# Patient Record
Sex: Female | Born: 1965 | Race: Black or African American | Hispanic: No | Marital: Single | Smoking: Never smoker
Health system: Southern US, Community
[De-identification: ages and names within clinical notes are randomized; demographics above are authoritative.]

## PROBLEM LIST (undated history)

## (undated) DIAGNOSIS — E559 Vitamin D deficiency, unspecified: Secondary | ICD-10-CM

## (undated) DIAGNOSIS — I10 Essential (primary) hypertension: Secondary | ICD-10-CM

## (undated) DIAGNOSIS — E7800 Pure hypercholesterolemia, unspecified: Secondary | ICD-10-CM

## (undated) DIAGNOSIS — E119 Type 2 diabetes mellitus without complications: Secondary | ICD-10-CM

## (undated) DIAGNOSIS — D649 Anemia, unspecified: Secondary | ICD-10-CM

## (undated) DIAGNOSIS — F32A Depression, unspecified: Secondary | ICD-10-CM

## (undated) DIAGNOSIS — R011 Cardiac murmur, unspecified: Secondary | ICD-10-CM

## (undated) DIAGNOSIS — F411 Generalized anxiety disorder: Secondary | ICD-10-CM

## (undated) DIAGNOSIS — R21 Rash and other nonspecific skin eruption: Secondary | ICD-10-CM

## (undated) DIAGNOSIS — J309 Allergic rhinitis, unspecified: Secondary | ICD-10-CM

## (undated) DIAGNOSIS — M1990 Unspecified osteoarthritis, unspecified site: Secondary | ICD-10-CM

## (undated) DIAGNOSIS — R51 Headache: Secondary | ICD-10-CM

## (undated) DIAGNOSIS — K219 Gastro-esophageal reflux disease without esophagitis: Secondary | ICD-10-CM

## (undated) HISTORY — DX: Headache: R51

## (undated) HISTORY — DX: Pure hypercholesterolemia, unspecified: E78.00

## (undated) HISTORY — DX: Gastro-esophageal reflux disease without esophagitis: K21.9

## (undated) HISTORY — DX: Anemia, unspecified: D64.9

## (undated) HISTORY — DX: Unspecified osteoarthritis, unspecified site: M19.90

## (undated) HISTORY — DX: Cardiac murmur, unspecified: R01.1

## (undated) HISTORY — DX: Vitamin D deficiency, unspecified: E55.9

## (undated) HISTORY — DX: Depression, unspecified: F32.A

## (undated) HISTORY — DX: Allergic rhinitis, unspecified: J30.9

## (undated) HISTORY — DX: Rash and other nonspecific skin eruption: R21

## (undated) HISTORY — DX: Generalized anxiety disorder: F41.1

---

## 1988-02-12 HISTORY — PX: TENDON REPAIR: SHX5111

## 1997-11-05 ENCOUNTER — Encounter: Payer: Self-pay | Admitting: Emergency Medicine

## 1997-11-05 ENCOUNTER — Emergency Department (HOSPITAL_COMMUNITY): Admission: EM | Admit: 1997-11-05 | Discharge: 1997-11-05 | Payer: Self-pay | Admitting: Emergency Medicine

## 2000-02-22 ENCOUNTER — Encounter: Payer: Self-pay | Admitting: Emergency Medicine

## 2000-02-22 ENCOUNTER — Emergency Department (HOSPITAL_COMMUNITY): Admission: EM | Admit: 2000-02-22 | Discharge: 2000-02-22 | Payer: Self-pay | Admitting: Emergency Medicine

## 2000-04-02 ENCOUNTER — Other Ambulatory Visit: Admission: RE | Admit: 2000-04-02 | Discharge: 2000-04-02 | Payer: Self-pay | Admitting: Gastroenterology

## 2000-04-02 ENCOUNTER — Encounter (INDEPENDENT_AMBULATORY_CARE_PROVIDER_SITE_OTHER): Payer: Self-pay | Admitting: Specialist

## 2000-05-05 ENCOUNTER — Other Ambulatory Visit: Admission: RE | Admit: 2000-05-05 | Discharge: 2000-05-05 | Payer: Self-pay | Admitting: Obstetrics and Gynecology

## 2002-05-26 ENCOUNTER — Emergency Department (HOSPITAL_COMMUNITY): Admission: EM | Admit: 2002-05-26 | Discharge: 2002-05-26 | Payer: Self-pay

## 2003-01-16 ENCOUNTER — Emergency Department (HOSPITAL_COMMUNITY): Admission: EM | Admit: 2003-01-16 | Discharge: 2003-01-16 | Payer: Self-pay | Admitting: Emergency Medicine

## 2003-10-19 ENCOUNTER — Emergency Department (HOSPITAL_COMMUNITY): Admission: EM | Admit: 2003-10-19 | Discharge: 2003-10-19 | Payer: Self-pay | Admitting: Emergency Medicine

## 2004-02-09 ENCOUNTER — Ambulatory Visit: Payer: Self-pay | Admitting: Pulmonary Disease

## 2004-07-23 ENCOUNTER — Ambulatory Visit: Payer: Self-pay | Admitting: Pulmonary Disease

## 2004-07-25 ENCOUNTER — Ambulatory Visit: Payer: Self-pay | Admitting: Pulmonary Disease

## 2004-09-17 ENCOUNTER — Emergency Department (HOSPITAL_COMMUNITY): Admission: EM | Admit: 2004-09-17 | Discharge: 2004-09-17 | Payer: Self-pay | Admitting: Emergency Medicine

## 2004-10-22 ENCOUNTER — Ambulatory Visit: Payer: Self-pay | Admitting: Pulmonary Disease

## 2004-10-29 ENCOUNTER — Ambulatory Visit: Payer: Self-pay | Admitting: Pulmonary Disease

## 2004-10-30 ENCOUNTER — Ambulatory Visit: Payer: Self-pay | Admitting: Internal Medicine

## 2004-11-14 ENCOUNTER — Ambulatory Visit: Payer: Self-pay | Admitting: Pulmonary Disease

## 2004-12-26 ENCOUNTER — Ambulatory Visit: Payer: Self-pay | Admitting: Pulmonary Disease

## 2005-01-15 ENCOUNTER — Emergency Department (HOSPITAL_COMMUNITY): Admission: EM | Admit: 2005-01-15 | Discharge: 2005-01-15 | Payer: Self-pay | Admitting: Emergency Medicine

## 2005-12-23 ENCOUNTER — Ambulatory Visit: Payer: Self-pay

## 2006-01-24 ENCOUNTER — Emergency Department (HOSPITAL_COMMUNITY): Admission: EM | Admit: 2006-01-24 | Discharge: 2006-01-24 | Payer: Self-pay | Admitting: Emergency Medicine

## 2006-11-04 ENCOUNTER — Ambulatory Visit: Payer: Self-pay | Admitting: Pulmonary Disease

## 2006-12-11 DIAGNOSIS — K219 Gastro-esophageal reflux disease without esophagitis: Secondary | ICD-10-CM

## 2006-12-11 DIAGNOSIS — F411 Generalized anxiety disorder: Secondary | ICD-10-CM

## 2006-12-11 DIAGNOSIS — J309 Allergic rhinitis, unspecified: Secondary | ICD-10-CM | POA: Insufficient documentation

## 2007-01-06 ENCOUNTER — Telehealth (INDEPENDENT_AMBULATORY_CARE_PROVIDER_SITE_OTHER): Payer: Self-pay | Admitting: *Deleted

## 2007-02-26 ENCOUNTER — Ambulatory Visit: Payer: Self-pay | Admitting: Pulmonary Disease

## 2007-03-05 ENCOUNTER — Telehealth: Payer: Self-pay | Admitting: Adult Health

## 2007-03-23 ENCOUNTER — Telehealth (INDEPENDENT_AMBULATORY_CARE_PROVIDER_SITE_OTHER): Payer: Self-pay | Admitting: *Deleted

## 2007-03-25 ENCOUNTER — Ambulatory Visit: Payer: Self-pay | Admitting: Pulmonary Disease

## 2007-06-18 ENCOUNTER — Telehealth (INDEPENDENT_AMBULATORY_CARE_PROVIDER_SITE_OTHER): Payer: Self-pay | Admitting: *Deleted

## 2007-06-25 ENCOUNTER — Telehealth: Payer: Self-pay | Admitting: Pulmonary Disease

## 2007-08-03 ENCOUNTER — Ambulatory Visit: Payer: Self-pay | Admitting: Pulmonary Disease

## 2007-11-09 ENCOUNTER — Ambulatory Visit: Payer: Self-pay | Admitting: Internal Medicine

## 2007-12-01 ENCOUNTER — Encounter (INDEPENDENT_AMBULATORY_CARE_PROVIDER_SITE_OTHER): Payer: Self-pay | Admitting: *Deleted

## 2007-12-17 ENCOUNTER — Telehealth (INDEPENDENT_AMBULATORY_CARE_PROVIDER_SITE_OTHER): Payer: Self-pay | Admitting: *Deleted

## 2007-12-22 ENCOUNTER — Telehealth (INDEPENDENT_AMBULATORY_CARE_PROVIDER_SITE_OTHER): Payer: Self-pay | Admitting: *Deleted

## 2007-12-25 ENCOUNTER — Ambulatory Visit: Payer: Self-pay | Admitting: Pulmonary Disease

## 2008-01-27 ENCOUNTER — Telehealth (INDEPENDENT_AMBULATORY_CARE_PROVIDER_SITE_OTHER): Payer: Self-pay | Admitting: *Deleted

## 2008-02-08 ENCOUNTER — Ambulatory Visit: Payer: Self-pay | Admitting: Pulmonary Disease

## 2008-02-08 DIAGNOSIS — R519 Headache, unspecified: Secondary | ICD-10-CM | POA: Insufficient documentation

## 2008-02-08 DIAGNOSIS — R51 Headache: Secondary | ICD-10-CM

## 2008-02-15 ENCOUNTER — Telehealth: Payer: Self-pay | Admitting: Adult Health

## 2008-02-16 ENCOUNTER — Ambulatory Visit: Payer: Self-pay | Admitting: Pulmonary Disease

## 2008-02-16 ENCOUNTER — Encounter: Payer: Self-pay | Admitting: Adult Health

## 2008-02-17 ENCOUNTER — Telehealth (INDEPENDENT_AMBULATORY_CARE_PROVIDER_SITE_OTHER): Payer: Self-pay | Admitting: *Deleted

## 2008-02-22 ENCOUNTER — Encounter: Payer: Self-pay | Admitting: Adult Health

## 2008-02-22 ENCOUNTER — Telehealth (INDEPENDENT_AMBULATORY_CARE_PROVIDER_SITE_OTHER): Payer: Self-pay | Admitting: *Deleted

## 2008-03-01 ENCOUNTER — Encounter: Payer: Self-pay | Admitting: Pulmonary Disease

## 2008-03-17 ENCOUNTER — Telehealth: Payer: Self-pay | Admitting: Pulmonary Disease

## 2008-08-11 ENCOUNTER — Telehealth (INDEPENDENT_AMBULATORY_CARE_PROVIDER_SITE_OTHER): Payer: Self-pay | Admitting: *Deleted

## 2008-08-16 ENCOUNTER — Ambulatory Visit: Payer: Self-pay | Admitting: Pulmonary Disease

## 2008-08-18 ENCOUNTER — Ambulatory Visit: Payer: Self-pay | Admitting: Pulmonary Disease

## 2008-08-18 DIAGNOSIS — E785 Hyperlipidemia, unspecified: Secondary | ICD-10-CM | POA: Insufficient documentation

## 2008-08-18 DIAGNOSIS — E7800 Pure hypercholesterolemia, unspecified: Secondary | ICD-10-CM

## 2008-08-18 DIAGNOSIS — E559 Vitamin D deficiency, unspecified: Secondary | ICD-10-CM

## 2008-08-18 DIAGNOSIS — D649 Anemia, unspecified: Secondary | ICD-10-CM | POA: Insufficient documentation

## 2008-08-18 LAB — CONVERTED CEMR LAB
ALT: 18 units/L (ref 0–35)
AST: 16 units/L (ref 0–37)
Albumin: 3.9 g/dL (ref 3.5–5.2)
Alkaline Phosphatase: 38 units/L — ABNORMAL LOW (ref 39–117)
BUN: 7 mg/dL (ref 6–23)
Basophils Absolute: 0.1 10*3/uL (ref 0.0–0.1)
Basophils Relative: 1.3 % (ref 0.0–3.0)
Bilirubin, Direct: 0.1 mg/dL (ref 0.0–0.3)
CO2: 30 meq/L (ref 19–32)
Calcium: 9.2 mg/dL (ref 8.4–10.5)
Chloride: 104 meq/L (ref 96–112)
Cholesterol: 205 mg/dL — ABNORMAL HIGH (ref 0–200)
Creatinine, Ser: 0.6 mg/dL (ref 0.4–1.2)
Direct LDL: 143.4 mg/dL
Eosinophils Absolute: 0.4 10*3/uL (ref 0.0–0.7)
Eosinophils Relative: 7.4 % — ABNORMAL HIGH (ref 0.0–5.0)
GFR calc non Af Amer: 138.99 mL/min (ref >60)
Glucose, Bld: 98 mg/dL (ref 70–99)
HCT: 35.8 % — ABNORMAL LOW (ref 36.0–46.0)
HDL: 53.2 mg/dL (ref >39.00)
Hemoglobin: 11.9 g/dL — ABNORMAL LOW (ref 12.0–15.0)
Lymphocytes Relative: 40.5 % (ref 12.0–46.0)
Lymphs Abs: 2.3 10*3/uL (ref 0.7–4.0)
MCHC: 33.3 g/dL (ref 30.0–36.0)
MCV: 83.5 fL (ref 78.0–100.0)
Monocytes Absolute: 0.5 10*3/uL (ref 0.1–1.0)
Monocytes Relative: 7.9 % (ref 3.0–12.0)
Neutro Abs: 2.5 10*3/uL (ref 1.4–7.7)
Neutrophils Relative %: 42.9 % — ABNORMAL LOW (ref 43.0–77.0)
Platelets: 369 10*3/uL (ref 150.0–400.0)
Potassium: 4.1 meq/L (ref 3.5–5.1)
RBC: 4.29 M/uL (ref 3.87–5.11)
RDW: 12.5 % (ref 11.5–14.6)
Sodium: 140 meq/L (ref 135–145)
TSH: 1.09 microintl units/mL (ref 0.35–5.50)
Total Bilirubin: 1 mg/dL (ref 0.3–1.2)
Total CHOL/HDL Ratio: 4
Total Protein: 7.2 g/dL (ref 6.0–8.3)
Triglycerides: 55 mg/dL (ref 0.0–149.0)
VLDL: 11 mg/dL (ref 0.0–40.0)
Vit D, 25-Hydroxy: 10 ng/mL — ABNORMAL LOW (ref 30–89)
WBC: 5.8 10*3/uL (ref 4.5–10.5)

## 2008-09-30 ENCOUNTER — Telehealth: Payer: Self-pay | Admitting: Internal Medicine

## 2008-10-03 ENCOUNTER — Telehealth: Payer: Self-pay | Admitting: Adult Health

## 2009-04-06 ENCOUNTER — Telehealth (INDEPENDENT_AMBULATORY_CARE_PROVIDER_SITE_OTHER): Payer: Self-pay | Admitting: *Deleted

## 2009-05-11 ENCOUNTER — Telehealth: Payer: Self-pay | Admitting: Pulmonary Disease

## 2009-05-15 ENCOUNTER — Encounter: Payer: Self-pay | Admitting: Pulmonary Disease

## 2009-05-15 ENCOUNTER — Telehealth: Payer: Self-pay | Admitting: Pulmonary Disease

## 2009-05-17 ENCOUNTER — Encounter: Payer: Self-pay | Admitting: Pulmonary Disease

## 2009-05-17 ENCOUNTER — Telehealth: Payer: Self-pay | Admitting: Pulmonary Disease

## 2009-06-14 ENCOUNTER — Telehealth: Payer: Self-pay | Admitting: Pulmonary Disease

## 2009-07-24 ENCOUNTER — Telehealth: Payer: Self-pay | Admitting: Pulmonary Disease

## 2009-08-18 ENCOUNTER — Ambulatory Visit: Payer: Self-pay | Admitting: Pulmonary Disease

## 2009-08-18 ENCOUNTER — Encounter: Payer: Self-pay | Admitting: Pulmonary Disease

## 2009-08-24 ENCOUNTER — Encounter: Payer: Self-pay | Admitting: Pulmonary Disease

## 2009-08-24 ENCOUNTER — Telehealth: Payer: Self-pay | Admitting: Adult Health

## 2009-08-24 ENCOUNTER — Telehealth (INDEPENDENT_AMBULATORY_CARE_PROVIDER_SITE_OTHER): Payer: Self-pay | Admitting: *Deleted

## 2009-08-28 ENCOUNTER — Encounter: Payer: Self-pay | Admitting: Adult Health

## 2009-08-28 ENCOUNTER — Telehealth (INDEPENDENT_AMBULATORY_CARE_PROVIDER_SITE_OTHER): Payer: Self-pay | Admitting: *Deleted

## 2009-11-06 ENCOUNTER — Telehealth (INDEPENDENT_AMBULATORY_CARE_PROVIDER_SITE_OTHER): Payer: Self-pay | Admitting: *Deleted

## 2009-11-06 ENCOUNTER — Encounter: Payer: Self-pay | Admitting: Pulmonary Disease

## 2009-11-20 ENCOUNTER — Encounter: Payer: Self-pay | Admitting: Pulmonary Disease

## 2009-12-01 ENCOUNTER — Encounter: Payer: Self-pay | Admitting: Pulmonary Disease

## 2009-12-18 ENCOUNTER — Telehealth (INDEPENDENT_AMBULATORY_CARE_PROVIDER_SITE_OTHER): Payer: Self-pay | Admitting: *Deleted

## 2009-12-27 ENCOUNTER — Telehealth: Payer: Self-pay | Admitting: Pulmonary Disease

## 2010-02-14 ENCOUNTER — Ambulatory Visit: Admit: 2010-02-14 | Payer: Self-pay | Admitting: Pulmonary Disease

## 2010-02-15 ENCOUNTER — Telehealth: Payer: Self-pay | Admitting: Pulmonary Disease

## 2010-02-22 ENCOUNTER — Telehealth (INDEPENDENT_AMBULATORY_CARE_PROVIDER_SITE_OTHER): Payer: Self-pay | Admitting: *Deleted

## 2010-03-13 NOTE — Letter (Signed)
Summary: Out of Work  [facility]  [address]   [place], [place] [address]   Phone: [phone]  Fax: [phone]    August 24, 2009   Employee:  Margaretha Glassing A Gertz    To Whom It May Concern:   For Medical reasons, please excuse the above named employee from work for the following dates:  Start:   08-22-2009  End:   08-22-2009  If you need additional information, please feel free to contact our office.         Sincerely,      Rubye Oaks, NP

## 2010-03-13 NOTE — Letter (Signed)
Summary: Out of Work  [facility]  [address]   [place], [place] [address]   Phone: [phone]  Fax: [phone]    November 06, 2009   Employee:  Brenda [name]    To Whom It May Concern:   For Medical reasons, please excuse the above named employee from work for the following date(s):    Monday November 06, 2009    Pt may return to work Tuesday November 07, 2009.    If you need additional information, please feel free to contact our office.         Sincerely,    Alroy Dust, M.D.  Appended Document: Out of Work per SN-----pt never returned for her fasting labs after her 08-21-2009 ov-----no more doctorns letters for out of work---she will require ov for all her complaints per SN

## 2010-03-13 NOTE — Letter (Signed)
Summary: Out of Work  [facility]  [address]   [place], [place] [address]   Phone: [phone]  Fax: [phone]    May 15, 2009   Employee:  Margaretha Glassing A Wiltsey    To Whom It May Concern:   For Medical reasons, please excuse the above named employee from work for the following dates:  Start:   05/15/09  End:   pt may return to work on 05/17/09  If you need additional information, please feel free to contact our office.         Sincerely,   Alroy Dust, MD

## 2010-03-13 NOTE — Progress Notes (Signed)
Summary: pt requesting tussionex refill  Phone Note From Pharmacy   Caller: [facility]  [address] # [id number]* Summary of Call: pharmacy sent a fax and stated that the pt is requesting refill of the tussionex that was sent in on 10-03-08, pharmacy stated that she never picked this up so they need a new rx for this.  is this ok to call in for the $4oz  1 tsp every 12 hours as needed for cough?  please advise.  thanks Randell Loop CMA  August 24, 2009 10:22 AM   Follow-up for Phone Call        yes that is fine.  tussionex #4oz 1 tsp two times a day as needed cough, may make you sleepy, no refills  Follow-up by: Tammy Parrett NP,  August 24, 2009 10:49 AM    New/Updated Medications: Sandria Senter ER 8-10 MG/5ML LQCR (CHLORPHENIRAMINE-HYDROCODONE) 1 tsp two times a day as needed for cough.  May make you sleepy Prescriptions: TUSSIONEX PENNKINETIC ER 8-10 MG/5ML LQCR (CHLORPHENIRAMINE-HYDROCODONE) 1 tsp two times a day as needed for cough.  May make you sleepy  #4 oz x o   Entered by:   Arman Filter LPN   Authorized by:   Rubye Oaks NP   Signed by:   Arman Filter LPN on 16/11/9602   Method used:   Telephoned to ...       [facility]  [address] # [id number]* (retail)       [address]       [place], [place]  [address]       Ph: [phone] or [phone]       Fax: [phone]   RxID:   [id number]

## 2010-03-13 NOTE — Progress Notes (Signed)
Summary: congested  Phone Note Call from Patient Call back at [phone]   Caller: Patient Call For: Tanganika Barradas Summary of Call: pt congested sore throat headache [facility] [address] Initial call taken by: Rickard Patience,  May 11, 2009 9:25 AM  Follow-up for Phone Call        called, pt back at number provided aobve - [phone] - Left message for pt to call office back.  Called pt's home number - no answer and unable to leave message.  Called pt's work number - [employer] Jones RN  May 11, 2009 9:37 AM  pt returned call. call her at [phone] today. Tivis Ringer, CNA  May 12, 2009 9:16 AM   Additional Follow-up for Phone Call Additional follow up Details #1::        pt c/o chest congestion, productive cough with green phlegm, head congestion, headache, and ear ache x 3 days. States she has been taking mucinex with no relief. Please advise. Carron Curie CMA  May 12, 2009 9:20 AM allergies: codeine    Additional Follow-up for Phone Call Additional follow up Details #2::    per SN----ok for pt to have augmentin 875mg   #14   1 by mouth two times a day  and cont the mucinex 2 by mouth two times a day with fluids  and use the nasal saline spray as needed .  thanks Randell Loop CMA  May 12, 2009 12:11 PM   rx sent. pt advised of recs. Carron Curie CMA  May 12, 2009 12:15 PM   New/Updated Medications: AUGMENTIN 875-125 MG TABS (AMOXICILLIN-POT CLAVULANATE) Take 1 tablet by mouth two times a day Prescriptions: AUGMENTIN 875-125 MG TABS (AMOXICILLIN-POT CLAVULANATE) Take 1 tablet by mouth two times a day  #14 x 0   Entered by:   Carron Curie CMA   Authorized by:   Michele Mcalpine MD   Signed by:   Carron Curie CMA on 05/12/2009   Method used:   Electronically to        [facility]  [address] # [id number]* (retail)       [address]       [place], [place]  [address]       Ph: [phone] or [phone]       Fax: [phone]   RxID:    [id number]

## 2010-03-13 NOTE — Progress Notes (Signed)
Summary: OV to fill out FMLA forms  Phone Note Call from Patient Call back at [phone]   Caller: Patient Call For: nadel Summary of Call: Wants to know the status of her FMLA papers. Initial call taken by: [name],  December 18, 2009 9:35 AM  Follow-up for Phone Call        spoke with [employer] and they state they sent FMLA paperwork to SN on 12-06-09. The FMLA is under pt name Brenda [name] for [name] to care for her. Leigh please advise on status of these forms. Thanks. Carron Curie CMA  December 18, 2009 10:37 AM   Additional Follow-up for Phone Call Additional follow up Details #1::        she will need ov for these to be filled out. thanks Randell Loop CMA  December 18, 2009 10:43 AM  Please advise on dates and times pt can be worked in with SN.Michel Bickers [facility]  December 18, 2009 11:04 AM 11-15 at 3:30 thanks Randell Loop [facility]  December 18, 2009 11:18 AM     Additional Follow-up for Phone Call Additional follow up Details #2::    LMOMTCB. Need to sch appt for FMLA papers.Michel Bickers CMA  December 18, 2009 11:30 AM LMTCBx2 to set appt to see SN to have paperwork completd. Carron Curie CMA  December 19, 2009 11:36 AM  LMTCbx3. pt needs appt with SN. Carron Curie CMA  December 20, 2009 10:37 AM   The patient is aware of date and time of appt with SN to have FMLA forms filled out Tues., 12/26/2009 @ 3:30pm w/ SN.Marland KitchenMichel Bickers [facility]  December 20, 2009 10:47 AM

## 2010-03-13 NOTE — Progress Notes (Signed)
Summary: rx's denied -  Phone Note Refill Request Call back at Home Phone [phone] Message from:  Patient on July 24, 2009 3:53 PM  Refills Requested: Medication #1:  PRAVASTATIN SODIUM 40 MG TABS take 1 tab by mouth at bedtime for cholesterol...   Dosage confirmed as above?Dosage Confirmed   Supply Requested: 23 days   Notes: telephoned  Medication #2:  XANAX 0.5 MG  TABS take one tablet two times a day as needed for nerves...   Dosage confirmed as above?Dosage Confirmed   Supply Requested: 23 days   Notes: telephoned pt called asking why her scripts were denied.  pt last seen 08-2008 with multiple noshows since.  informed pt why rx's were denied.  appt made w/ SN 07-27-09 @ 1530 with the understanding that her meds will be denied again if she misses the appt.  pt verbalized her understanding.  Next Appointment Scheduled: 07-27-09 Initial call taken by: Boone Master CNA/MA,  July 24, 2009 3:56 PM  Follow-up for Phone Call        pt called again, states that she cannot make it on thurs b/c her job already has 2 people off on that day.  pt states she can come any day next week, any time if we can resch.  will forward to leigh. Boone Master CNA/MA  July 24, 2009 3:57 PM   per TD, okay for 08-17-09 @ 1530.  LMOM TCB. Boone Master CNA/MA  July 24, 2009 4:04 PM   lmtcb Vernie Murders  July 25, 2009 1:24 PM   Additional Follow-up for Phone Call Additional follow up Details #1::        pt returned  call to Rickard Patience, scheduler, stating that she will NOT be able to make the appt next week.  per TD, no appt no refills.  [phone]. Boone Master CNA/MA  July 25, 2009 2:52 PM   Pt states she was not advised of 7-7 appt and this appt will be fine for her. So pt scheduled for 08-17-09 appt at 3:30. I called pharmacy and advised that we need to change refills for enough to last until 08-17-09 appt and no extra. Pharmacists changed refill amount to last until 08-17-09.  Carron Curie CMA   July 25, 2009 3:07 PM

## 2010-03-13 NOTE — Progress Notes (Signed)
Summary: refill  Phone Note Call from Patient Call back at [phone]   Caller: Patient Call For: nadel Summary of Call: Pt wants a refill on alprazolam 0.5mg , made an appt. for May, said she can't come in before then, because she just started a new job and her probationary period won't be up until 4/6. Please advise.[facility] [address] Initial call taken by: [name],  April 06, 2009 8:15 AM  Follow-up for Phone Call        please advise if ok to send in refill. Carron Curie CMA  April 06, 2009 9:53 AM   ok to fill the rx for alprazolam this time with 2 refills.  she must keep her appt in may or the med will not be able to be refilled.  thanks Randell Loop CMA  April 06, 2009 10:55 AM   rx sent.  LMTCB to advised pt must keep f/u appt. Carron Curie CMA  April 06, 2009 11:05 AM   Additional Follow-up for Phone Call Additional follow up Details #1::        lmam that meds had been called in and that she must keep her appt in may to get more refills and told to call with questions Additional Follow-up by: Lacinda Axon,  April 06, 2009 2:25 PM    Prescriptions: XANAX 0.5 MG  TABS (ALPRAZOLAM) take one tablet two times a day as needed for nerves...  #60 x 2   Entered by:   Carron Curie CMA   Authorized by:   Michele Mcalpine MD   Signed by:   Carron Curie CMA on 04/06/2009   Method used:   Telephoned to ...       [facility]  [address] # [id number]* (retail)       [address]       [place], [place]  [address]       Ph: [phone] or [phone]       Fax: [phone]   RxID:   [id number]

## 2010-03-13 NOTE — Progress Notes (Signed)
Summary: nos appt  Phone Note Call from Patient   Caller: [email or web address]  Call For: Sarely Stracener Summary of Call: In ref to nos from 11/15, pt has appt for 02/14/2010. Initial call taken by: [name],  December 27, 2009 9:05 AM

## 2010-03-13 NOTE — Progress Notes (Signed)
Summary: note/ missed work days  Phone Note Call from Patient   Caller: Patient Call For: tammy parrett Summary of Call: needs an "excuse from work" note faxed. (needs this for last thurs and fri). i asked pt 3 x what # to fax this to and where it was going but she kept telling me that "they already have this information". pt # [phone] Initial call taken by: Tivis Ringer, CNA,  August 28, 2009 12:20 PM  Follow-up for Phone Call        TP---pt called and we did a note for her on 7-12 and she stated that she was going back to work on wed.  now she is calling and needs another note for thursday and friday...is this ok to do??  please advise. thanks Randell Loop CMA  August 28, 2009 1:37 PM   Additional Follow-up for Phone Call Additional follow up Details #1::        yes Additional Follow-up by: Rubye Oaks NP,  August 28, 2009 1:50 PM    Additional Follow-up for Phone Call Additional follow up Details #2::    work note faxed to [phone].  pt aware. Arman Filter LPN  August 28, 2009 2:36 PM

## 2010-03-13 NOTE — Progress Notes (Signed)
Summary: rsc nos appt  Phone Note Call from Patient   Caller: [email or web address]  Call For: Lamel Mccarley Summary of Call: ATC pt home phone is disconnected. Called her daughter (contact number) and LMOMTCB. Initial call taken by: [name],  Jun 14, 2009 2:21 PM

## 2010-03-13 NOTE — Progress Notes (Signed)
Summary: diarrhea/vomiting--rx for lomotil and phenergan  Phone Note Call from Patient Call back at [phone]   Caller: Patient Call For: nadel Reason for Call: Talk to Nurse Summary of Call: Pt c/o of diarrhea, throwing up since Saturday, has taken Imodium AD, isn't working wants something called in, pls advise.[facility] Initial call taken by: [name],  November 06, 2009 8:26 AM  Follow-up for Phone Call        called and spoke with pt.  pt states she has been throwing up and having diarrhea since Saturday 11/04/2009.  Pt has been taking Imodium AD with no relief of symptoms.  Pt states she feels "drained."  Pt has been trying to drink liquids but still unable to keep anything down.  Pt unsure if she has a fever or not.  Pt requests rx to help with diarrhea/nausea/vomiting.  Pt also requests a doctors note as she was unable to go into work today. Please advise  Arman Filter LPN  November 06, 2009 9:17 AM   allergies: codeine  Additional Follow-up for Phone Call Additional follow up Details #1::        per SN---phenergan suppos  25mg   #12   1 inserted every 4 hours as needed for nausea and lomotil  #12  1 by mouth every 4 hours as needed for watery diarrhea.  thanks. also ok for dr note. Randell Loop [facility]  November 06, 2009 11:25 AM     Additional Follow-up for Phone Call Additional follow up Details #2::    called and spoke with pt.  pt aware rx sent to pharmacy.  and dr note faxed to her work. Aundra Millet Reynolds LPN  November 06, 2009 11:27 AM   New/Updated Medications: PROMETHAZINE HCL 25 MG SUPP (PROMETHAZINE HCL) insert 1 every 4 hours as needed for nausea LOMOTIL 2.5-0.025 MG TABS (DIPHENOXYLATE-ATROPINE) 1 every 4 hours as needed for watery diarrhea Prescriptions: LOMOTIL 2.5-0.025 MG TABS (DIPHENOXYLATE-ATROPINE) 1 every 4 hours as needed for watery diarrhea  #12 x 0   Entered by:   Arman Filter LPN   Authorized by:   Michele Mcalpine MD   Signed by:   Arman Filter LPN on 56/21/3086   Method used:   Telephoned to ...       [facility]  [address] # 11350* (retail)       [address]       [place], [place]  [address]       Ph: [phone] or [phone]       Fax: [phone]   RxID:   [id number] PROMETHAZINE HCL 25 MG SUPP (PROMETHAZINE HCL) insert 1 every 4 hours as needed for nausea  #12 x 0   Entered by:   Arman Filter LPN   Authorized by:   Michele Mcalpine MD   Signed by:   Arman Filter LPN on 18/84/1660   Method used:   Telephoned to ...       [facility]  [address] # 11350* (retail)       [address]       [place], [place]  [address]       Ph: [phone] or [phone]       Fax: [phone]   RxID:   [id number]

## 2010-03-13 NOTE — Letter (Signed)
Summary: Out of Work  [facility]  [address]   [place], [place] [address]   Phone: [phone]  Fax: [phone]    May 17, 2009   Employee:  Margaretha Glassing A Clapper    To Whom It May Concern:   For Medical reasons, please excuse the above named employee from work for the following dates:  Start:   05-15-09  End:   pt may return to work on 05-18-09  If you need additional information, please feel free to contact our office.         Sincerely,    Alroy Dust, MD

## 2010-03-13 NOTE — Letter (Signed)
Summary: [facility]   Imported By: Sherian Rein 12/04/2009 07:16:31  _____________________________________________________________________  External Attachment:    Type:   Image     Comment:   External Document

## 2010-03-13 NOTE — Progress Notes (Signed)
Summary: note for work  Phone Note Call from Patient Call back at [employer] [phone]   Caller: Patient Call For: Jeanpierre Thebeau Reason for Call: Talk to Nurse Summary of Call: pt is still sick, has diarrhea now and can't return to work today.  Can we fax a new note to employer for her to be out of work today also?  Fax# is [phone] Attn: Patrice Initial call taken by: Eugene Gavia,  May 17, 2009 9:18 AM  Follow-up for Phone Call        on 05/15/09 pt requested a work not to return on 05/17/09 but since then she has developed diarrhea and states she cannot return to work today and wants a n extension on the work note to excuse her from work through today and return tomorrow. Pt states congestion and cough has improved.   Please advise if ok to extend work note. Carron Curie CMA  May 17, 2009 10:24 AM   Additional Follow-up for Phone Call Additional follow up Details #1::        per SN---ok to extend the work note for tomorrow.  thanks Randell Loop CMA  May 17, 2009 10:26 AM   new work note printed and faxed. Pt advised. Carron Curie CMA  May 17, 2009 10:36 AM

## 2010-03-13 NOTE — Letter (Signed)
Summary: Out of Work  [facility]  [address]   [place], [place] [address]   Phone: [phone]  Fax: [phone]    August 28, 2009   Employee:  Brenda [name]    To Whom It May Concern:   For Medical reasons, please excuse the above named employee from work for the following dates:  Start:   Thursday August 24, 2009  End:   Friday August 25, 2009  If you need additional information, please feel free to contact our office.         Sincerely,       Rubye Oaks, NP

## 2010-03-13 NOTE — Letter (Signed)
Summary: Out of Work  [facility]  [address]   [place], [place] [address]   Phone: [phone]  Fax: [phone]    August 18, 2009   Employee:  Margaretha Glassing A Polo    To Whom It May Concern:   For Medical reasons, please excuse the above named employee from work for the following dates:  Start:   08-18-2009  End:   08-18-2009  If you need additional information, please feel free to contact our office.         Sincerely,      Lonzo Cloud. Elayne Snare. D.

## 2010-03-13 NOTE — Assessment & Plan Note (Signed)
Summary: rov/meds/apc   CC:  Yearly ROV & CPX....  History of Present Illness: 45 y/o BF here for a follow up visit... she has multiple medical problems as noted below... Followed for general medical purposes w/ hx of Hyperchol, GERD, HA's, anxiety, and mild anemia...    ~  August 18, 2008:  she hasn't had any hives or allergy probs since getting a DepoMedrol shot, she says... here for refills on her Midrin, Zoloft, Xanax... she had fasting labs done yest w/ LDL= 143, Hg= 11.9, Vit D= 10... **we discussed starting PRAVACHOL40, VitD 50K weekly, and MVI w/ Fe...   ~  August 18, 2009:  yearly ROV, doing well, no new complaints or concerns, needs meds refilled> states she's been taking the Prav40 & Vit D 50K/wk... not fasting today & she will need to ret for fasting labs...  c/o anxiety due to "Momma drinking heavily"...    Current Problems:   ALLERGIC RHINITIS (ICD-477.9) - on Zyrtek, Pepcid, Benedryl OTC as needed, and HYDROXYZINE 25mg  for itching...  HYPERCHOLESTEROLEMIA (ICD-272.0) - on PRAVASTATIN 40mg /d, +diet Rx...  ~  FLP 6/06 showed TChol 191, TG 64, HDL 48, LDL 131... she prefers diet Rx.  ~  FLP 7/10 showed TChol 205, TG 55, HDL 53, LDL 143... she agrees to try PRAV40.  ~  FLP 7/11 on Prav40 = pending  GERD (ICD-530.81) - on PRILOSEC OTC 20mg /d> "it's doin good"...  ~  EGD 2/02 by DrPatterson showed HH, GERD, no ulcer etc... Rx w/ PPI.  ~  Colonoscopy 2/02 by DrPatterson was normal- no signs of IBD...  ~  7/10: switched to PRILOSEC OTC 20mg /d for $$ reasons.  HEADACHE, MIXED (ICD-784.0) - prev on MIDRIN & now using ESTylenol Prn...  ANXIETY DISORDER, GENERALIZED (ICD-300.02) - she takes ZOLOFT 50mg /d,  & XANAX 0.5mg Bid...   ~  7/10: she states these meds working well and wants to continue the same...  ~  7/11:  requesting refills Zoloft (mood improved), and Xanax ("there's alot goin on, Momma is drinking heavily").  VITAMIN D DEFICIENCY (ICD-268.9) - on Vit D 50K weekly...  ~   labs 7/10 showed Vit d level = 10... rec> start 50K weekly x 45mo & repeat level.  ~  labd 7/11 = pending  SKIN RASH (ICD-782.1) - ??? etiology- refer for allergy testing...  ANEMIA, MILD (ICD-285.9)  ~  labs 9/06 showed Hg= 12.8, MCV= 83  ~  labs 7/10 showed Hg= 11.9, MCV= 84... we discussed MVI w/ Fe.  ~  labs 7/11 = pending  Health Maintenance - she states that she will call GYN= DrHarper for PAP, Mammogram, etc...   Preventive Screening-Counseling & Management  Alcohol-Tobacco     Smoking Status: never  Allergies: 1)  Codeine Phosphate (Codeine Phosphate)  Comments:  Nurse/Medical Assistant: The patient's medications and allergies were reviewed with the patient and were updated in the Medication and Allergy Lists.  Past History:  Past Medical History: ALLERGIC RHINITIS (ICD-477.9) HYPERCHOLESTEROLEMIA (ICD-272.0) GERD (ICD-530.81) HEADACHE, MIXED (ICD-784.0) ANXIETY DISORDER, GENERALIZED (ICD-300.02) VITAMIN D DEFICIENCY (ICD-268.9) SKIN RASH (ICD-782.1) ANEMIA, MILD (ICD-285.9)  Past Surgical History: S/P left index finger tendon repair 1990  Family History: Reviewed history from 08/18/2008 and no changes required. 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  The patient denies fever, chills, sweats, anorexia, fatigue, weakness, malaise, weight loss, sleep disorder, blurring, diplopia, eye irritation, eye discharge, vision loss, eye pain, photophobia, earache, ear discharge, tinnitus, decreased hearing, nasal congestion, nosebleeds,  sore throat, hoarseness, chest pain, palpitations, syncope, orthopnea, PND, peripheral edema, cough, dyspnea at rest, excessive sputum, hemoptysis, wheezing, pleurisy, nausea, vomiting, diarrhea, constipation, change in bowel habits, abdominal pain, melena, hematochezia, jaundice, gas/bloating, indigestion/heartburn, dysphagia, odynophagia, dysuria, hematuria, urinary frequency, urinary hesitancy, nocturia, incontinence, joint pain, joint swelling, muscle cramps, muscle weakness, arthritis, sciatica, restless legs, leg pain at night, leg pain with exertion, rash, itching, dryness, suspicious lesions, paralysis, paresthesias, seizures, tremors, vertigo, transient blindness, frequent falls, difficulty walking, depression, anxiety, memory loss, confusion, cold intolerance, heat intolerance, polydipsia, polyphagia, polyuria, unusual weight change, abnormal bruising, bleeding, enlarged lymph nodes, urticaria, hay fever, and recurrent infections.    Vital Signs:  Patient profile:   45 year old female Height:      60 inches Weight:      150 pounds BMI:     29.40 O2 Sat:      100 % on Room air Temp:     98.8 degrees F oral Pulse rate:   93 / minute BP sitting:   150 / 82  (right arm) Cuff size:   regular  Vitals Entered By: Randell Loop CMA (August 18, 2009 9:35 AM)  O2 Sat at Rest %:  100 O2 Flow:  Room air CC: Yearly ROV & CPX... Is Patient Diabetic? No Pain Assessment Patient in pain? yes      Onset of pain  hands and knees x 1 year Comments meds updated today with pt   Physical Exam  Additional Exam:  WD, WN, 44 y/o BF in NAD... GENERAL:  Alert & oriented; pleasant & cooperative... HEENT:  [place]/AT, EOM-wnl, PERRLA, EACs-clear, TMs-wnl, NOSE-clear, THROAT-clear & wnl. NECK:  Supple w/ fairROM; no JVD; normal carotid impulses w/o bruits; no thyromegaly or nodules palpated; no lymphadenopathy. CHEST:  Clear to P & A; without wheezes/ rales/ or rhonchi heard... HEART:  Regular Rhythm; without  murmurs/ rubs/ or gallops detected... ABDOMEN:  Soft & nontender; normal bowel sounds; no organomegaly or masses palpated... EXT: without deformities, mild arthritic changes; no varicose veins/ venous insuffic/ or edema. NEURO:  CN's intact; motor testing normal; sensory testing normal; gait normal & balance OK. DERM:  No lesions noted; no rash etc...    CXR  Procedure date:  08/18/2009  Findings:      CHEST - 2 VIEW Comparison: None.   Findings: Minimal mid thoracic spondylosis. Midline trachea. Normal heart size and mediastinal contours. No pleural effusion or pneumothorax.  Clear lungs.   IMPRESSION: No acute disease.   Read By:  Consuello Bossier,  M.D.    EKG  Procedure date:  08/18/2009  Findings:      Normal sinus rhythm with rate of:  86/ min... Tracing is WNL, NAD...  SN   Impression & Recommendations:  Problem # 1:  PHYSICAL EXAMINATION (ICD-V70.0) She will ret for FASTING labs... Orders: T-2 View CXR (71020TC) 12 Lead EKG (12 Lead EKG)  Problem # 2:  HYPERCHOLESTEROLEMIA (ICD-272.0) FLP pending> on the Prav40... Her updated medication list for this problem includes:    Pravastatin Sodium 40 Mg Tabs (Pravastatin sodium) .Marland Kitchen... Take  1 tab by mouth at bedtime for cholesterol...  Problem # 3:  GERD (ICD-530.81) Doing satis on the Prilosec OTC... Her updated medication list for this problem includes:    Prilosec Otc 20 Mg Tbec (Omeprazole magnesium) .Marland Kitchen... Take 1 tab by mouth once daily...  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 The following medications were removed from the medication list:    Midrin 325-65-100 Mg Caps (Apap-isometheptene-dichloral) .Marland Kitchen... Take 1 tab by mouth every 6 h as needed for headache...  Problem # 5:  ANXIETY DISORDER, GENERALIZED (ICD-300.02) She relates all this to "Momma" & we discussed her situation>  meds refilled. Her updated medication list for this problem includes:    Zoloft 50 Mg Tabs  (Sertraline hcl) .Marland Kitchen... Take 1 tablet by mouth once a day    Xanax 0.5 Mg Tabs (Alprazolam) .Marland Kitchen... Take one tablet two times a day as needed for nerves...    Hydroxyzine Hcl 25 Mg Tabs (Hydroxyzine hcl) .Marland Kitchen... Take 1 tab by mouth every 6 h as needed for itching...  Problem # 6:  VITAMIN D DEFICIENCY (ICD-268.9) F/u Vit D level pending labs... continuing 50K per week for now...  Complete Medication List: 1)  Pravastatin Sodium 40 Mg Tabs (Pravastatin sodium) .... Take 1 tab by mouth at bedtime for cholesterol... 2)  Prilosec Otc 20 Mg Tbec (Omeprazole magnesium) .... Take 1 tab by mouth once daily.Marland KitchenMarland Kitchen 3)  Zoloft 50 Mg Tabs (Sertraline hcl) .... Take 1 tablet by mouth once a day 4)  Xanax 0.5 Mg Tabs (Alprazolam) .... Take one tablet two times a day as needed for nerves.Marland KitchenMarland Kitchen 5)  Hydroxyzine Hcl 25 Mg Tabs (Hydroxyzine hcl) .... Take 1 tab by mouth every 6 h as needed for itching... 6)  Womens Multivitamin Plus Tabs (Multiple vitamins-minerals) .... Take 1 tab by mouth once daily.Marland KitchenMarland Kitchen 7)  Vitamin D (ergocalciferol) 50000 Unit Caps (Ergocalciferol) .... Take 1 cap by mouth each week...  Patient Instructions: 1)  Today we updated your med list- see below.... 2)  We refilled your meds as requested... 3)  Today we did your screening CXR & EKG... 4)  Please return to our lab one morning next week for your FASTING blood work... then call the "phone tree" in a few days for your lab results.Marland KitchenMarland Kitchen 5)  Call for any questions.Marland KitchenMarland Kitchen 6)  You will need a follow up visit in 6 months to refill the Xanax & Zoloft... Prescriptions: VITAMIN D (ERGOCALCIFEROL) 50000 UNIT CAPS (ERGOCALCIFEROL) take 1 cap by mouth each week...  #4 x 11   Entered and Authorized by:   Michele Mcalpine MD   Signed by:   Michele Mcalpine MD on 08/18/2009   Method used:   Print then Give to Patient   RxID:   [id number] HYDROXYZINE HCL 25 MG TABS (HYDROXYZINE HCL) take 1 tab by mouth every 6 H as needed for itching...  #100 x 6   Entered and  Authorized by:   Michele Mcalpine MD   Signed by:   Michele Mcalpine MD on 08/18/2009   Method used:   Print then Give to Patient   RxID:   [id number] XANAX 0.5 MG  TABS (ALPRAZOLAM) take one tablet two times a day as needed for nerves...  #60 x 6   Entered and Authorized by:   Michele Mcalpine MD   Signed by:   Michele Mcalpine MD on 08/18/2009   Method used:   Print then Give to Patient  RxID:   [id number] ZOLOFT 50 MG  TABS (SERTRALINE HCL) Take 1 tablet by mouth once a day  #30 x 6   Entered and Authorized by:   Michele Mcalpine MD   Signed by:   Michele Mcalpine MD on 08/18/2009   Method used:   Print then Give to Patient   RxID:   [id number] PRAVASTATIN SODIUM 40 MG TABS (PRAVASTATIN SODIUM) take 1 tab by mouth at bedtime for cholesterol...  #30 x 11   Entered and Authorized by:   Michele Mcalpine MD   Signed by:   Michele Mcalpine MD on 08/18/2009   Method used:   Print then Give to Patient   RxID:   [id number]    Immunization History:  Influenza Immunization History:    Influenza:  historical (12/01/2008)   CardioPerfect ECG  ID: [id number] Patient: Pion, Margaretha Glassing A DOB: 19-Dec-1965 Age: 45 Years Old Sex: Female Race: Black Physician: Inita Uram Technician: Randell Loop CMA Height: 60 Weight: 150 Status: Unconfirmed Past Medical History:  ALLERGIC RHINITIS (ICD-477.9) HYPERCHOLESTEROLEMIA (ICD-272.0) GERD (ICD-530.81) HEADACHE, MIXED (ICD-784.0) ANXIETY DISORDER, GENERALIZED (ICD-300.02) VITAMIN D DEFICIENCY (ICD-268.9) SKIN RASH (ICD-782.1) ANEMIA, MILD (ICD-285.9)   Recorded: 08/18/2009 10:26 AM P/PR: 108 ms / 133 ms - Heart rate (maximum exercise) QRS: 77 QT/QTc/QTd: 340 ms / 386 ms / 50 ms - Heart rate (maximum exercise)  P/QRS/T axis: 59 deg / 86 deg / 32 deg - Heart rate (maximum exercise)  Heartrate: 86 bpm  Interpretation:  Normal sinus rhythm with rate of:  86/ min... Tracing is WNL, NAD...  SN

## 2010-03-13 NOTE — Progress Notes (Signed)
Summary: needs note for work/ sick  Phone Note Call from Patient   Caller: Patient Call For: Jenese Mischke Summary of Call: pt still taking abx. still congested. requests a note to keep her out of work for 2 days. she will get someone to pick this up for her. pt # [phone] Initial call taken by: Tivis Ringer, CNA,  May 15, 2009 12:55 PM  Follow-up for Phone Call        pt called back, can you fax note stright to her job?  Fax# [phone] Attn: Rodell Perna    called and spoke with pt. pt states she had called last thursday 05-11-2009 and was given abx to take for 7 days.  pt states she is currently on abx and following SN's recs to also take mucinex and nasal saline rinses.  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  Please advise.  Thanks. Arman Filter LPN  May 16, 7251 2:04 PM  Follow-up by: Eugene Gavia,  May 15, 2009 1:39 PM  Additional Follow-up for Phone Call Additional follow up Details #1::        yes this is ok to do for her.  thanks Randell Loop CMA  May 15, 2009 2:09 PM   not printed and given to SN to sign. note faxed to pt work at [phone] attn: patrice. Carron Curie CMA  May 15, 2009 2:27 PM

## 2010-03-13 NOTE — Progress Notes (Signed)
Summary: sinus/ cough  Phone Note Call from Patient Call back at [phone]   Caller: Patient Call For: nadel Summary of Call: cough and sinus infection [facility]  [address] Initial call taken by: Rickard Patience,  August 24, 2009 8:47 AM  Follow-up for Phone Call        called and spoke with pt.  pt just saw SN last week 08-18-2009.  Pt believes she has a sinus infection.  Pt c/o coughing up green sputum, PND, sweats and chills, facial pressure, nasal congestion with green nasal drainage.  symptoms started 3 days ago.  pt had a zpak at home and has been taking for 3 days with no relief of symptoms.  Pt states she stayed home from work yesterday d/t symptoms and would like work note for this.  SN on vacation . Will forward to TP to address.  Arman Filter LPN  August 24, 2009 8:54 AM   Additional Follow-up for Phone Call Additional follow up Details #1::        Finish zpack  can have work note.  saline nasal rinses  mucinex two times a day for congestion  Please contact office for sooner follow up if symptoms do not improve or worsen  Additional Follow-up by: Rubye Oaks NP,  August 24, 2009 9:17 AM    Additional Follow-up for Phone Call Additional follow up Details #2::    called and spoke with pt and she is aware of TP recs.  she will follow these and the work note has been faxed to her work per pts request.  she stated that she needed the note for tuesday and that she was going back to work today. Randell Loop CMA  August 24, 2009 9:32 AM

## 2010-03-13 NOTE — Letter (Signed)
Summary: [facility]  [address]   [place], [place] [address]   Phone: [phone]  Fax: [phone]    12/01/2009  Katianna Purrington [address] [place], [place]  [address]  To Whom It May Concern,  Ms.Principato has informed me that her sister is ill.  She is requesting time off of work due to her sister's illness and requested that I write this note.  Thank you for your consideration in this matter.  Sincerely,   Lonzo Cloud. Kriste Basque, MD

## 2010-03-15 NOTE — Progress Notes (Signed)
Summary: refill  Phone Note Call from Patient   Caller: Patient Call For: nadel Reason for Call: Refill Medication Summary of Call: Patient requesting refill--alprazolam 0.5 mg--[facility] [phone] Initial call taken by: Lehman Prom,  February 22, 2010 8:30 AM  Follow-up for Phone Call        ATC pt at [phone] and this number belongs to someone else now.  Tried work # and pt no longer works there. Tried to call at [phone] and was unable to leave message.  Will try again again later. Abigail Miyamoto RN  February 22, 2010 8:59 AM   Additional Follow-up for Phone Call Additional follow up Details #1::        Spoke with pt's mother and explained that refill for Alprazolam was denied until pt comes in for ov with Dr Kriste Basque.  She said she would notify pt. Abigail Miyamoto RN  February 22, 2010 9:31 AM

## 2010-03-15 NOTE — Progress Notes (Signed)
Summary: alprazolam  Phone Note Call from Patient   Caller: Patient Call For: nadel Summary of Call: pt requests to have her alprazolam refilled. she has scheduled an rov for 04/11/10 w/ dr Kriste Basque. [facility] [address]  Initial call taken by: Tivis Ringer, CNA,  February 22, 2010 9:44 AM  Follow-up for Phone Call        Pt's rx for Alprazolam was denied due to several no show appts and cancellations. Pt now has appt with Dr Kriste Basque on 04-11-10 and wants med called in to last until then.  Please advise if ok. Abigail Miyamoto RN  February 22, 2010 10:23 AM   Additional Follow-up for Phone Call Additional follow up Details #1::        she can have enough of the alprazolam to last until that appt only---no other refills will be given to pt until she comes in for ov--per SN.  thanks Randell Loop CMA  February 22, 2010 10:24 AM   Pt's sister informed that refill would be sent to pharmacy but pt would not be given any more refils until ov with Dr Kriste Basque. Abigail Miyamoto RN  February 22, 2010 11:09 AM     Prescriptions: XANAX 0.5 MG  TABS (ALPRAZOLAM) take one tablet two times a day as needed for nerves...  #60 x 1   Entered by:   Abigail Miyamoto RN   Authorized by:   Michele Mcalpine MD   Signed by:   Abigail Miyamoto RN on 02/22/2010   Method used:   Telephoned to ...       [facility]  [id number]* (retail)       [address]       [place], [place]  [address]       Ph: [phone] or [phone]       Fax: [phone]   RxID:   [id number]

## 2010-03-15 NOTE — Progress Notes (Signed)
Summary: nos appt  Phone Note Call from Patient   Caller: [email or web address]  Call For: Addysin Porco Summary of Call: LMTCB x2 to rsc nos from 1/4. Initial call taken by: [name],  February 15, 2010 3:47 PM

## 2010-04-11 ENCOUNTER — Ambulatory Visit (INDEPENDENT_AMBULATORY_CARE_PROVIDER_SITE_OTHER): Payer: Self-pay | Admitting: Pulmonary Disease

## 2010-04-11 ENCOUNTER — Other Ambulatory Visit: Payer: Self-pay | Admitting: Pulmonary Disease

## 2010-04-11 ENCOUNTER — Other Ambulatory Visit: Payer: Self-pay

## 2010-04-11 ENCOUNTER — Encounter: Payer: Self-pay | Admitting: Pulmonary Disease

## 2010-04-11 DIAGNOSIS — D649 Anemia, unspecified: Secondary | ICD-10-CM

## 2010-04-11 DIAGNOSIS — E559 Vitamin D deficiency, unspecified: Secondary | ICD-10-CM

## 2010-04-11 DIAGNOSIS — E7800 Pure hypercholesterolemia, unspecified: Secondary | ICD-10-CM

## 2010-04-11 DIAGNOSIS — K219 Gastro-esophageal reflux disease without esophagitis: Secondary | ICD-10-CM

## 2010-04-11 DIAGNOSIS — E785 Hyperlipidemia, unspecified: Secondary | ICD-10-CM

## 2010-04-11 DIAGNOSIS — F411 Generalized anxiety disorder: Secondary | ICD-10-CM

## 2010-04-11 DIAGNOSIS — J309 Allergic rhinitis, unspecified: Secondary | ICD-10-CM

## 2010-04-11 DIAGNOSIS — R51 Headache: Secondary | ICD-10-CM

## 2010-04-11 LAB — BASIC METABOLIC PANEL
GFR: 143.49 mL/min (ref >60.00)
Potassium: 4.5 mEq/L (ref 3.5–5.1)
Sodium: 139 mEq/L (ref 135–145)

## 2010-04-11 LAB — HEPATIC FUNCTION PANEL
AST: 14 U/L (ref 0–37)
Alkaline Phosphatase: 41 U/L (ref 39–117)
Bilirubin, Direct: 0 mg/dL (ref 0.0–0.3)
Total Bilirubin: 0.7 mg/dL (ref 0.3–1.2)

## 2010-04-11 LAB — CBC WITH DIFFERENTIAL/PLATELET
Basophils Absolute: 0 10*3/uL (ref 0.0–0.1)
HCT: 36.4 % (ref 36.0–46.0)
Hemoglobin: 12.3 g/dL (ref 12.0–15.0)
Lymphs Abs: 2.3 10*3/uL (ref 0.7–4.0)
MCV: 83.1 fl (ref 78.0–100.0)
Monocytes Absolute: 0.4 10*3/uL (ref 0.1–1.0)
Neutro Abs: 3.8 10*3/uL (ref 1.4–7.7)
Platelets: 380 10*3/uL (ref 150.0–400.0)
RDW: 13.3 % (ref 11.5–14.6)

## 2010-04-11 LAB — LIPID PANEL
Cholesterol: 204 mg/dL — ABNORMAL HIGH (ref 0–200)
Total CHOL/HDL Ratio: 4

## 2010-04-11 LAB — LDL CHOLESTEROL, DIRECT: Direct LDL: 139.7 mg/dL

## 2010-04-15 LAB — CONVERTED CEMR LAB: Vit D, 25-Hydroxy: 26 ng/mL — ABNORMAL LOW (ref 30–89)

## 2010-04-24 NOTE — Assessment & Plan Note (Signed)
Summary: rov/kp   CC:  8 month ROV & review of mult medical problems....  History of Present Illness: 45 y/o BF here for a follow up visit... she has multiple medical problems as noted below... Followed for general medical purposes w/ hx of Hyperchol, GERD, HA's, anxiety, and mild anemia...    ~  August 18, 2009:  yearly ROV, doing well, no new complaints or concerns, needs meds refilled> states she's been taking the Prav40 & Vit D 50K/wk... not fasting today & she will need to ret for fasting labs (never did)...  c/o anxiety due to "Momma drinking heavily"...   ~  April 11, 2010:  8 mo ROV & under alot of stress w/ "alot goin on" > lost job, moved out, Momma had CABG, etc... on Prav40 for Chol & labs today LDL 140 "I had meatloaf for breakfast"> I called [facility] on [address] & last filled #30 on 02/22/10 therefore NOT taking regularly... she notes GERD controlled on Prilosec "when I can get it" and using Tylenol for HAs... she is requesting Rx for her Xanax & Zoloft.    Current Problems:   ALLERGIC RHINITIS (ICD-477.9) - on Zyrtek, Pepcid, Benedryl OTC as needed, and HYDROXYZINE 25mg  for itching...  HYPERCHOLESTEROLEMIA (ICD-272.0) - on PRAVASTATIN 40mg /d, +diet Rx...  ~  FLP 6/06 showed TChol 191, TG 64, HDL 48, LDL 131... she prefers diet Rx.  ~  FLP 7/10 showed TChol 205, TG 55, HDL 53, LDL 143... she agrees to try PRAV40.  ~  FLP 2/12 on Prav40 but not regularly & not fasting> TChol 204, TG 107, HDL 56, LDL 140... we reviewed diet, discussed medication compliance, & explained FASTING labs.  GERD (ICD-530.81) - on PRILOSEC OTC 20mg /d> "it's doin good"...  ~  EGD 2/02 by DrPatterson showed HH, GERD, no ulcer etc... Rx w/ PPI.  ~  Colonoscopy 2/02 by DrPatterson was normal- no signs of IBD...  ~  7/10: switched to PRILOSEC OTC 20mg /d for $$ reasons.  HEADACHE, MIXED (ICD-784.0) - prev on MIDRIN & now using ESTylenol Prn...  ANXIETY DISORDER, GENERALIZED (ICD-300.02) - she takes ZOLOFT  50mg /d,  & XANAX 0.5mg Bid...   ~  7/10: she states these meds working well and wants to continue the same...  ~  7/11:  requesting refills Zoloft (mood improved), and Xanax ("there's alot goin on, Momma is drinking heavily").  ~  2/12: pt requests refill of both meds...  VITAMIN D DEFICIENCY (ICD-268.9) - on Vit D 50K weekly...  ~  labs 7/10 showed Vit d level = 10... rec> start 50K weekly x 34mo & repeat level.  ~  labs 2/12 showed Vit D level = 26... I called [facility] on [address]> last filled 12/3/11for #4... we discussed med refills and medication compliance.  SKIN RASH (ICD-782.1) - ??? etiology- refer for allergy testing...  ANEMIA, MILD (ICD-285.9)  ~  labs 9/06 showed Hg= 12.8, MCV= 83  ~  labs 7/10 showed Hg= 11.9, MCV= 84... we discussed MVI w/ Fe.  ~  labs 2/12 showed Hg= 12.3  Health Maintenance - she states that she will call GYN= DrHarper for PAP, Mammogram, etc...   Preventive Screening-Counseling & Management  Alcohol-Tobacco     Smoking Status: never  Allergies: 1)  Codeine Phosphate (Codeine Phosphate)  Comments:  Nurse/Medical Assistant: The patient's medications and allergies were reviewed with the patient and were updated in the Medication and Allergy Lists.  Past History:  Past Medical History: ALLERGIC RHINITIS (ICD-477.9) HYPERCHOLESTEROLEMIA (ICD-272.0) GERD (ICD-530.81) HEADACHE, MIXED (  ICD-784.0) ANXIETY DISORDER, GENERALIZED (ICD-300.02) VITAMIN D DEFICIENCY (ICD-268.9) SKIN RASH (ICD-782.1) ANEMIA, MILD (ICD-285.9)  Past Surgical History: S/P left index finger tendon repair 1990  Family History: Reviewed history from 08/18/2008 and no changes required. 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    Vital Signs:  Patient profile:   45 year old female Height:      60 inches Weight:      144.13 pounds BMI:     28.25 O2 Sat:      100 % on Room air Temp:     97.6 degrees F oral Pulse rate:   84 / minute BP sitting:   150 / 90  (right arm) Cuff size:   regular  Vitals Entered By: Randell Loop CMA (April 11, 2010 11:08 AM)  O2 Sat at Rest %:  100 O2 Flow:  Room air CC: 8 month ROV & review of mult medical problems... Is Patient Diabetic? No Pain Assessment Patient in pain? no      Comments no changes in meds per pt   Physical Exam  Additional Exam:  WD, WN, 44 y/o BF in NAD... GENERAL:  Alert & oriented; pleasant & cooperative... HEENT:  [place]/AT, EOM-wnl, PERRLA, EACs-clear, TMs-wnl, NOSE-clear, THROAT-clear & wnl. NECK:  Supple w/ fairROM; no JVD; normal carotid impulses w/o bruits; no thyromegaly or nodules palpated; no lymphadenopathy. CHEST:  Clear to P & A; without wheezes/ rales/ or rhonchi heard... HEART:  Regular Rhythm; without murmurs/ rubs/ or gallops detected... ABDOMEN:  Soft & nontender; normal bowel sounds; no organomegaly or masses palpated... EXT: without deformities, mild arthritic changes; no varicose veins/ venous insuffic/ or edema. NEURO:  CN's intact; motor testing normal; sensory testing normal; gait normal & balance OK. DERM:  No lesions noted; no rash etc...    Impression & Recommendations:  Problem #  1:  HYPERCHOLESTEROLEMIA (ICD-272.0) Reminded to take med every day... Her updated medication list for this problem includes:    Pravastatin Sodium 40 Mg Tabs (Pravastatin sodium) .Marland Kitchen... Take 1 tab by mouth at bedtime for cholesterol...  Orders: T-Vitamin D (25-Hydroxy) [phone]) TLB-BMP (Basic Metabolic Panel-BMET) (80048-METABOL) TLB-Hepatic/Liver Function Pnl (80076-HEPATIC) TLB-CBC Platelet - w/Differential (85025-CBCD) TLB-Lipid Panel (80061-LIPID) TLB-TSH (Thyroid Stimulating Hormone) (84443-TSH)  Problem # 2:  GERD (ICD-530.81) She's stable on the PPI "when I can get it"... Her updated medication list for this problem includes:    Prilosec Otc 20 Mg Tbec (Omeprazole magnesium) .Marland Kitchen... Take 1 tab by mouth once daily...  Problem # 3:  HEADACHE, MIXED (ICD-784.0) Doing well on Tylenol alone...  Problem # 4:  ANXIETY DISORDER, GENERALIZED (ICD-300.02) She is requesting to stay on the alpraz & Sertraline for nerves... The following medications were removed from the medication list:    Hydroxyzine Hcl 25 Mg Tabs (Hydroxyzine hcl) .Marland Kitchen... Take 1 tab by mouth every 6 h as needed  for itching... Her updated medication list for this problem includes:    Zoloft 50 Mg Tabs (Sertraline hcl) .Marland Kitchen... Take 1 tablet by mouth once a day    Xanax 0.5 Mg Tabs (Alprazolam) .Marland Kitchen... Take one tablet two times a day as needed for nerves...  Problem # 5:  ANEMIA, MILD (ICD-285.9) Hg is stable> same rx...  Complete Medication List: 1)  Pravastatin Sodium 40 Mg Tabs (Pravastatin sodium) .... Take 1 tab by mouth at bedtime for cholesterol... 2)  Prilosec Otc 20 Mg Tbec (Omeprazole magnesium) .... Take 1 tab by mouth once daily.Marland KitchenMarland Kitchen 3)  Zoloft 50 Mg Tabs (Sertraline hcl) .... Take 1 tablet by mouth once a day 4)  Xanax 0.5 Mg Tabs (Alprazolam) .... Take one tablet two times a day as needed for nerves.Marland KitchenMarland Kitchen 5)  Womens Multivitamin Plus Tabs (Multiple vitamins-minerals) .... Take 1 tab by mouth once daily.Marland KitchenMarland Kitchen 6)   Vitamin D (ergocalciferol) 50000 Unit Caps (Ergocalciferol) .... Take 1 cap by mouth each week...  Patient Instructions: 1)  Today we updated your med list- see below.... 2)  We refilled your Zoloft & Xanax today... 3)  We also did your follow up Blood work... please call the "phone tree" in a few days for your lab results.Marland KitchenMarland Kitchen 4)  Call for any questions.Marland KitchenMarland Kitchen 5)  Please schedule a follow-up appointment in 6 months. Prescriptions: XANAX 0.5 MG  TABS (ALPRAZOLAM) take one tablet two times a day as needed for nerves...  #60 x 6   Entered and Authorized by:   Michele Mcalpine MD   Signed by:   Michele Mcalpine MD on 04/11/2010   Method used:   Print then Give to Patient   RxID:   [id number] ZOLOFT 50 MG  TABS (SERTRALINE HCL) Take 1 tablet by mouth once a day  #30 x 6   Entered and Authorized by:   Michele Mcalpine MD   Signed by:   Michele Mcalpine MD on 04/11/2010   Method used:   Print then Give to Patient   RxID:   [id number]

## 2010-04-25 ENCOUNTER — Telehealth: Payer: Self-pay | Admitting: Pulmonary Disease

## 2010-05-01 NOTE — Progress Notes (Signed)
Summary: Alprazolam and zoloft RX lost, need refills--  Phone Note Call from Patient Call back at Home Phone [phone]   Caller: Patient Call For: Brenda [name] Reason for Call: Refill Medication, Talk to Nurse Summary of Call: Patient calling stating that she lost rx for alprazolam and zoloft from last visit.  Please call to [facility] [address] Initial call taken by: Lehman Prom,  April 25, 2010 11:48 AM  Follow-up for Phone Call        [facility].Michel Bickers CMA  April 25, 2010 12:16 PM  Called and left message with mother for pt to call us back Carver Fila  April 26, 2010 5:26 PM   [id number] x3 Carver Fila  April 27, 2010 9:14 AM   Additional Follow-up for Phone Call Additional follow up Details #1::        returning call.  call back [phone] Lehman Prom  April 27, 2010 9:46 AM  Pt states she lost rx for xanax and zoloft from last visit and is requesting we call these medications in to pharmacy.  I checked the pharmacy and they do not have a current rx on file for either medication. Please advise if ok to send rx. Carron Curie CMA  April 27, 2010 10:09 AM     Additional Follow-up for Phone Call Additional follow up Details #2::    per SN---ok to send in the rx for pt since she lost these rx-----called and spoke with [facility] [address] and they have never filled any rx for pt or at any other [facility] pharmacy----rx have been sent to the pts pharmacy and pt is aware Randell Loop CMA  April 27, 2010 5:09 PM   Prescriptions: XANAX 0.5 MG  TABS (ALPRAZOLAM) take one tablet two times a day as needed for nerves...  #60 x 6   Entered by:   Randell Loop CMA   Authorized by:   Michele Mcalpine MD   Signed by:   Randell Loop CMA on 04/27/2010   Method used:   Telephoned to ...       [facility]  [address] [id number]* (retail)       [address]       [place], [place]  [address]       Ph: [phone] or [phone]       Fax: [phone]   RxID:    [id number] ZOLOFT 50 MG  TABS (SERTRALINE HCL) Take 1 tablet by mouth once a day  #30 x 6   Entered by:   Randell Loop CMA   Authorized by:   Michele Mcalpine MD   Signed by:   Randell Loop CMA on 04/27/2010   Method used:   Electronically to        [facility] [id number]* (retail)       [address]       [place], [place]  [address]       Ph: [phone] or [phone]       Fax: [phone]   RxID:   [id number]

## 2010-06-26 NOTE — Assessment & Plan Note (Signed)
[facility]                             PULMONARY OFFICE NOTE   ALIZ, MERITT                       MRN:          [id number]  DATE:11/04/2006                            DOB:          26-Mar-1965    HISTORY OF PRESENT ILLNESS:  The patient is a 45 year old  African/American female, a patient of Dr. Lonzo Cloud. Nadel's, who has a  known history of allergic rhinitis, gastroesophageal reflux disease and  anxiety.  The patient reports since her last visit one year ago, that  she has been doing well.  She is currently taking Zoloft and Xanax for  stress and anxiety.  She seems to be well-controlled.  The patient  denies any suicidal ideations.  The patient also has gastroesophageal  reflux and is maintained on Prevacid 30 mg daily.  The patient reports  that she has had no break-through reflux symptoms.  She denies any chest  pain, shortness of breath, abdominal pain, bloody stools or leg  swelling.   PAST MEDICAL HISTORY:  Is reviewed.   PHYSICAL EXAMINATION:  GENERAL:  The patient is a pleasant female and in  no acute distress.  VITAL SIGNS:  She is afebrile with stable vital signs.  O2 saturation  100% on room air.  HEENT:  Eyes unremarkable.  NECK:  Supple without cervical adenopathy.  No jugular venous  distention.  LUNGS:  Sounds are clear.  HEART:  A regular rate and rhythm.  ABDOMEN:  Soft, nontender.  EXTREMITIES:  Warm without any edema.   IMPRESSION/PLAN:  1. Gastroesophageal reflux, well-controlled on Prevacid:  Continue on      reflux preventative measures and weight loss.  2. Anxiety:  The patient is to continue on Zoloft and Xanax.  The      patient is recommended stress-reducing exercises.  The patient will      return back here in 1 months for a complete physical examination,      or sooner if needed.  3. Health maintenance:  The patient is recommended diet, exercise and      weight loss.  To do breast self-examination.  A  Tetanus booster was      given today in the office.  The patient is      recommended to make her office visit with her gynecologist for her      Pap smear and mammogram.      Rubye Oaks, NP  Electronically Signed      Lonzo Cloud. Kriste Basque, MD  Electronically Signed   TP/MedQ  DD: 11/04/2006  DT: 11/04/2006  Job #: [id number]

## 2010-06-29 NOTE — Letter (Signed)
January 03, 2006     RE:  YAEKO, FAZEKAS  MRN:  [id number]  /  DOB:  1965/09/04   Dear Ms. Defino:   You again failed to show for your gastroenterology appointment dated  January 03, 2006, as you have done multiple times in the past.  We actually  have called you in the past about your multiple no-shows and consequences  thereof.  I therefore am withdrawing from your gastroenterology care.  I  will be glad to forward you a list of competent gastroenterologists in this  area for your convenience if so desired.    Sincerely,      Vania Rea. Jarold Motto, MD, Caleen Essex, FAGA  Electronically Signed    DRP/MedQ  DD: 01/03/2006  DT: 01/03/2006  Job #: [id number]   CC:    Lonzo Cloud. Kriste Basque, MD

## 2010-08-07 ENCOUNTER — Emergency Department (HOSPITAL_COMMUNITY)
Admission: EM | Admit: 2010-08-07 | Discharge: 2010-08-07 | Disposition: A | Discharge status: Home / Self Care | Payer: PRIVATE HEALTH INSURANCE | Attending: Emergency Medicine | Admitting: Emergency Medicine

## 2010-08-07 ENCOUNTER — Emergency Department (HOSPITAL_COMMUNITY): Payer: PRIVATE HEALTH INSURANCE

## 2010-08-07 DIAGNOSIS — K219 Gastro-esophageal reflux disease without esophagitis: Secondary | ICD-10-CM | POA: Insufficient documentation

## 2010-08-07 DIAGNOSIS — R52 Pain, unspecified: Secondary | ICD-10-CM | POA: Insufficient documentation

## 2010-08-07 DIAGNOSIS — R079 Chest pain, unspecified: Secondary | ICD-10-CM | POA: Insufficient documentation

## 2010-08-07 DIAGNOSIS — F341 Dysthymic disorder: Secondary | ICD-10-CM | POA: Insufficient documentation

## 2010-08-07 DIAGNOSIS — Z79899 Other long term (current) drug therapy: Secondary | ICD-10-CM | POA: Insufficient documentation

## 2010-08-07 DIAGNOSIS — R002 Palpitations: Secondary | ICD-10-CM | POA: Insufficient documentation

## 2010-08-07 DIAGNOSIS — E7800 Pure hypercholesterolemia, unspecified: Secondary | ICD-10-CM | POA: Insufficient documentation

## 2010-08-07 LAB — URINALYSIS, ROUTINE W REFLEX MICROSCOPIC
Bilirubin Urine: NEGATIVE
Glucose, UA: NEGATIVE mg/dL
Ketones, ur: NEGATIVE mg/dL
Leukocytes, UA: NEGATIVE
Specific Gravity, Urine: 1.019 (ref 1.005–1.030)
pH: 7 (ref 5.0–8.0)

## 2010-08-07 LAB — TROPONIN I: Troponin I: <0.3 ng/mL (ref <0.30)

## 2010-08-07 LAB — URINE MICROSCOPIC-ADD ON

## 2010-08-07 LAB — CBC
Platelets: 412 10*3/uL — ABNORMAL HIGH (ref 150–400)
RBC: 4.51 MIL/uL (ref 3.87–5.11)
RDW: 13 % (ref 11.5–15.5)
WBC: 6.5 10*3/uL (ref 4.0–10.5)

## 2010-08-07 LAB — CK TOTAL AND CKMB (NOT AT ARMC): Total CK: 95 U/L (ref 7–177)

## 2010-08-07 LAB — BASIC METABOLIC PANEL
Chloride: 103 mEq/L (ref 96–112)
Creatinine, Ser: 0.49 mg/dL — ABNORMAL LOW (ref 0.50–1.10)
GFR calc Af Amer: >60 mL/min (ref >60)
GFR calc non Af Amer: >60 mL/min (ref >60)
Potassium: 3.7 mEq/L (ref 3.5–5.1)

## 2010-08-21 ENCOUNTER — Other Ambulatory Visit: Payer: Self-pay | Admitting: Pulmonary Disease

## 2010-10-10 ENCOUNTER — Ambulatory Visit: Payer: Self-pay | Admitting: Pulmonary Disease

## 2010-10-24 ENCOUNTER — Ambulatory Visit: Payer: Self-pay | Admitting: Pulmonary Disease

## 2010-11-01 ENCOUNTER — Other Ambulatory Visit: Payer: Self-pay | Admitting: Pulmonary Disease

## 2010-11-30 ENCOUNTER — Other Ambulatory Visit: Payer: Self-pay | Admitting: Pulmonary Disease

## 2010-12-03 ENCOUNTER — Telehealth: Payer: Self-pay | Admitting: Pulmonary Disease

## 2010-12-03 MED ORDER — ALPRAZOLAM 0.5 MG PO TABS: 0.5000 mg | ORAL_TABLET | Freq: Two times a day (BID) | ORAL | Status: DC | PRN

## 2010-12-03 NOTE — Telephone Encounter (Signed)
Addended by: Christen Butter on: 12/03/2010 04:58 PM   Modules accepted: Orders

## 2010-12-03 NOTE — Telephone Encounter (Signed)
Rx was refilled LMTCB 

## 2010-12-03 NOTE — Telephone Encounter (Signed)
Patient calling back.   °

## 2010-12-03 NOTE — Telephone Encounter (Signed)
Pt requesting refill on Xanax.  Last saw SN on 04/11/10 and has pending appt scheduled with SN for 12/13/10.  Pt last given rx for Xanax on 04/25/10 for # 60 x 6 refills.  Sn, please advise if ok to refill or not?  Thanks.

## 2010-12-03 NOTE — Telephone Encounter (Signed)
Ok to give 1 refill at this time.  We will give pt refills at her next ov with SN.  thanks

## 2010-12-13 ENCOUNTER — Ambulatory Visit: Payer: Self-pay | Admitting: Pulmonary Disease

## 2010-12-31 ENCOUNTER — Other Ambulatory Visit: Payer: Self-pay | Admitting: Pulmonary Disease

## 2011-01-01 ENCOUNTER — Telehealth: Payer: Self-pay | Admitting: Pulmonary Disease

## 2011-01-01 NOTE — Telephone Encounter (Signed)
I spoke with pt and advised her rx's was called in yesterday. Pt states [facility] they did not [facility] them. [facility] and was advised they did received zoloft but the alprazolam was not. I gave verbal order for the alprazolam. Pt aware and nothing further was needed

## 2011-01-10 ENCOUNTER — Ambulatory Visit: Payer: Self-pay | Admitting: Pulmonary Disease

## 2011-01-23 ENCOUNTER — Telehealth: Payer: Self-pay | Admitting: Pulmonary Disease

## 2011-01-23 NOTE — Telephone Encounter (Signed)
Called and spoke with [facility] [address]---they last filled these meds for her on 11-20---pharmacy is aware that these meds cannot be filled until 12-20 for the pt.   Called and spoke with the pt and she is aware that we are not able to call in early refills for her. She will have to wait until the 20th of this month to get these meds refilled.  Pt voiced her understanding of this.

## 2011-01-23 NOTE — Telephone Encounter (Signed)
Called spoke with patient who reported that she threw a Christmas party last night and when she woke up today at noon, she noticed that her xanax and zoloft were missing from her dresser.  Pt is requesting an early refill on these medications.  Called pt's pharmacy: last fill was xanax 11.28.12 for #60 (takes BID prn) and zoloft 11.28.12 #30.    Dr Kriste Basque please advise if okay for pt to fill early, thanks.

## 2011-01-31 ENCOUNTER — Telehealth: Payer: Self-pay | Admitting: Pulmonary Disease

## 2011-01-31 MED ORDER — SERTRALINE HCL 50 MG PO TABS: 50.0000 mg | ORAL_TABLET | Freq: Every day | ORAL | Status: DC

## 2011-01-31 NOTE — Telephone Encounter (Signed)
Attempted to call the pt with the number given---number is not in working order--refills of the alprazolam were given to the pharmacy on 12-12 with instructions to not fill this med until 12-20 and refills of the zoloft have been sent to the pharmacy.

## 2011-02-15 ENCOUNTER — Encounter: Payer: Self-pay | Admitting: Pulmonary Disease

## 2011-02-15 ENCOUNTER — Ambulatory Visit (INDEPENDENT_AMBULATORY_CARE_PROVIDER_SITE_OTHER): Payer: PRIVATE HEALTH INSURANCE | Admitting: Pulmonary Disease

## 2011-02-15 ENCOUNTER — Other Ambulatory Visit (INDEPENDENT_AMBULATORY_CARE_PROVIDER_SITE_OTHER): Payer: PRIVATE HEALTH INSURANCE

## 2011-02-15 ENCOUNTER — Other Ambulatory Visit: Payer: Self-pay | Admitting: Pulmonary Disease

## 2011-02-15 DIAGNOSIS — E559 Vitamin D deficiency, unspecified: Secondary | ICD-10-CM

## 2011-02-15 DIAGNOSIS — E7800 Pure hypercholesterolemia, unspecified: Secondary | ICD-10-CM

## 2011-02-15 DIAGNOSIS — J309 Allergic rhinitis, unspecified: Secondary | ICD-10-CM

## 2011-02-15 DIAGNOSIS — F411 Generalized anxiety disorder: Secondary | ICD-10-CM

## 2011-02-15 DIAGNOSIS — Z23 Encounter for immunization: Secondary | ICD-10-CM

## 2011-02-15 DIAGNOSIS — R51 Headache: Secondary | ICD-10-CM

## 2011-02-15 DIAGNOSIS — K219 Gastro-esophageal reflux disease without esophagitis: Secondary | ICD-10-CM

## 2011-02-15 DIAGNOSIS — D649 Anemia, unspecified: Secondary | ICD-10-CM

## 2011-02-15 DIAGNOSIS — G4733 Obstructive sleep apnea (adult) (pediatric): Secondary | ICD-10-CM

## 2011-02-15 LAB — CBC WITH DIFFERENTIAL/PLATELET
Basophils Absolute: 0.1 10*3/uL (ref 0.0–0.1)
Eosinophils Absolute: 0.7 10*3/uL (ref 0.0–0.7)
Eosinophils Relative: 8.6 % — ABNORMAL HIGH (ref 0.0–5.0)
HCT: 36.3 % (ref 36.0–46.0)
Lymphs Abs: 2.3 10*3/uL (ref 0.7–4.0)
MCHC: 33.5 g/dL (ref 30.0–36.0)
MCV: 81.9 fl (ref 78.0–100.0)
Monocytes Absolute: 0.5 10*3/uL (ref 0.1–1.0)
Neutrophils Relative %: 55 % (ref 43.0–77.0)
Platelets: 366 10*3/uL (ref 150.0–400.0)
RDW: 13.6 % (ref 11.5–14.6)
WBC: 8 10*3/uL (ref 4.5–10.5)

## 2011-02-15 LAB — BASIC METABOLIC PANEL
BUN: 10 mg/dL (ref 6–23)
Chloride: 103 mEq/L (ref 96–112)
GFR: 173.68 mL/min (ref >60.00)
Potassium: 4.4 mEq/L (ref 3.5–5.1)
Sodium: 138 mEq/L (ref 135–145)

## 2011-02-15 LAB — HEPATIC FUNCTION PANEL
ALT: 18 U/L (ref 0–35)
AST: 19 U/L (ref 0–37)
Alkaline Phosphatase: 46 U/L (ref 39–117)
Bilirubin, Direct: 0.1 mg/dL (ref 0.0–0.3)
Total Bilirubin: 0.6 mg/dL (ref 0.3–1.2)

## 2011-02-15 LAB — LIPID PANEL
HDL: 64.9 mg/dL (ref >39.00)
VLDL: 10.4 mg/dL (ref 0.0–40.0)

## 2011-02-15 LAB — TSH: TSH: 0.69 u[IU]/mL (ref 0.35–5.50)

## 2011-02-15 MED ORDER — PRAVASTATIN SODIUM 40 MG PO TABS: 40.0000 mg | ORAL_TABLET | Freq: Every day | ORAL | Status: DC

## 2011-02-15 MED ORDER — ALPRAZOLAM 0.5 MG PO TABS: 0.5000 mg | ORAL_TABLET | Freq: Three times a day (TID) | ORAL | Status: DC | PRN

## 2011-02-15 MED ORDER — TRAMADOL HCL 50 MG PO TABS: 50.0000 mg | ORAL_TABLET | Freq: Three times a day (TID) | ORAL | Status: AC | PRN

## 2011-02-15 MED ORDER — OMEPRAZOLE MAGNESIUM 20 MG PO TBEC: 20.0000 mg | DELAYED_RELEASE_TABLET | Freq: Every day | ORAL | Status: DC

## 2011-02-15 MED ORDER — SERTRALINE HCL 50 MG PO TABS: 50.0000 mg | ORAL_TABLET | Freq: Every day | ORAL | Status: DC
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
## 2011-02-15 NOTE — Patient Instructions (Signed)
Today we updated your med list in our EPIC system...    Continue your current medications the same...    We refilled your meds for 2013 & wrote a new prescription for Tramadol to take every 6-8H as needed for pain...  Today we did your follow up fasting blood work...    Please call the [facility]REE in a few days for your results...    Dial [id number] & when prompted enter your patient number followed by the # symbol...    Your patient number is:  [id number]#  We will arrange for a Sleep Study to evaluate you for sleep apnea...    We will call you w/ the result when available...  We gave you the 2012 Flu vaccine today...  Call for any questions...  Let's plan a brief f/u visit in 6 months, required to keep refilling your Xanax & Zoloft.Marland KitchenMarland Kitchen

## 2011-02-16 ENCOUNTER — Encounter: Payer: Self-pay | Admitting: Pulmonary Disease

## 2011-02-16 LAB — VITAMIN D 25 HYDROXY (VIT D DEFICIENCY, FRACTURES): Vit D, 25-Hydroxy: 14 ng/mL — ABNORMAL LOW (ref 30–89)

## 2011-02-18 ENCOUNTER — Telehealth: Payer: Self-pay | Admitting: Pulmonary Disease

## 2011-02-18 ENCOUNTER — Other Ambulatory Visit: Payer: Self-pay | Admitting: Pulmonary Disease

## 2011-02-18 NOTE — Telephone Encounter (Signed)
Spoke with pt and advised that we gave her this rx at her appt on 02/15/11. She states that she has it, but thought that we were going to call this in. I advised that since she has the rx, will need to take it to the pharmacy and have it filled her self. Pt verbalized understanding. States nothing further needed.

## 2011-02-21 ENCOUNTER — Telehealth: Payer: Self-pay | Admitting: Pulmonary Disease

## 2011-02-21 NOTE — Telephone Encounter (Signed)
LMOM TCB x1.  No record of recent zpak or tamiflu in pt chart.  ???

## 2011-02-21 NOTE — Telephone Encounter (Signed)
Pt returned triage's call.  Holly D Pryor ° °

## 2011-02-25 NOTE — Telephone Encounter (Signed)
lmomtcb  

## 2011-02-26 ENCOUNTER — Encounter (HOSPITAL_BASED_OUTPATIENT_CLINIC_OR_DEPARTMENT_OTHER): Payer: PRIVATE HEALTH INSURANCE

## 2011-02-26 NOTE — Telephone Encounter (Signed)
ATC and msg states [facility] caller unavailable. WCB later.

## 2011-02-26 NOTE — Telephone Encounter (Signed)
Home number invalid. Mobile number unavailable and says the [employer] customer is out of the service area. Will try again later.

## 2011-02-27 NOTE — Telephone Encounter (Signed)
I have tried the mobile number and msg says this number is unavailable and may be out of the service area. Home number msg says this is an invalid number. Called work number and was told the pt no longer worked there. Will close phone note and pt will need to callback to leave a new msg if still needing anything from our office.

## 2011-03-07 ENCOUNTER — Encounter: Payer: Self-pay | Admitting: *Deleted

## 2011-03-11 ENCOUNTER — Ambulatory Visit (HOSPITAL_BASED_OUTPATIENT_CLINIC_OR_DEPARTMENT_OTHER): Payer: PRIVATE HEALTH INSURANCE

## 2011-03-11 ENCOUNTER — Telehealth: Payer: Self-pay | Admitting: Pulmonary Disease

## 2011-03-11 MED ORDER — ERGOCALCIFEROL 1.25 MG (50000 UT) PO CAPS: 50000.0000 [IU] | ORAL_CAPSULE | ORAL | Status: DC

## 2011-03-11 NOTE — Telephone Encounter (Signed)
Refill has been sent to the pts pharmacy and pt is aware. 

## 2011-04-15 ENCOUNTER — Telehealth: Payer: Self-pay | Admitting: Pulmonary Disease

## 2011-04-15 MED ORDER — AZITHROMYCIN 250 MG PO TABS: ORAL_TABLET | ORAL | Status: AC

## 2011-04-15 NOTE — Telephone Encounter (Signed)
lmomtcb x1 

## 2011-04-15 NOTE — Telephone Encounter (Signed)
Pt returned call.  C/o head congestion, prod cough with green mucus, hoarseness, PND, green nasal drainage, f/c/s x1week - denies SOB.  [facility] [address]  Pt requesting "something stronger than a zpak."  Last seen by 1.4.13, upcoming 7.5.13.  Allergies  Allergen Reactions  . Codeine Phosphate     REACTION: vomiting   Dr Kriste Basque please advise, thanks.

## 2011-04-15 NOTE — Telephone Encounter (Signed)
Per SN:  zpak take as directed #1 no refill, mucinex otc 600mg  2 po bid, and delsym otc 2 tsp bid prn cough.    LMOMTCB - z pak rx sent to pharmacy.

## 2011-04-15 NOTE — Telephone Encounter (Signed)
Pt returned call. Brenda [name]  

## 2011-04-15 NOTE — Telephone Encounter (Signed)
LMOM for pt TCB to get more information on her symptoms

## 2011-04-16 NOTE — Telephone Encounter (Signed)
Called, spoke with pt.  I informed her SN recs she take zpak, mucinex otc 600 mg 2 tabs bid, and delsym 2 tsp bid prn cough and advised zpak rx sent to [facility].  She verbalized understanding of these instructions and will call back if symptoms do not improve or seek emergency care if needed.

## 2011-06-04 ENCOUNTER — Ambulatory Visit (HOSPITAL_BASED_OUTPATIENT_CLINIC_OR_DEPARTMENT_OTHER): Payer: PRIVATE HEALTH INSURANCE | Attending: Pulmonary Disease | Admitting: General Practice

## 2011-06-04 VITALS — Ht 62.0 in | Wt 152.0 lb

## 2011-06-04 DIAGNOSIS — G4733 Obstructive sleep apnea (adult) (pediatric): Secondary | ICD-10-CM | POA: Insufficient documentation

## 2011-06-20 DIAGNOSIS — G4733 Obstructive sleep apnea (adult) (pediatric): Secondary | ICD-10-CM

## 2011-06-21 NOTE — Procedures (Signed)
NAME:  Brenda [name], BEISSEL NO.:  [id number]  MEDICAL RECORD NO.:  [id number]          PATIENT TYPE:  OUT  LOCATION:  SLEEP CENTER                 FACILITY[facility]  PHYSICIAN:  Barbaraann Share, MD,FCCPDATE OF BIRTH:  1965/07/28  DATE OF STUDY:  06/04/2011                           NOCTURNAL POLYSOMNOGRAM  REFERRING PHYSICIAN:  Lonzo Cloud. Kriste Basque, MD  REFERRING PHYSICIAN:  Lonzo Cloud. Kriste Basque, MD.  INDICATION FOR STUDY:  Hypersomnia with sleep apnea.  EPWORTH SLEEPINESS SCORE:  Twelve.  MEDICATIONS:  SLEEP ARCHITECTURE:  The patient had total sleep time of 279 minutes with no slow-wave sleep and only 58 minutes of REM.  Sleep onset latency was prolonged at 62 minutes and REM onset was at the upper limits of normal at 124 minutes.  Sleep efficiency was moderately decreased at 72%.  RESPIRATORY DATA:  The patient was found to have 32 apneas and 10 obstructive hypopneas, giving her an apnea/hypopnea index of 9 events per hour.  The events occurred in all body positions and there was loud snoring noted throughout.  OXYGEN DATA:  There was O2 desaturation as low as 89% with the patient's obstructive events.  CARDIAC DATA:  Rare PAC noted, but no clinically significant arrhythmias were seen.  MOVEMENT-PARASOMNIA:  The patient had no significant leg jerks or other abnormal behaviors noted.  IMPRESSIONS-RECOMMENDATIONS: 1. Mild obstructive sleep apnea/hypopnea syndrome with an     apnea/hypopnea index of 9 events per hour and oxygen desaturation     as low as 89%.  Treatment for this degree of sleep apnea can     include a trial of weight loss alone, upper airway surgery, dental     appliance, and also continuous positive airway pressure.  The     decision to treat this degree of sleep apnea aggressively should     depend upon its impact to the patient's quality of life, since     it does not represent a significant cardiovascular risk for the     patient.  Clinical  correlation is suggested. 2. Rare premature atrial contraction noted, but no clinically     significant arrhythmias were seen.     Barbaraann Share, MD,FCCP Diplomate, American Board of Sleep Medicine    [facility]/MEDQ  D:  06/20/2011 08:25:13  T:  06/20/2011 23:55:10  Job:  [id number]

## 2011-08-16 ENCOUNTER — Ambulatory Visit: Payer: PRIVATE HEALTH INSURANCE | Admitting: Pulmonary Disease

## 2011-08-19 ENCOUNTER — Ambulatory Visit (INDEPENDENT_AMBULATORY_CARE_PROVIDER_SITE_OTHER): Payer: Self-pay | Admitting: Pulmonary Disease

## 2011-08-19 ENCOUNTER — Encounter: Payer: Self-pay | Admitting: Pulmonary Disease

## 2011-08-19 VITALS — BP 130/78 | HR 82 | Temp 97.2°F | Ht 60.0 in | Wt 151.0 lb

## 2011-08-19 DIAGNOSIS — J309 Allergic rhinitis, unspecified: Secondary | ICD-10-CM

## 2011-08-19 DIAGNOSIS — K219 Gastro-esophageal reflux disease without esophagitis: Secondary | ICD-10-CM

## 2011-08-19 DIAGNOSIS — R51 Headache: Secondary | ICD-10-CM

## 2011-08-19 DIAGNOSIS — D649 Anemia, unspecified: Secondary | ICD-10-CM

## 2011-08-19 DIAGNOSIS — E7800 Pure hypercholesterolemia, unspecified: Secondary | ICD-10-CM

## 2011-08-19 DIAGNOSIS — E559 Vitamin D deficiency, unspecified: Secondary | ICD-10-CM

## 2011-08-19 DIAGNOSIS — F411 Generalized anxiety disorder: Secondary | ICD-10-CM

## 2011-08-19 MED ORDER — SERTRALINE HCL 50 MG PO TABS: 50.0000 mg | ORAL_TABLET | Freq: Every day | ORAL | Status: DC

## 2011-08-19 MED ORDER — ALPRAZOLAM 0.5 MG PO TABS: 0.5000 mg | ORAL_TABLET | Freq: Three times a day (TID) | ORAL | Status: DC | PRN
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
## 2011-08-19 NOTE — Patient Instructions (Addendum)
Today we updated your med list in our EPIC system...    Continue your current medications the same...    We decided to change the Vit D supplement to 5,000u OTC supplement DAILY...  We refilled your meds per request...  We discussed the results of your sleep study & decided on a period of diet/ exercise trying to lose some weight to see if this impacts your snoring... You could still consider CPAP, Surgery, Dental apliance later...  Call for any questions...  Let's plan a follow up visit in 6 months.Marland KitchenMarland Kitchen

## 2011-09-12 ENCOUNTER — Other Ambulatory Visit: Payer: Self-pay | Admitting: Pulmonary Disease

## 2012-01-21 ENCOUNTER — Ambulatory Visit: Payer: Self-pay | Admitting: Adult Health

## 2012-01-23 ENCOUNTER — Ambulatory Visit (INDEPENDENT_AMBULATORY_CARE_PROVIDER_SITE_OTHER): Payer: Self-pay | Admitting: Adult Health

## 2012-01-23 ENCOUNTER — Ambulatory Visit: Payer: Self-pay | Admitting: Adult Health

## 2012-01-23 ENCOUNTER — Encounter: Payer: Self-pay | Admitting: Adult Health

## 2012-01-23 ENCOUNTER — Encounter (HOSPITAL_COMMUNITY): Payer: Self-pay | Admitting: Emergency Medicine

## 2012-01-23 ENCOUNTER — Emergency Department (HOSPITAL_COMMUNITY)
Admission: EM | Admit: 2012-01-23 | Discharge: 2012-01-23 | Disposition: A | Discharge status: Home / Self Care | Payer: Self-pay | Attending: Emergency Medicine | Admitting: Emergency Medicine

## 2012-01-23 VITALS — BP 134/82 | HR 84 | Temp 97.8°F | Ht 61.0 in | Wt 154.8 lb

## 2012-01-23 DIAGNOSIS — L02419 Cutaneous abscess of limb, unspecified: Secondary | ICD-10-CM | POA: Insufficient documentation

## 2012-01-23 DIAGNOSIS — Z872 Personal history of diseases of the skin and subcutaneous tissue: Secondary | ICD-10-CM | POA: Insufficient documentation

## 2012-01-23 DIAGNOSIS — L089 Local infection of the skin and subcutaneous tissue, unspecified: Secondary | ICD-10-CM

## 2012-01-23 DIAGNOSIS — E559 Vitamin D deficiency, unspecified: Secondary | ICD-10-CM | POA: Insufficient documentation

## 2012-01-23 DIAGNOSIS — Z79899 Other long term (current) drug therapy: Secondary | ICD-10-CM | POA: Insufficient documentation

## 2012-01-23 DIAGNOSIS — Z8719 Personal history of other diseases of the digestive system: Secondary | ICD-10-CM | POA: Insufficient documentation

## 2012-01-23 DIAGNOSIS — L723 Sebaceous cyst: Secondary | ICD-10-CM

## 2012-01-23 DIAGNOSIS — Z8679 Personal history of other diseases of the circulatory system: Secondary | ICD-10-CM | POA: Insufficient documentation

## 2012-01-23 DIAGNOSIS — J309 Allergic rhinitis, unspecified: Secondary | ICD-10-CM | POA: Insufficient documentation

## 2012-01-23 DIAGNOSIS — Z862 Personal history of diseases of the blood and blood-forming organs and certain disorders involving the immune mechanism: Secondary | ICD-10-CM | POA: Insufficient documentation

## 2012-01-23 DIAGNOSIS — F411 Generalized anxiety disorder: Secondary | ICD-10-CM | POA: Insufficient documentation

## 2012-01-23 DIAGNOSIS — L0390 Cellulitis, unspecified: Secondary | ICD-10-CM

## 2012-01-23 MED ORDER — CEPHALEXIN 250 MG PO CAPS
500.0000 mg | ORAL_CAPSULE | Freq: Once | ORAL | Status: AC
  Administered 2012-01-23: 500 mg via ORAL
  Filled 2012-01-23: qty 2

## 2012-01-23 MED ORDER — CEPHALEXIN 500 MG PO CAPS: 500.0000 mg | ORAL_CAPSULE | Freq: Four times a day (QID) | ORAL | Status: DC

## 2012-01-23 MED ORDER — SULFAMETHOXAZOLE-TMP DS 800-160 MG PO TABS
1.0000 | ORAL_TABLET | Freq: Once | ORAL | Status: AC
  Administered 2012-01-23: 1 via ORAL
  Filled 2012-01-23: qty 1

## 2012-01-23 MED ORDER — HYDROCODONE-ACETAMINOPHEN 5-325 MG PO TABS: 1.0000 | ORAL_TABLET | ORAL | Status: DC | PRN

## 2012-01-23 MED ORDER — HYDROCODONE-ACETAMINOPHEN 5-325 MG PO TABS
2.0000 | ORAL_TABLET | Freq: Once | ORAL | Status: AC
  Administered 2012-01-23: 2 via ORAL
  Filled 2012-01-23: qty 2

## 2012-01-23 MED ORDER — SULFAMETHOXAZOLE-TRIMETHOPRIM 800-160 MG PO TABS: 1.0000 | ORAL_TABLET | Freq: Two times a day (BID) | ORAL | Status: DC

## 2012-01-23 MED ORDER — CEPHALEXIN 500 MG PO CAPS: 500.0000 mg | ORAL_CAPSULE | Freq: Three times a day (TID) | ORAL | Status: DC

## 2012-01-23 NOTE — ED Notes (Signed)
Pt c/o possible abscess to right hip x 3 days; pt denies any drainage

## 2012-01-23 NOTE — Patient Instructions (Addendum)
Keflex 500mg  Three times a day  For 7 days  Warm compresses to area  Wash area daily with gentle soap and water.  If not improving or worsens will need referral for Incision and drainage. Call our office if not improving.  Follow up with Dr. Kriste Basque  In 1 month as planned and As needed

## 2012-01-23 NOTE — Assessment & Plan Note (Signed)
Right lateral Hip ?cyst vs localized insect bite w/ secondary cellulitis  May need I/D if not improving w/ conservative therapy   Plan  Keflex 500mg  Three times a day  For 7 days  Warm compresses to area  Wash area daily with gentle soap and water.  If not improving or worsens will need referral for Incision and drainage. Call our office if not improving.  Follow up with Dr. Kriste Basque  In 1 month as planned and As needed
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
## 2012-01-23 NOTE — ED Provider Notes (Signed)
History   This chart was scribed for Brenda Sou, MD by [name], ED Scribe. This patient was seen in room TR11C/TR11C and the patient's care was started at 2:30PM.   CSN: [id number]  Arrival date & time 01/23/12  1333   None     Chief Complaint  Patient presents with  . Abscess     The history is provided by the patient. No language interpreter was used.    Brenda [name] is a 46 y.o. female who presents to the Emergency Department complaining of a new, painful abscess on the right hip starting 3 days ago. Pt's family member was recently treated for MRSA. Pt denies taking any pain medication for the abscess. Pt denies any drainage or fever.    Pt has h/o allergic rhinitis, pure hypercholesterolemia, esophageal reflux, HA, generalized anxiety disorder, anemia.  Pt denies smoking and alcohol use. Past Medical History  Diagnosis Date  . Allergic rhinitis, cause unspecified   . Pure hypercholesterolemia   . Esophageal reflux   . Headache   . Generalized anxiety disorder   . Unspecified vitamin D deficiency   . Rash and other nonspecific skin eruption   . Anemia, unspecified     Past Surgical History  Procedure Date  . Tendon repair 1990    left index finger    Family History  Problem Relation Age of Onset  . Heart disease Maternal Grandfather   . Asthma Mother   . Diabetes Mother   . Arthritis Mother   . Asthma Sister   . Diabetes Sister   . Allergies Sister     History  Substance Use Topics  . Smoking status: Never Smoker   . Smokeless tobacco: Not on file  . Alcohol Use: Not on file    No OB history provided.   Review of Systems  Constitutional: Negative.  Negative for fever.  HENT: Negative.   Respiratory: Negative.   Cardiovascular: Negative.   Gastrointestinal: Negative.   Musculoskeletal: Negative.   Skin: Wound: abscess to the right outer thigh.   Neurological: Negative.   Hematological: Negative.   Psychiatric/Behavioral: Negative.      Allergies  Codeine phosphate  Home Medications   Current Outpatient Rx  Name  Route  Sig  Dispense  Refill  . ALPRAZOLAM 0.5 MG PO TABS   Oral   Take 1 tablet (0.5 mg total) by mouth 3 (three) times daily as needed for anxiety.   90 tablet   5   . VITAMIN D3 5000 UNITS PO CAPS   Oral   Take 1 capsule by mouth daily.         Marland Kitchen HYDROXYZINE HCL 25 MG PO TABS      take 1 tablet by mouth every 6 hours if needed for itching   100 tablet   6   . WOMENS MULTIVITAMIN PLUS PO TABS   Oral   Take 1 tablet by mouth daily.           Marland Kitchen OMEPRAZOLE MAGNESIUM 20 MG PO TBEC   Oral   Take 1 tablet (20 mg total) by mouth daily.   30 tablet   11   . PRAVASTATIN SODIUM 40 MG PO TABS   Oral   Take 1 tablet (40 mg total) by mouth daily.   30 tablet   11   . SERTRALINE HCL 50 MG PO TABS   Oral   Take 1 tablet (50 mg total) by mouth daily.   30 tablet  5     BP 147/74  Pulse 92  Temp 98.1 F (36.7 C) (Oral)  Resp 16  SpO2 98%  Physical Exam  Nursing note and vitals reviewed. Constitutional: She appears well-developed and well-nourished.  HENT:  Head: Normocephalic and atraumatic.  Eyes: Conjunctivae normal are normal. Pupils are equal, round, and reactive to light.  Neck: Neck supple. No tracheal deviation present. No thyromegaly present.  Cardiovascular: Normal rate and regular rhythm.   No murmur heard. Pulmonary/Chest: Effort normal and breath sounds normal.  Abdominal: Soft. Bowel sounds are normal. She exhibits no distension. There is no tenderness.  Musculoskeletal: Normal range of motion. She exhibits no edema and no tenderness.       Right hip there is approximately 5 cm reddened area, tender, nonfluctuant, mildly swollen. No inguinal nodes. All other extremities without redness swelling or tenderness neurovascularly intact  Neurological: She is alert. Coordination normal.  Skin: Skin is warm and dry. No rash noted.  Psychiatric: She has a normal mood and  affect.    ED Course  Procedures (including critical care time) I looked at reddened area and right hip with ultrasound, no definite fluid collection. DIAGNOSTIC STUDIES: Oxygen Saturation is 98% on room air, normal by my interpretation.    COORDINATION OF CARE:     Labs Reviewed - No data to display No results found.   No diagnosis found.    MDM  Plan prescription Norco, Bactrim DS, Keflex. Warm soaks. Recheck [place] [facility] [facility] 2 days  Diagnosis cellulitis  I personally performed the services described in this documentation, which was scribed in my presence. The recorded information has been reviewed and is accurate.      Brenda Sou, MD 01/23/12 [phone]

## 2012-01-23 NOTE — ED Notes (Signed)
Pt has tender, red, firm area on right hip x 3 days. Her friend/family recently was tx for MRSA.

## 2012-02-21 ENCOUNTER — Ambulatory Visit: Payer: Self-pay | Admitting: Pulmonary Disease

## 2012-02-25 ENCOUNTER — Ambulatory Visit: Payer: Self-pay | Admitting: Pulmonary Disease

## 2012-03-14 ENCOUNTER — Other Ambulatory Visit: Payer: Self-pay | Admitting: Pulmonary Disease

## 2012-03-17 ENCOUNTER — Telehealth: Payer: Self-pay | Admitting: Pulmonary Disease

## 2012-03-17 MED ORDER — ALPRAZOLAM 0.5 MG PO TABS: 0.5000 mg | ORAL_TABLET | Freq: Three times a day (TID) | ORAL | Status: DC | PRN

## 2012-03-17 MED ORDER — PRAVASTATIN SODIUM 40 MG PO TABS: 40.0000 mg | ORAL_TABLET | Freq: Every day | ORAL | Status: DC

## 2012-03-17 NOTE — Telephone Encounter (Signed)
Refills have been sent and called to the pharmacy.  i have called and spoke with the pt and she is aware.

## 2012-03-17 NOTE — Telephone Encounter (Signed)
Pt was last seen on 01/23/12. Last fill of Xanax was on 08/19/11 #90 with 5 refills.  Pravastatin has already been sent in.  Please advise if it's okay to fill Xanax.

## 2012-04-01 ENCOUNTER — Ambulatory Visit: Payer: Self-pay | Admitting: Pulmonary Disease

## 2012-04-07 ENCOUNTER — Ambulatory Visit: Payer: Self-pay | Admitting: Pulmonary Disease

## 2012-04-23 ENCOUNTER — Other Ambulatory Visit: Payer: Self-pay | Admitting: Pulmonary Disease

## 2012-04-23 ENCOUNTER — Telehealth: Payer: Self-pay | Admitting: Pulmonary Disease

## 2012-04-23 MED ORDER — ALPRAZOLAM 0.5 MG PO TABS: 0.5000 mg | ORAL_TABLET | Freq: Three times a day (TID) | ORAL | Status: DC | PRN

## 2012-04-23 NOTE — Telephone Encounter (Signed)
Refill of the alprazolam has been called to the pharmacy.  i called and spoke with pt and she is aware.  Nothing further is needed.

## 2012-05-06 ENCOUNTER — Other Ambulatory Visit: Payer: Self-pay | Admitting: Pulmonary Disease

## 2012-05-06 ENCOUNTER — Ambulatory Visit: Payer: Self-pay | Admitting: Pulmonary Disease

## 2012-05-12 ENCOUNTER — Ambulatory Visit: Payer: Self-pay | Admitting: Pulmonary Disease

## 2012-05-31 ENCOUNTER — Other Ambulatory Visit: Payer: Self-pay | Admitting: Pulmonary Disease

## 2012-06-01 ENCOUNTER — Encounter: Payer: Self-pay | Admitting: *Deleted

## 2012-06-01 ENCOUNTER — Encounter: Payer: Self-pay | Admitting: Pulmonary Disease

## 2012-06-01 ENCOUNTER — Telehealth: Payer: Self-pay | Admitting: Pulmonary Disease

## 2012-06-01 NOTE — Telephone Encounter (Signed)
Per SN---  Not able to fill this medication at this time.  She will have to come in for her appt on 5/6 for any further refills.  Called and spoke with pt and she is aware. Nothing further is needed.

## 2012-06-01 NOTE — Telephone Encounter (Signed)
Spoke with pt to let her know that we will send this message to SN to get approval to refill. She verbalized understanding.  Last OV 08/19/11 - was told to follow up in 6 mo. Pending OV 06/16/12 Last fill 04/23/12   Please advise if this is okay to refill.

## 2012-06-16 ENCOUNTER — Ambulatory Visit (INDEPENDENT_AMBULATORY_CARE_PROVIDER_SITE_OTHER): Payer: Self-pay | Admitting: Pulmonary Disease

## 2012-06-16 ENCOUNTER — Encounter: Payer: Self-pay | Admitting: Pulmonary Disease

## 2012-06-16 VITALS — BP 142/88 | HR 84 | Temp 99.2°F | Ht 60.0 in | Wt 151.0 lb

## 2012-06-16 DIAGNOSIS — R51 Headache: Secondary | ICD-10-CM

## 2012-06-16 DIAGNOSIS — E559 Vitamin D deficiency, unspecified: Secondary | ICD-10-CM

## 2012-06-16 DIAGNOSIS — M1990 Unspecified osteoarthritis, unspecified site: Secondary | ICD-10-CM | POA: Insufficient documentation

## 2012-06-16 DIAGNOSIS — E7800 Pure hypercholesterolemia, unspecified: Secondary | ICD-10-CM

## 2012-06-16 DIAGNOSIS — D649 Anemia, unspecified: Secondary | ICD-10-CM

## 2012-06-16 DIAGNOSIS — F411 Generalized anxiety disorder: Secondary | ICD-10-CM

## 2012-06-16 DIAGNOSIS — K219 Gastro-esophageal reflux disease without esophagitis: Secondary | ICD-10-CM

## 2012-06-16 MED ORDER — SERTRALINE HCL 50 MG PO TABS: 50.0000 mg | ORAL_TABLET | Freq: Every day | ORAL | Status: DC

## 2012-06-16 MED ORDER — ALPRAZOLAM 0.5 MG PO TABS: 0.5000 mg | ORAL_TABLET | Freq: Three times a day (TID) | ORAL | Status: DC | PRN

## 2012-06-16 NOTE — Patient Instructions (Addendum)
Today we updated your med list in our EPIC system...    Continue your current medications the same...    We refilled your meds per request...  Please return to our lab one morning this week for your FASTING blood work...    We will contact you w/ the results when available...   Call for any questions...  Let's plan a follow up visit in 6mo, sooner if needed for problems...   
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
## 2012-06-21 ENCOUNTER — Emergency Department (HOSPITAL_COMMUNITY)
Admission: EM | Admit: 2012-06-21 | Discharge: 2012-06-21 | Disposition: A | Discharge status: Home / Self Care | Payer: Self-pay | Attending: Emergency Medicine | Admitting: Emergency Medicine

## 2012-06-21 ENCOUNTER — Emergency Department (HOSPITAL_COMMUNITY): Payer: Self-pay

## 2012-06-21 ENCOUNTER — Encounter (HOSPITAL_COMMUNITY): Payer: Self-pay | Admitting: Emergency Medicine

## 2012-06-21 DIAGNOSIS — F411 Generalized anxiety disorder: Secondary | ICD-10-CM | POA: Insufficient documentation

## 2012-06-21 DIAGNOSIS — K219 Gastro-esophageal reflux disease without esophagitis: Secondary | ICD-10-CM | POA: Insufficient documentation

## 2012-06-21 DIAGNOSIS — Z862 Personal history of diseases of the blood and blood-forming organs and certain disorders involving the immune mechanism: Secondary | ICD-10-CM | POA: Insufficient documentation

## 2012-06-21 DIAGNOSIS — Z79899 Other long term (current) drug therapy: Secondary | ICD-10-CM | POA: Insufficient documentation

## 2012-06-21 DIAGNOSIS — Y9301 Activity, walking, marching and hiking: Secondary | ICD-10-CM | POA: Insufficient documentation

## 2012-06-21 DIAGNOSIS — E559 Vitamin D deficiency, unspecified: Secondary | ICD-10-CM | POA: Insufficient documentation

## 2012-06-21 DIAGNOSIS — Y92009 Unspecified place in unspecified non-institutional (private) residence as the place of occurrence of the external cause: Secondary | ICD-10-CM | POA: Insufficient documentation

## 2012-06-21 DIAGNOSIS — W010XXA Fall on same level from slipping, tripping and stumbling without subsequent striking against object, initial encounter: Secondary | ICD-10-CM | POA: Insufficient documentation

## 2012-06-21 DIAGNOSIS — Z8669 Personal history of other diseases of the nervous system and sense organs: Secondary | ICD-10-CM | POA: Insufficient documentation

## 2012-06-21 DIAGNOSIS — Z872 Personal history of diseases of the skin and subcutaneous tissue: Secondary | ICD-10-CM | POA: Insufficient documentation

## 2012-06-21 DIAGNOSIS — E7800 Pure hypercholesterolemia, unspecified: Secondary | ICD-10-CM | POA: Insufficient documentation

## 2012-06-21 DIAGNOSIS — S8000XA Contusion of unspecified knee, initial encounter: Secondary | ICD-10-CM | POA: Insufficient documentation

## 2012-06-21 DIAGNOSIS — Z8709 Personal history of other diseases of the respiratory system: Secondary | ICD-10-CM | POA: Insufficient documentation

## 2012-06-21 DIAGNOSIS — S8001XA Contusion of right knee, initial encounter: Secondary | ICD-10-CM

## 2012-06-21 MED ORDER — OXYCODONE-ACETAMINOPHEN 5-325 MG PO TABS
2.0000 | ORAL_TABLET | Freq: Once | ORAL | Status: AC
  Administered 2012-06-21: 2 via ORAL
  Filled 2012-06-21: qty 2

## 2012-06-21 MED ORDER — OXYCODONE-ACETAMINOPHEN 5-325 MG PO TABS: 1.0000 | ORAL_TABLET | ORAL | Status: DC | PRN

## 2012-06-21 NOTE — ED Notes (Signed)
Patient was at home playing with her cousin and tripped over a vacuum landing on R knee. Denies injury to any other body parts, denies LOC. Denies taking anything for pain PTA. EMS reported patient walked outside to meet them upon arrival. Swelling noted to R knee, ice pack given.

## 2012-06-21 NOTE — ED Provider Notes (Signed)
Medical screening examination/treatment/procedure(s) were performed by non-physician practitioner and as supervising physician I was immediately available for consultation/collaboration.  Wright Gravely, MD 06/21/12 0553 

## 2012-06-21 NOTE — ED Notes (Signed)
Patient transported to X-ray 

## 2012-06-21 NOTE — ED Notes (Signed)
Per EMS pt tripped over a vacuum cleaner and fell on her right knee  Pt has swelling without deformity noted to her right knee

## 2012-06-21 NOTE — ED Provider Notes (Signed)
History     CSN: [id number]  Arrival date & time 06/21/12  0151   First MD Initiated Contact with Patient 06/21/12 0224      Chief Complaint  Patient presents with  . Fall  . Knee Pain    (Consider location/radiation/quality/duration/timing/severity/associated sxs/prior treatment) HPI Comments: Patient states, that she fell over a vacuum cleaner, landing directly on her right knee.  She was initially in the psoriatic to the event, but noticed significant swelling and pain.  Almost immediately.  She has not taken any medication.  Prior to arrival.  For pain relief  Patient is a 47 y.o. female presenting with fall and knee pain. The history is provided by the patient.  Fall The accident occurred 1 to 2 hours ago. The fall occurred while walking. She fell from a height of 1 to 2 ft. She landed on a hard floor. There was no blood loss. The point of impact was the right knee. The pain is present in the right knee. The pain is at a severity of 8/10. The pain is moderate. She was ambulatory at the scene. There was no entrapment after the fall. There was no drug use involved in the accident. There was no alcohol use involved in the accident. The symptoms are aggravated by activity. She has tried nothing for the symptoms.  Knee Pain   Past Medical History  Diagnosis Date  . Allergic rhinitis, cause unspecified   . Pure hypercholesterolemia   . Esophageal reflux   . Headache   . Generalized anxiety disorder   . Unspecified vitamin D deficiency   . Rash and other nonspecific skin eruption   . Anemia, unspecified     Past Surgical History  Procedure Laterality Date  . Tendon repair  1990    left index finger    Family History  Problem Relation Age of Onset  . Heart disease Maternal Grandfather   . Asthma Mother   . Diabetes Mother   . Arthritis Mother   . Asthma Sister   . Diabetes Sister   . Allergies Sister     History  Substance Use Topics  . Smoking status: Never Smoker    . Smokeless tobacco: Not on file  . Alcohol Use: Yes     Comment: occasionally    OB History   Grav Para Term Preterm Abortions TAB SAB Ect Mult Living                  Review of Systems  Constitutional: Positive for activity change.  Cardiovascular: Positive for leg swelling.  Musculoskeletal: Positive for joint swelling and gait problem.  All other systems reviewed and are negative.    Allergies  Codeine phosphate  Home Medications   Current Outpatient Rx  Name  Route  Sig  Dispense  Refill  . ALPRAZolam (XANAX) 0.5 MG tablet   Oral   Take 1 tablet (0.5 mg total) by mouth 3 (three) times daily as needed for anxiety.   90 tablet   5   . Cholecalciferol (VITAMIN D3) 5000 UNITS CAPS   Oral   Take 1 capsule by mouth daily.         . Multiple Vitamins-Minerals (WOMENS MULTIVITAMIN PLUS) TABS   Oral   Take 1 tablet by mouth daily.           Marland Kitchen omeprazole (PRILOSEC OTC) 20 MG tablet   Oral   Take 1 tablet (20 mg total) by mouth daily.   30 tablet  11   . pravastatin (PRAVACHOL) 40 MG tablet   Oral   Take 1 tablet (40 mg total) by mouth daily.   30 tablet   2   . sertraline (ZOLOFT) 50 MG tablet   Oral   Take 1 tablet (50 mg total) by mouth daily.   30 tablet   5   . oxyCODONE-acetaminophen (PERCOCET/ROXICET) 5-325 MG per tablet   Oral   Take 1 tablet by mouth every 4 (four) hours as needed for pain.   30 tablet   0     BP 170/83  Pulse 98  Temp(Src) 97.6 F (36.4 C) (Oral)  Resp 18  SpO2 100%  LMP 05/26/2012  Physical Exam  Nursing note and vitals reviewed. Constitutional: She appears well-developed and well-nourished.  HENT:  Head: Normocephalic.  Eyes: Pupils are equal, round, and reactive to light.  Neck: Normal range of motion.  Cardiovascular: Normal rate and regular rhythm.   Pulmonary/Chest: Effort normal.  Musculoskeletal: She exhibits edema and tenderness.       Legs: Entire patella, swollen, tender, ecchymotic distal  pulses intact    ED Course  Procedures (including critical care time)  Labs Reviewed - No data to display Dg Knee Complete 4 Views Right  06/21/2012  *RADIOLOGY REPORT*  Clinical Data: Blow to the right knee with anterior pain.  RIGHT KNEE - COMPLETE 4+ VIEW  Comparison: None.  Findings: No fracture, dislocation or joint effusion is identified. There is marked soft tissue swelling anterior to the patella. Enthesopathic change about the patella is noted.  IMPRESSION:  1.  Marked soft tissue swelling anterior to the patella is compatible with hemorrhagic bursitis in the setting of trauma. 2.  Negative for fracture.   Original Report Authenticated By: Holley Dexter, M.D.      1. Knee contusion, right, initial encounter       MDM   We'll apply a knee sleeve for some compression effect.  Continue ice for the rest of the night.  Pain control, and then to mark current heat, and movement to help reabsorb refer to orthopedics for followup as well        Arman Filter, NP 06/21/12 [phone]

## 2012-06-22 ENCOUNTER — Other Ambulatory Visit: Payer: Self-pay | Admitting: Pulmonary Disease

## 2012-07-12 ENCOUNTER — Other Ambulatory Visit: Payer: Self-pay | Admitting: Pulmonary Disease

## 2012-07-13 ENCOUNTER — Telehealth: Payer: Self-pay | Admitting: Pulmonary Disease

## 2012-07-13 MED ORDER — SULFAMETHOXAZOLE-TRIMETHOPRIM 800-160 MG PO TABS: 1.0000 | ORAL_TABLET | Freq: Two times a day (BID) | ORAL | Status: DC

## 2012-07-13 NOTE — Telephone Encounter (Signed)
Called and spoke with pt and she stated that she has been sick x 3 days with cough, nasal drainage that is yellow in color.  Pt c/o low grade fever, chills.  Pt stated that everyone in her house has been sick with the same thing.  Family has been treated with septra ds and pt is requesting that something be called in for her to [facility] [address].  SN please advise. Thanks  Allergies  Allergen Reactions  . Codeine Phosphate     REACTION: vomiting

## 2012-07-13 NOTE — Telephone Encounter (Signed)
Per SN--  Ok to send in septra ds  #14  1 po bid and this has been sent in to [facility] on [address] per pts request.  i called and spoke with pt and she is aware and nothing further is needed.

## 2012-08-11 ENCOUNTER — Other Ambulatory Visit: Payer: Self-pay | Admitting: Pulmonary Disease

## 2012-10-23 ENCOUNTER — Telehealth: Payer: Self-pay | Admitting: Pulmonary Disease

## 2012-10-23 NOTE — Telephone Encounter (Signed)
I spoke with pt. She stated her mother told her we called last week. I do not see anything in epic. Nothing further needed

## 2012-12-08 ENCOUNTER — Telehealth: Payer: Self-pay | Admitting: Pulmonary Disease

## 2012-12-08 ENCOUNTER — Other Ambulatory Visit: Payer: Self-pay | Admitting: Pulmonary Disease

## 2012-12-08 MED ORDER — ALPRAZOLAM 0.5 MG PO TABS: 0.5000 mg | ORAL_TABLET | Freq: Three times a day (TID) | ORAL | Status: DC | PRN

## 2012-12-08 MED ORDER — SERTRALINE HCL 50 MG PO TABS: 50.0000 mg | ORAL_TABLET | Freq: Every day | ORAL | Status: DC

## 2012-12-08 NOTE — Telephone Encounter (Signed)
Rx for Zoloft has been sent in.  SN please advise on Xanax refill.  Last OV 06/16/12 Pending OV 12/22/12 Last fill 06/16/12 #90 with 5 additional refills

## 2012-12-08 NOTE — Telephone Encounter (Signed)
Pt returned call. No answer in triage.  Advised pt that Rx;s have been called in.  Pt verbalized understanding.  Antionette Fairy

## 2012-12-08 NOTE — Telephone Encounter (Signed)
Per SN--  Ok to refill the xanax  This time with no refills.  thanks

## 2012-12-08 NOTE — Telephone Encounter (Signed)
lmomtcb x1 for pt--rx's have been called in

## 2012-12-22 ENCOUNTER — Ambulatory Visit: Payer: Self-pay | Admitting: Pulmonary Disease

## 2012-12-25 ENCOUNTER — Telehealth: Payer: Self-pay | Admitting: Pulmonary Disease

## 2012-12-25 DIAGNOSIS — F419 Anxiety disorder, unspecified: Secondary | ICD-10-CM

## 2012-12-25 DIAGNOSIS — D649 Anemia, unspecified: Secondary | ICD-10-CM

## 2012-12-25 DIAGNOSIS — E7800 Pure hypercholesterolemia, unspecified: Secondary | ICD-10-CM

## 2012-12-25 DIAGNOSIS — E559 Vitamin D deficiency, unspecified: Secondary | ICD-10-CM

## 2012-12-25 DIAGNOSIS — K219 Gastro-esophageal reflux disease without esophagitis: Secondary | ICD-10-CM

## 2012-12-25 NOTE — Telephone Encounter (Signed)
Orders have been placed. Pt is aware. 

## 2012-12-28 ENCOUNTER — Ambulatory Visit: Payer: Self-pay | Admitting: Adult Health

## 2013-01-01 ENCOUNTER — Ambulatory Visit: Payer: Self-pay | Admitting: Adult Health

## 2013-01-05 ENCOUNTER — Telehealth: Payer: Self-pay | Admitting: Pulmonary Disease

## 2013-01-05 MED ORDER — FIRST-DUKES MOUTHWASH MT SUSP: OROMUCOSAL | Status: DC

## 2013-01-05 MED ORDER — AZITHROMYCIN 250 MG PO TABS: ORAL_TABLET | ORAL | Status: AC

## 2013-01-05 NOTE — Telephone Encounter (Signed)
I spoke with the pt and she is c/o having been exposed to strep throat by her niece. She states she is now having a sore throat, dry cough, and PND. She does not want to be sick over the holidays and is requesting an rx be sent. Please advise. Carron Curie, CMA Allergies  Allergen Reactions  . Codeine Phosphate     REACTION: vomiting

## 2013-01-05 NOTE — Telephone Encounter (Signed)
Pt has cancelled her last 3 appts >> 11/11 w/ SN, 11/17 & 11/21 with TP No openings today with either provider LMOM TCB x1

## 2013-01-05 NOTE — Telephone Encounter (Signed)
Per SN---  zpak #1  Take as directed MMW  #120 ml   1 tsp gargle and swallow four times daily as needed with 1 refill.  thanks

## 2013-01-05 NOTE — Telephone Encounter (Signed)
Pt is aware of SN recs. Rx's have been sent in.

## 2013-01-13 ENCOUNTER — Ambulatory Visit: Payer: Self-pay | Admitting: Adult Health

## 2013-01-19 ENCOUNTER — Ambulatory Visit: Payer: Self-pay | Admitting: Adult Health

## 2013-02-11 ENCOUNTER — Other Ambulatory Visit: Payer: Self-pay | Admitting: Pulmonary Disease

## 2013-02-16 ENCOUNTER — Telehealth: Payer: Self-pay | Admitting: Pulmonary Disease

## 2013-02-16 NOTE — Telephone Encounter (Signed)
Alprazolam 0.5mg  rx was called into [facility] on 02/11/2013. I called [facility], was advised they did receive this rx and medication is ready.   lmomtcb for pt

## 2013-02-17 NOTE — Telephone Encounter (Signed)
Pt is aware. Jennifer Castillo, CMA  

## 2013-03-08 ENCOUNTER — Ambulatory Visit (INDEPENDENT_AMBULATORY_CARE_PROVIDER_SITE_OTHER): Payer: Self-pay | Admitting: Pulmonary Disease

## 2013-03-08 ENCOUNTER — Encounter: Payer: Self-pay | Admitting: Pulmonary Disease

## 2013-03-08 ENCOUNTER — Other Ambulatory Visit (INDEPENDENT_AMBULATORY_CARE_PROVIDER_SITE_OTHER): Payer: Self-pay

## 2013-03-08 VITALS — BP 154/90 | HR 92 | Temp 97.5°F | Ht 60.0 in | Wt 147.6 lb

## 2013-03-08 DIAGNOSIS — F411 Generalized anxiety disorder: Secondary | ICD-10-CM

## 2013-03-08 DIAGNOSIS — K529 Noninfective gastroenteritis and colitis, unspecified: Secondary | ICD-10-CM

## 2013-03-08 DIAGNOSIS — I10 Essential (primary) hypertension: Secondary | ICD-10-CM

## 2013-03-08 DIAGNOSIS — M1990 Unspecified osteoarthritis, unspecified site: Secondary | ICD-10-CM

## 2013-03-08 DIAGNOSIS — K5289 Other specified noninfective gastroenteritis and colitis: Secondary | ICD-10-CM

## 2013-03-08 DIAGNOSIS — E559 Vitamin D deficiency, unspecified: Secondary | ICD-10-CM

## 2013-03-08 DIAGNOSIS — E7800 Pure hypercholesterolemia, unspecified: Secondary | ICD-10-CM

## 2013-03-08 DIAGNOSIS — D649 Anemia, unspecified: Secondary | ICD-10-CM

## 2013-03-08 DIAGNOSIS — K219 Gastro-esophageal reflux disease without esophagitis: Secondary | ICD-10-CM

## 2013-03-08 LAB — BASIC METABOLIC PANEL
BUN: 8 mg/dL (ref 6–23)
CHLORIDE: 104 meq/L (ref 96–112)
CO2: 24 meq/L (ref 19–32)
Calcium: 9.1 mg/dL (ref 8.4–10.5)
Creatinine, Ser: 0.6 mg/dL (ref 0.4–1.2)
GFR: 144.61 mL/min (ref >60.00)
Glucose, Bld: 105 mg/dL — ABNORMAL HIGH (ref 70–99)
Potassium: 3.7 mEq/L (ref 3.5–5.1)
SODIUM: 137 meq/L (ref 135–145)

## 2013-03-08 LAB — IBC PANEL
Iron: 24 ug/dL — ABNORMAL LOW (ref 42–145)
Saturation Ratios: 6.4 % — ABNORMAL LOW (ref 20.0–50.0)
TRANSFERRIN: 268.3 mg/dL (ref 212.0–360.0)

## 2013-03-08 LAB — LIPID PANEL
CHOL/HDL RATIO: 3
Cholesterol: 127 mg/dL (ref 0–200)
HDL: 45.5 mg/dL (ref >39.00)
LDL Cholesterol: 70 mg/dL (ref 0–99)
Triglycerides: 56 mg/dL (ref 0.0–149.0)
VLDL: 11.2 mg/dL (ref 0.0–40.0)

## 2013-03-08 LAB — CBC WITH DIFFERENTIAL/PLATELET
Basophils Absolute: 0 10*3/uL (ref 0.0–0.1)
Basophils Relative: 0.3 % (ref 0.0–3.0)
EOS PCT: 3.5 % (ref 0.0–5.0)
Eosinophils Absolute: 0.2 10*3/uL (ref 0.0–0.7)
HEMATOCRIT: 37.7 % (ref 36.0–46.0)
Hemoglobin: 12.6 g/dL (ref 12.0–15.0)
LYMPHS ABS: 1.8 10*3/uL (ref 0.7–4.0)
LYMPHS PCT: 26.5 % (ref 12.0–46.0)
MCHC: 33.3 g/dL (ref 30.0–36.0)
MCV: 79.4 fl (ref 78.0–100.0)
MONOS PCT: 8.5 % (ref 3.0–12.0)
Monocytes Absolute: 0.6 10*3/uL (ref 0.1–1.0)
NEUTROS PCT: 61.2 % (ref 43.0–77.0)
Neutro Abs: 4.1 10*3/uL (ref 1.4–7.7)
PLATELETS: 359 10*3/uL (ref 150.0–400.0)
RBC: 4.75 Mil/uL (ref 3.87–5.11)
RDW: 13.4 % (ref 11.5–14.6)
WBC: 6.7 10*3/uL (ref 4.5–10.5)

## 2013-03-08 LAB — HEPATIC FUNCTION PANEL
ALK PHOS: 55 U/L (ref 39–117)
ALT: 21 U/L (ref 0–35)
AST: 20 U/L (ref 0–37)
Albumin: 4.3 g/dL (ref 3.5–5.2)
BILIRUBIN DIRECT: 0 mg/dL (ref 0.0–0.3)
Total Bilirubin: 0.6 mg/dL (ref 0.3–1.2)
Total Protein: 8 g/dL (ref 6.0–8.3)

## 2013-03-08 LAB — TSH: TSH: 0.75 u[IU]/mL (ref 0.35–5.50)

## 2013-03-08 MED ORDER — SERTRALINE HCL 100 MG PO TABS: 100.0000 mg | ORAL_TABLET | Freq: Every day | ORAL | Status: DC

## 2013-03-08 MED ORDER — AMLODIPINE BESYLATE 5 MG PO TABS: 5.0000 mg | ORAL_TABLET | Freq: Every day | ORAL | Status: DC

## 2013-03-08 MED ORDER — ALPRAZOLAM 0.5 MG PO TABS: 0.5000 mg | ORAL_TABLET | Freq: Three times a day (TID) | ORAL | Status: DC | PRN

## 2013-03-08 MED ORDER — LOPERAMIDE HCL 2 MG PO CAPS: 2.0000 mg | ORAL_CAPSULE | Freq: Four times a day (QID) | ORAL | Status: DC | PRN

## 2013-03-08 MED ORDER — DICYCLOMINE HCL 10 MG PO CAPS: 10.0000 mg | ORAL_CAPSULE | Freq: Four times a day (QID) | ORAL | Status: DC | PRN

## 2013-03-08 MED ORDER — PRAVASTATIN SODIUM 40 MG PO TABS: ORAL_TABLET | ORAL | Status: DC

## 2013-03-08 MED ORDER — MELOXICAM 7.5 MG PO TABS: 7.5000 mg | ORAL_TABLET | Freq: Every day | ORAL | Status: DC

## 2013-03-08 NOTE — Patient Instructions (Signed)
Today we updated your med list in our EPIC system...    Continue your current medications the same...  Today we did your follow up FASTING blood work>    We will contact you w/ the results when available...   For your BP>  Continue to monitor your BP at home...    We decided to start you on AMLODIPINE 5mg - one tab daily...  For the gastroenteritis & diarrhea>>    Clear liq diet for now...    Use the Immodium one tab every 6h as needed for the watery diarrhea...    Use the BENTYL (Dicyclomine) 10mg  every 6h as needed for the cramping...    Get the OTC Probiotic- ALIGN- and take one daily...    You may also find Activia yogurt to be helpful...  For the arthritis pain>>    Try the new MOBIC (Meloxicam) 7.5mg  one tab daily as needed for arthritis pain...  Finally we increased your Zoloft to 100mg /d...  Call for any questions.Marland KitchenMarland Kitchen
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
## 2013-03-09 LAB — VITAMIN D 25 HYDROXY (VIT D DEFICIENCY, FRACTURES): VIT D 25 HYDROXY: 67 ng/mL (ref 30–89)

## 2013-03-11 ENCOUNTER — Telehealth: Payer: Self-pay | Admitting: Pulmonary Disease

## 2013-03-11 NOTE — Telephone Encounter (Signed)
Called and spoke w/ pt. She c/o knee pain and it looks swollen, body aches. She has been taking aleve but not helping. She reports d/t insurance she does not have ortho doc. Pt asking to have something called in. Please advise SN thanks Last OV 03/08/13  Allergies  Allergen Reactions  . Codeine Phosphate     REACTION: vomiting

## 2013-03-11 NOTE — Telephone Encounter (Signed)
Per SN---  Tramadol 50 m  1 po TID prn pain  #90 Needs ortho to eval.  SN cannot fill the percocet.

## 2013-03-11 NOTE — Telephone Encounter (Signed)
LMTC x 1  

## 2013-03-12 MED ORDER — TRAMADOL HCL 50 MG PO TABS: 50.0000 mg | ORAL_TABLET | Freq: Three times a day (TID) | ORAL | Status: DC | PRN

## 2013-03-12 NOTE — Telephone Encounter (Signed)
Pt is returning call.  Pt states she will be out for a little while & gives [place] permission to speak with her mother, Tami Lin.  Brenda [name]

## 2013-03-12 NOTE — Telephone Encounter (Signed)
lmomtcb x2 for pt 

## 2013-03-12 NOTE — Telephone Encounter (Signed)
Spoke with pt and is aware of recs. RX has been called in. Nothing further needed

## 2013-03-22 ENCOUNTER — Telehealth: Payer: Self-pay | Admitting: Pulmonary Disease

## 2013-03-22 MED ORDER — ALPRAZOLAM 0.5 MG PO TABS: 0.5000 mg | ORAL_TABLET | Freq: Three times a day (TID) | ORAL | Status: DC | PRN

## 2013-03-22 NOTE — Telephone Encounter (Signed)
Spoke with pt in the lobby. She would rather her rx be called in. This has been done. A current list of her medications has been given to her. Nothing further was needed.

## 2013-04-01 ENCOUNTER — Other Ambulatory Visit: Payer: Self-pay | Admitting: Pulmonary Disease

## 2013-04-01 DIAGNOSIS — K219 Gastro-esophageal reflux disease without esophagitis: Secondary | ICD-10-CM

## 2013-04-01 DIAGNOSIS — D649 Anemia, unspecified: Secondary | ICD-10-CM

## 2013-04-02 ENCOUNTER — Encounter: Payer: Self-pay | Admitting: Gastroenterology

## 2013-04-07 ENCOUNTER — Ambulatory Visit: Payer: Self-pay | Admitting: Nurse Practitioner

## 2013-04-09 ENCOUNTER — Telehealth: Payer: Self-pay | Admitting: Pulmonary Disease

## 2013-04-09 NOTE — Telephone Encounter (Signed)
i called spoke with pt. She reports she was returning a call in regards to her GI appt. Please advise PCC's thanks

## 2013-04-09 NOTE — Telephone Encounter (Signed)
lmomtcb x1 

## 2013-04-09 NOTE — Telephone Encounter (Signed)
lmomtcb Sally E Ottinger ° °

## 2013-04-09 NOTE — Telephone Encounter (Signed)
Pt returning call.Brenda [name] ° °

## 2013-04-12 NOTE — Telephone Encounter (Signed)
Returned patient's call in reference to GI appointment. Pt can not come up with the required amount needed to be seen at [facility]. Advised patient to call [facility] [phone] and to let me know what they tell her. [name]

## 2013-04-14 NOTE — Telephone Encounter (Signed)
Called and LM for pt to return my call. [name]

## 2013-04-20 NOTE — Telephone Encounter (Signed)
Called and spoke with [facility] and they require down payment as well. Pt stated that she was unable to do this.  Patient stated that she didn't qualify for Medicaid and advised her to contact [facility] and make an appointment to see if she will qualify for that. Gave patient the phone number to call. Pt stated that she would. Called and Left messages x 3 for her to return my call on the below number for the patient and have not received a return call.   I can't get her an appointment with any GI here locally due to her not having any insurance at this time. Pt states that she can not come up with the down payment.   Please advise. [name]  Left another message today for her to return my call. [name]

## 2013-04-20 NOTE — Telephone Encounter (Signed)
There has been multiple messages left with no return calls Brenda [name]

## 2013-04-20 NOTE — Telephone Encounter (Signed)
PCC's please advise if this message has been taken care of. Thanks.

## 2013-07-01 ENCOUNTER — Telehealth: Payer: Self-pay | Admitting: Pulmonary Disease

## 2013-07-01 NOTE — Telephone Encounter (Signed)
Spoke with the pt  She states that she changed pharmacies and needs new rx for zoloft called to [facility] was called in x 1 only pending her appt to est with Dr. Ronnald Ramp Pt aware  Nothing further needed

## 2013-07-06 ENCOUNTER — Ambulatory Visit: Payer: Self-pay | Admitting: Physician Assistant

## 2013-07-09 ENCOUNTER — Ambulatory Visit: Payer: Self-pay | Admitting: Family Medicine

## 2013-07-09 ENCOUNTER — Ambulatory Visit: Payer: Self-pay | Admitting: Internal Medicine

## 2013-07-09 DIAGNOSIS — Z0289 Encounter for other administrative examinations: Secondary | ICD-10-CM

## 2013-07-22 ENCOUNTER — Emergency Department (HOSPITAL_COMMUNITY)
Admission: EM | Admit: 2013-07-22 | Discharge: 2013-07-22 | Disposition: A | Discharge status: Home / Self Care | Payer: Self-pay | Attending: Emergency Medicine | Admitting: Emergency Medicine

## 2013-07-22 ENCOUNTER — Encounter (HOSPITAL_COMMUNITY): Payer: Self-pay | Admitting: Emergency Medicine

## 2013-07-22 DIAGNOSIS — Z79899 Other long term (current) drug therapy: Secondary | ICD-10-CM | POA: Insufficient documentation

## 2013-07-22 DIAGNOSIS — Z862 Personal history of diseases of the blood and blood-forming organs and certain disorders involving the immune mechanism: Secondary | ICD-10-CM | POA: Insufficient documentation

## 2013-07-22 DIAGNOSIS — E7800 Pure hypercholesterolemia, unspecified: Secondary | ICD-10-CM | POA: Insufficient documentation

## 2013-07-22 DIAGNOSIS — Z8639 Personal history of other endocrine, nutritional and metabolic disease: Secondary | ICD-10-CM | POA: Insufficient documentation

## 2013-07-22 DIAGNOSIS — J329 Chronic sinusitis, unspecified: Secondary | ICD-10-CM | POA: Insufficient documentation

## 2013-07-22 DIAGNOSIS — K219 Gastro-esophageal reflux disease without esophagitis: Secondary | ICD-10-CM | POA: Insufficient documentation

## 2013-07-22 DIAGNOSIS — Z791 Long term (current) use of non-steroidal anti-inflammatories (NSAID): Secondary | ICD-10-CM | POA: Insufficient documentation

## 2013-07-22 DIAGNOSIS — F411 Generalized anxiety disorder: Secondary | ICD-10-CM | POA: Insufficient documentation

## 2013-07-22 MED ORDER — AMOXICILLIN 500 MG PO CAPS: 500.0000 mg | ORAL_CAPSULE | Freq: Three times a day (TID) | ORAL | Status: DC

## 2013-07-22 NOTE — Progress Notes (Signed)
[facility] CL spoke with patient about [facility]. Patient pcp on card is [facility]. Patient has a new patient apt with them on 7/14. CL went over program with patient and answered questions that patient had.

## 2013-07-22 NOTE — ED Notes (Signed)
Pt reports 2 day hx of scratchy throat, sinus pressure, ear pressure

## 2013-07-22 NOTE — ED Provider Notes (Signed)
CSN: [id number]     Arrival date & time 07/22/13  1124 History   First MD Initiated Contact with Patient 07/22/13 1247     Chief Complaint  Patient presents with  . Cough  . Sore Throat  . Facial Pain     (Consider location/radiation/quality/duration/timing/severity/associated sxs/prior Treatment) The history is provided by the patient and medical records.   This is a 48 y.o. F with PMH significant for allergic rhinitis, GERD, anxiety, anemia, presenting to the ED for URI type sx-- specifically non-productive cough, sore throat, PND, sinus pressure, nasal congestion and ear pressure.  Pt states actually has been present for 10 days, worse over the past 2 making it difficulty for her to sleep.  She has tried using multiple OTC medications-- mucinex, sudafed, tylenol cold/sinus, nasonex, saline nasal spray, etc without noted improvement.  Denies fever or chills.  No chest pain or SOB.    Past Medical History  Diagnosis Date  . Allergic rhinitis, cause unspecified   . Pure hypercholesterolemia   . Esophageal reflux   . Headache(784.0)   . Generalized anxiety disorder   . Unspecified vitamin D deficiency   . Rash and other nonspecific skin eruption   . Anemia, unspecified    Past Surgical History  Procedure Laterality Date  . Tendon repair  1990    left index finger   Family History  Problem Relation Age of Onset  . Heart disease Maternal Grandfather   . Asthma Mother   . Diabetes Mother   . Arthritis Mother   . Asthma Sister   . Diabetes Sister   . Allergies Sister    History  Substance Use Topics  . Smoking status: Never Smoker   . Smokeless tobacco: Not on file  . Alcohol Use: Yes     Comment: occasionally   OB History   Grav Para Term Preterm Abortions TAB SAB Ect Mult Living                 Review of Systems  HENT: Positive for congestion, ear pain, postnasal drip, sinus pressure and sore throat.   Respiratory: Positive for cough.   All other systems reviewed  and are negative.     Allergies  Codeine phosphate  Home Medications   Prior to Admission medications   Medication Sig Start Date End Date Taking? Authorizing Provider  ALPRAZolam Duanne Moron) 0.5 MG tablet Take 1 tablet (0.5 mg total) by mouth 3 (three) times daily as needed for anxiety. 03/22/13   Noralee Space, MD  amLODipine (NORVASC) 5 MG tablet Take 1 tablet (5 mg total) by mouth daily. 03/08/13   Noralee Space, MD  Cholecalciferol (VITAMIN D3) 5000 UNITS CAPS Take 1 capsule by mouth daily.    Historical Provider, MD  dicyclomine (BENTYL) 10 MG capsule Take 1 capsule (10 mg total) by mouth every 6 (six) hours as needed for spasms. 03/08/13   Noralee Space, MD  Diphenhyd-Hydrocort-Nystatin (FIRST-DUKES MOUTHWASH) SUSP Gargle and swallow 1 tsp QID PRN 01/05/13   Noralee Space, MD  loperamide (IMODIUM) 2 MG capsule Take 1 capsule (2 mg total) by mouth every 6 (six) hours as needed for diarrhea or loose stools. 03/08/13   Noralee Space, MD  meloxicam (MOBIC) 7.5 MG tablet Take 1 tablet (7.5 mg total) by mouth daily. 03/08/13   Noralee Space, MD  Multiple Vitamins-Minerals (WOMENS MULTIVITAMIN PLUS) TABS Take 1 tablet by mouth daily.      Historical Provider, MD  omeprazole (Freeman)  20 MG capsule take 1 capsule by mouth once daily 07/12/12   Noralee Space, MD  pravastatin (PRAVACHOL) 40 MG tablet take 1 tablet by mouth once daily 03/08/13   Noralee Space, MD  sertraline (ZOLOFT) 100 MG tablet Take 1 tablet (100 mg total) by mouth daily. 03/08/13   Noralee Space, MD  traMADol (ULTRAM) 50 MG tablet Take 1 tablet (50 mg total) by mouth 3 (three) times daily as needed. 03/12/13   Noralee Space, MD   BP 138/79  Pulse 70  Temp(Src) 98.7 F (37.1 C) (Oral)  Resp 18  Wt 145 lb (65.772 kg)  SpO2 100%  LMP 06/15/2013  Physical Exam  Nursing note and vitals reviewed. Constitutional: She is oriented to person, place, and time. She appears well-developed and well-nourished. No distress.  HENT:  Head:  Normocephalic and atraumatic.  Right Ear: Tympanic membrane and ear canal normal.  Left Ear: Tympanic membrane and ear canal normal.  Nose: Mucosal edema present. Right sinus exhibits maxillary sinus tenderness. Left sinus exhibits maxillary sinus tenderness.  Mouth/Throat: Uvula is midline, oropharynx is clear and moist and mucous membranes are normal. No oropharyngeal exudate, posterior oropharyngeal edema, posterior oropharyngeal erythema or tonsillar abscesses.  Nasal congestion and PND noted; tonsils normal in appearance without exudate; uvula midline; no peritonsillar abscess; handling secretions appropriately; no difficulty swallowing or speaking  Eyes: Conjunctivae and EOM are normal. Pupils are equal, round, and reactive to light.  Neck: Normal range of motion.  Cardiovascular: Normal rate, regular rhythm and normal heart sounds.   Pulmonary/Chest: Effort normal and breath sounds normal. She has no decreased breath sounds. She has no wheezes. She has no rhonchi.  Abdominal: Soft. Bowel sounds are normal.  Musculoskeletal: Normal range of motion.  Neurological: She is alert and oriented to person, place, and time.  Skin: Skin is warm and dry. She is not diaphoretic.  Psychiatric: She has a normal mood and affect.    ED Course  Procedures (including critical care time) Labs Review Labs Reviewed - No data to display  Imaging Review No results found.   EKG Interpretation None      MDM   Final diagnoses:  Sinusitis   Exam findings consistent with sinusitis.  Lungs clear without any signs of respiratory distress.  Due to prolonged sx, pt started on amoxicillin, may continue nasonex. FU with PCP. Discussed plan with patient, he/she acknowledged understanding and agreed with plan of care.  Return precautions given for new or worsening symptoms.  Larene Pickett, PA-C 07/22/13 1343

## 2013-07-22 NOTE — ED Provider Notes (Signed)
Medical screening examination/treatment/procedure(s) were performed by non-physician practitioner and as supervising physician I was immediately available for consultation/collaboration.   EKG Interpretation None       Orpah Greek, MD 07/22/13 1344

## 2013-07-22 NOTE — Discharge Instructions (Signed)
Take the prescribed medication as directed. °Follow-up with your primary care physician. °Return to the ED for new or worsening symptoms. ° °

## 2013-10-14 ENCOUNTER — Telehealth: Payer: Self-pay | Admitting: Pulmonary Disease

## 2013-10-14 NOTE — Telephone Encounter (Signed)
Please advise if okay to refill alpraz  Pt last seen by SN 03/08/13  No appt pending  Last written for # 90 with 5 rf on 03/22/13 Thanks!

## 2013-10-14 NOTE — Telephone Encounter (Signed)
[email or web address].Kirstan Fentress, CMA  

## 2013-10-14 NOTE — Telephone Encounter (Signed)
Per SN--  Sorry SN is not able to fill this medication for her.  She has cancelled numerous appts with new PCP.  She will have to set up with a new PCP to have these meds filled for her.

## 2013-10-15 NOTE — Telephone Encounter (Signed)
Called and spoke with pt and she stated that she has not set up with new PCP due to no insurance.  She stated that she starts a new job on Tuesday and would like to come in to see SN one last time.  appt scheduled.

## 2013-10-15 NOTE — Telephone Encounter (Signed)
Pt returned call & asked to be reached at [phone].  Satira Anis

## 2013-10-15 NOTE — Telephone Encounter (Signed)
lmomtcb x1 

## 2013-10-21 ENCOUNTER — Encounter: Payer: Self-pay | Admitting: Pulmonary Disease

## 2013-10-21 ENCOUNTER — Ambulatory Visit (INDEPENDENT_AMBULATORY_CARE_PROVIDER_SITE_OTHER): Payer: Self-pay | Admitting: Pulmonary Disease

## 2013-10-21 VITALS — BP 140/90 | HR 75 | Temp 97.6°F | Ht 60.0 in | Wt 148.0 lb

## 2013-10-21 DIAGNOSIS — E7800 Pure hypercholesterolemia, unspecified: Secondary | ICD-10-CM

## 2013-10-21 DIAGNOSIS — M150 Primary generalized (osteo)arthritis: Secondary | ICD-10-CM

## 2013-10-21 DIAGNOSIS — I10 Essential (primary) hypertension: Secondary | ICD-10-CM

## 2013-10-21 DIAGNOSIS — M159 Polyosteoarthritis, unspecified: Secondary | ICD-10-CM

## 2013-10-21 DIAGNOSIS — F411 Generalized anxiety disorder: Secondary | ICD-10-CM

## 2013-10-21 DIAGNOSIS — E559 Vitamin D deficiency, unspecified: Secondary | ICD-10-CM

## 2013-10-21 DIAGNOSIS — Z23 Encounter for immunization: Secondary | ICD-10-CM

## 2013-10-21 DIAGNOSIS — K219 Gastro-esophageal reflux disease without esophagitis: Secondary | ICD-10-CM

## 2013-10-21 MED ORDER — PRAVASTATIN SODIUM 40 MG PO TABS: ORAL_TABLET | ORAL | Status: DC

## 2013-10-21 MED ORDER — ALPRAZOLAM 0.5 MG PO TABS: 0.5000 mg | ORAL_TABLET | Freq: Three times a day (TID) | ORAL | Status: DC | PRN

## 2013-10-21 MED ORDER — SERTRALINE HCL 100 MG PO TABS: 100.0000 mg | ORAL_TABLET | Freq: Every day | ORAL | Status: DC

## 2013-10-21 MED ORDER — AMLODIPINE BESYLATE 5 MG PO TABS: 5.0000 mg | ORAL_TABLET | Freq: Every day | ORAL | Status: DC

## 2013-10-21 MED ORDER — TRAMADOL HCL 50 MG PO TABS: 50.0000 mg | ORAL_TABLET | Freq: Three times a day (TID) | ORAL | Status: DC | PRN
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
## 2013-10-21 NOTE — Patient Instructions (Addendum)
Today we updated your med list in our EPIC system...    Continue your current medications the same...  We refilled your meds per request...  Keep up the good work w/ diet & exercise...  We gave you the 2015 Flu vaccine today...   Call for any questions.Marland KitchenMarland Kitchen

## 2014-01-26 ENCOUNTER — Emergency Department (HOSPITAL_COMMUNITY)
Admission: EM | Admit: 2014-01-26 | Discharge: 2014-01-26 | Disposition: A | Discharge status: Home / Self Care | Payer: Self-pay | Attending: Emergency Medicine | Admitting: Emergency Medicine

## 2014-01-26 ENCOUNTER — Encounter (HOSPITAL_COMMUNITY): Payer: Self-pay | Admitting: Emergency Medicine

## 2014-01-26 ENCOUNTER — Telehealth: Payer: Self-pay | Admitting: Pulmonary Disease

## 2014-01-26 DIAGNOSIS — Z8709 Personal history of other diseases of the respiratory system: Secondary | ICD-10-CM | POA: Insufficient documentation

## 2014-01-26 DIAGNOSIS — Z8719 Personal history of other diseases of the digestive system: Secondary | ICD-10-CM | POA: Insufficient documentation

## 2014-01-26 DIAGNOSIS — R42 Dizziness and giddiness: Secondary | ICD-10-CM

## 2014-01-26 DIAGNOSIS — E559 Vitamin D deficiency, unspecified: Secondary | ICD-10-CM | POA: Insufficient documentation

## 2014-01-26 DIAGNOSIS — D649 Anemia, unspecified: Secondary | ICD-10-CM | POA: Insufficient documentation

## 2014-01-26 DIAGNOSIS — Z79899 Other long term (current) drug therapy: Secondary | ICD-10-CM | POA: Insufficient documentation

## 2014-01-26 DIAGNOSIS — F411 Generalized anxiety disorder: Secondary | ICD-10-CM | POA: Insufficient documentation

## 2014-01-26 DIAGNOSIS — E780 Pure hypercholesterolemia: Secondary | ICD-10-CM | POA: Insufficient documentation

## 2014-01-26 DIAGNOSIS — I10 Essential (primary) hypertension: Secondary | ICD-10-CM | POA: Insufficient documentation

## 2014-01-26 LAB — CBC
HEMATOCRIT: 36.6 % (ref 36.0–46.0)
Hemoglobin: 11.8 g/dL — ABNORMAL LOW (ref 12.0–15.0)
MCH: 26.5 pg (ref 26.0–34.0)
MCHC: 32.2 g/dL (ref 30.0–36.0)
MCV: 82.1 fL (ref 78.0–100.0)
Platelets: 390 10*3/uL (ref 150–400)
RBC: 4.46 MIL/uL (ref 3.87–5.11)
RDW: 14 % (ref 11.5–15.5)
WBC: 7.1 10*3/uL (ref 4.0–10.5)

## 2014-01-26 LAB — BASIC METABOLIC PANEL
Anion gap: 13 (ref 5–15)
BUN: 10 mg/dL (ref 6–23)
CALCIUM: 9.2 mg/dL (ref 8.4–10.5)
CHLORIDE: 99 meq/L (ref 96–112)
CO2: 23 mEq/L (ref 19–32)
CREATININE: 0.52 mg/dL (ref 0.50–1.10)
GFR calc non Af Amer: >90 mL/min (ref >90)
Glucose, Bld: 89 mg/dL (ref 70–99)
Potassium: 3.9 mEq/L (ref 3.7–5.3)
Sodium: 135 mEq/L — ABNORMAL LOW (ref 137–147)

## 2014-01-26 MED ORDER — MECLIZINE HCL 50 MG PO TABS: 50.0000 mg | ORAL_TABLET | Freq: Three times a day (TID) | ORAL | Status: DC | PRN

## 2014-01-26 NOTE — Telephone Encounter (Signed)
Spoke with pt, states she's been working long hours at work and is getting dizzy and wobbly at work, worse in mornings X2 weeks.  Her BP this morning was 172/98 this morning, is normally in the low 150's/low 80's.  She takes amlodipine for hypertension, does not take anything for vertigo.  Spoke with SN, he recommended her to go to the E.D. For this problem.  She is aware of recs.  Nothing further needed at this time.

## 2014-01-26 NOTE — ED Provider Notes (Signed)
CSN: [id number]     Arrival date & time 01/26/14  1203 History   First MD Initiated Contact with Patient 01/26/14 1236     Chief Complaint  Patient presents with  . Dizziness    Hx of vertigo     (Consider location/radiation/quality/duration/timing/severity/associated sxs/prior Treatment) HPI Comments: Brenda [name] is a 48 y.o. female with a PMHx of allergic rhinitis, HLD, GERD, chronic headaches, GAD, anemia, and vertigo, who presents to the ED with complaints of 2 wks of ongoing lightheadedness worse with head movement and standing up. Pt states she's been stressed at work and working long hours on an [employer], and that as she's been sleeping less/working more, she has developed lightheadedness. She had vertigo in 2007 which felt similar to this. She has used dramamine with relief of symptoms but this causes drowsiness and she can't perform her work duties when taking that medication. She denies fevers, chills, tinnitus, ear pain/drainage, hearing loss, LOC, syncope, HA, vision changes, CP, SOB, palpitations, URI symptoms, abd pain, n/v/d/c, hematochezia, melena, hematuria, dysuria, vaginal bleeding/discharge, numbness, weakness, paresthesias, focal neuro deficits, ataxic gait, or sensation of spinning. Takes amlodipine regularly for BP control, complaint on all medications.   Patient is a 48 y.o. female presenting with dizziness. The history is provided by the patient. No language interpreter was used.  Dizziness Quality:  Lightheadedness Severity:  Mild Onset quality:  Gradual Duration:  2 weeks Timing:  Constant Progression:  Unchanged Chronicity:  Recurrent Context: head movement and standing up   Relieved by:  Medication (dramamine) Worsened by:  Turning head and standing up Ineffective treatments:  None tried Associated symptoms: no blood in stool, no chest pain, no diarrhea, no headaches, no hearing loss, no nausea, no palpitations, no shortness of breath, no syncope, no  tinnitus, no vision changes, no vomiting and no weakness   Risk factors: hx of vertigo     Past Medical History  Diagnosis Date  . Allergic rhinitis, cause unspecified   . Pure hypercholesterolemia   . Esophageal reflux   . Headache(784.0)   . Generalized anxiety disorder   . Unspecified vitamin D deficiency   . Rash and other nonspecific skin eruption   . Anemia, unspecified    Past Surgical History  Procedure Laterality Date  . Tendon repair  1990    left index finger   Family History  Problem Relation Age of Onset  . Heart disease Maternal Grandfather   . Asthma Mother   . Diabetes Mother   . Arthritis Mother   . Asthma Sister   . Diabetes Sister   . Allergies Sister    History  Substance Use Topics  . Smoking status: Never Smoker   . Smokeless tobacco: Not on file  . Alcohol Use: Yes     Comment: occasionally   OB History    No data available     Review of Systems  Constitutional: Negative for fever, chills and diaphoresis.  HENT: Negative for ear discharge, ear pain, hearing loss, rhinorrhea, sinus pressure and tinnitus.   Eyes: Negative for photophobia and visual disturbance.  Respiratory: Negative for shortness of breath.   Cardiovascular: Negative for chest pain, palpitations and syncope.  Gastrointestinal: Negative for nausea, vomiting, abdominal pain, diarrhea and blood in stool.  Genitourinary: Negative for dysuria, hematuria, vaginal bleeding and vaginal discharge.  Musculoskeletal: Negative for myalgias, back pain, arthralgias, gait problem and neck pain.  Skin: Negative for color change.  Neurological: Positive for dizziness and light-headedness. Negative for syncope,  weakness, numbness and headaches.  Hematological: Does not bruise/bleed easily.  Psychiatric/Behavioral: Negative for confusion.   10 Systems reviewed and are negative for acute change except as noted in the HPI.    Allergies  Codeine phosphate  Home Medications   Prior to  Admission medications   Medication Sig Start Date End Date Taking? Authorizing Provider  ALPRAZolam Duanne Moron) 0.5 MG tablet Take 1 tablet (0.5 mg total) by mouth 3 (three) times daily as needed for anxiety. 10/21/13   Noralee Space, MD  amLODipine (NORVASC) 5 MG tablet Take 1 tablet (5 mg total) by mouth daily. 10/21/13   Noralee Space, MD  Cholecalciferol (VITAMIN D3) 5000 UNITS CAPS Take 1 capsule by mouth daily.    Historical Provider, MD  Multiple Vitamins-Minerals (WOMENS MULTIVITAMIN PLUS) TABS Take 1 tablet by mouth daily.      Historical Provider, MD  pravastatin (PRAVACHOL) 40 MG tablet take 1 tablet by mouth once daily 10/21/13   Noralee Space, MD  sertraline (ZOLOFT) 100 MG tablet Take 1 tablet (100 mg total) by mouth daily. 10/21/13   Noralee Space, MD  traMADol (ULTRAM) 50 MG tablet Take 1 tablet (50 mg total) by mouth 3 (three) times daily as needed. 10/21/13   Noralee Space, MD   BP 155/86 mmHg  Pulse 62  Temp(Src) 97.5 F (36.4 C) (Oral)  Resp 16  Wt 148 lb (67.132 kg)  SpO2 99%  LMP 11/26/2013 Physical Exam  Constitutional: She is oriented to person, place, and time. Vital signs are normal. She appears well-developed and well-nourished.  Non-toxic appearance. No distress.  Afebrile, nontoxic, NAD  HENT:  Head: Normocephalic and atraumatic.  Right Ear: Hearing, tympanic membrane, external ear and ear canal normal.  Left Ear: Hearing, tympanic membrane, external ear and ear canal normal.  Nose: Nose normal.  Mouth/Throat: Uvula is midline, oropharynx is clear and moist and mucous membranes are normal. No trismus in the jaw.  Maiden/AT Ears clear bilaterally Nose clear Oropharynx clear and moist with no uvular deviation   Eyes: Conjunctivae and EOM are normal. Pupils are equal, round, and reactive to light. Right eye exhibits no discharge. Left eye exhibits no discharge.  PERRL, EOMI  Neck: Normal range of motion. Neck supple. No spinous process tenderness and no muscular  tenderness present. No rigidity. Normal range of motion present.  FROM intact without spinous process or paraspinous muscle TTP, no bony stepoffs or deformities, no muscle spasms. No rigidity or meningeal signs. No bruising or swelling.   Cardiovascular: Normal rate, regular rhythm, normal heart sounds and intact distal pulses.  Exam reveals no gallop and no friction rub.   No murmur heard. RRR, nl s1/s2, no m/r/g, distal pulses intact  Pulmonary/Chest: Effort normal and breath sounds normal. No respiratory distress. She has no decreased breath sounds. She has no wheezes. She has no rhonchi. She has no rales.  Abdominal: Soft. Normal appearance and bowel sounds are normal. She exhibits no distension. There is no tenderness. There is no rigidity, no rebound, no guarding, no tenderness at McBurney's point and negative Murphy's sign.  Musculoskeletal: Normal range of motion.  MAE x4 Strength 5/5 in all extremities Sensation grossly intact in all extremities All spinal levels nonTTP with no deformity, FROM intact Gait steady  Neurological: She is alert and oriented to person, place, and time. She has normal strength and normal reflexes. No cranial nerve deficit or sensory deficit. She displays a negative Romberg sign. Coordination and gait normal.  CN  2-12 grossly intact A&O x4 GCS 15 Sensation and strength intact Gait nonataxic including with tandem walking Coordination with finger-to-nose WNL Neg romberg, neg pronator drift  DTRs intact bilaterally  Skin: Skin is warm, dry and intact. No rash noted.  Psychiatric: She has a normal mood and affect.  Nursing note and vitals reviewed.   ED Course  Procedures (including critical care time) Orthostatic Lying  - BP- Lying: 169/92 mmHg ; Pulse- Lying: 63  Orthostatic Sitting - BP- Sitting: 185/96 mmHg ; Pulse- Sitting: 68  Orthostatic Standing at 0 minutes - BP- Standing at 0 minutes: 178/92 mmHg ; Pulse- Standing at 0 minutes: 67  Labs  Review Labs Reviewed  CBC - Abnormal; Notable for the following:    Hemoglobin 11.8 (*)    All other components within normal limits  BASIC METABOLIC PANEL - Abnormal; Notable for the following:    Sodium 135 (*)    All other components within normal limits    Imaging Review No results found.   EKG Interpretation None    EKG: NSR  MDM   Final diagnoses:  Dizziness  HTN (hypertension), benign  Anemia, unspecified anemia type    48 y.o. female with dizziness/lightheadedness recently as her job has become stressful and she's been working long hours. HTN at baseline, orthostatics neg. Will obtain labs and EKG and reassess. Pt is driving home today therefore declined trying meclizine. Will likely rx this for home use. Symptoms likely related to stress/lack of sleep. Neuro exam benign without focal deficits, doubt need for emergent imaging.   1:54 PM CBC showing baseline anemia. BMP showing mildly low sodium at 135. EKG NSR with no abnormalities. Discussed good oral hydration and trial of meclizine. Discussed good sleep patterns and getting plenty of rest. Monitor BP and take home BP meds. See PCP in 1wk for recheck. I explained the diagnosis and have given explicit precautions to return to the ER including for any other new or worsening symptoms. The patient understands and accepts the medical plan as it's been dictated and I have answered their questions. Discharge instructions concerning home care and prescriptions have been given. The patient is STABLE and is discharged to home in good condition.  BP 152/78 mmHg  Pulse 55  Temp(Src) 97.5 F (36.4 C) (Oral)  Resp 16  Wt 148 lb (67.132 kg)  SpO2 100%  LMP 11/26/2013  Meds ordered this encounter  Medications  . meclizine (ANTIVERT) 50 MG tablet    Sig: Take 1 tablet (50 mg total) by mouth 3 (three) times daily as needed for dizziness.    Dispense:  30 tablet    Refill:  0    Order Specific Question:  Supervising Provider     Answer:  Noemi Chapel D [[id number]]       Patty Sermons Camprubi-Soms, PA-C 01/26/14 Forada Alvino Chapel, MD 01/27/14 [phone]

## 2014-01-26 NOTE — ED Notes (Addendum)
Pt reports working long hours at work resulting in dizziness for two weeks. Pt denies SOB or CP. Pt took dramamine with some relief. Pt reports hx of vertigo.

## 2014-01-26 NOTE — ED Notes (Signed)
Pt alert, arrives from home, c/o dizziness, onset chronic in nature, admits to hx of vertigo, pt drove self to ER, ambulates to triage, states she has been working  long hours lately, resp even unlabored, skin pwd

## 2014-01-26 NOTE — Discharge Instructions (Signed)
Use antivert for your dizziness. Stay well hydrated, monitor your blood pressure, and take your regular home medications. See your regular doctor in 1 week. Return to the ER for changes or worsening symptoms.   Dizziness Dizziness is a common problem. It is a feeling of unsteadiness or light-headedness. You may feel like you are about to faint. Dizziness can lead to injury if you stumble or fall. A person of any age group can suffer from dizziness, but dizziness is more common in older adults. CAUSES  Dizziness can be caused by many different things, including:  Middle ear problems.  Standing for too long.  Infections.  An allergic reaction.  Aging.  An emotional response to something, such as the sight of blood.  Side effects of medicines.  Tiredness.  Problems with circulation or blood pressure.  Excessive use of alcohol or medicines, or illegal drug use.  Breathing too fast (hyperventilation).  An irregular heart rhythm (arrhythmia).  A low red blood cell count (anemia).  Pregnancy.  Vomiting, diarrhea, fever, or other illnesses that cause body fluid loss (dehydration).  Diseases or conditions such as Parkinson's disease, high blood pressure (hypertension), diabetes, and thyroid problems.  Exposure to extreme heat. DIAGNOSIS  Your health care provider will ask about your symptoms, perform a physical exam, and perform an electrocardiogram (ECG) to record the electrical activity of your heart. Your health care provider may also perform other heart or blood tests to determine the cause of your dizziness. These may include:  Transthoracic echocardiogram (TTE). During echocardiography, sound waves are used to evaluate how blood flows through your heart.  Transesophageal echocardiogram (TEE).  Cardiac monitoring. This allows your health care provider to monitor your heart rate and rhythm in real time.  Holter monitor. This is a portable device that records your  heartbeat and can help diagnose heart arrhythmias. It allows your health care provider to track your heart activity for several days if needed.  Stress tests by exercise or by giving medicine that makes the heart beat faster. TREATMENT  Treatment of dizziness depends on the cause of your symptoms and can vary greatly. HOME CARE INSTRUCTIONS   Drink enough fluids to keep your urine clear or pale yellow. This is especially important in very hot weather. In older adults, it is also important in cold weather.  Take your medicine exactly as directed if your dizziness is caused by medicines. When taking blood pressure medicines, it is especially important to get up slowly.  Rise slowly from chairs and steady yourself until you feel okay.  In the morning, first sit up on the side of the bed. When you feel okay, stand slowly while holding onto something until you know your balance is fine.  Move your legs often if you need to stand in one place for a long time. Tighten and relax your muscles in your legs while standing.  Have someone stay with you for 1-2 days if dizziness continues to be a problem. Do this until you feel you are well enough to stay alone. Have the person call your health care provider if he or she notices changes in you that are concerning.  Do not drive or use heavy machinery if you feel dizzy.  Do not drink alcohol. SEEK IMMEDIATE MEDICAL CARE IF:   Your dizziness or light-headedness gets worse.  You feel nauseous or vomit.  You have problems talking, walking, or using your arms, hands, or legs.  You feel weak.  You are not thinking clearly  or you have trouble forming sentences. It may take a friend or family member to notice this.  You have chest pain, abdominal pain, shortness of breath, or sweating.  Your vision changes.  You notice any bleeding.  You have side effects from medicine that seems to be getting worse rather than better. MAKE SURE YOU:    Understand these instructions.  Will watch your condition.  Will get help right away if you are not doing well or get worse. Document Released: 07/24/2000 Document Revised: 02/02/2013 Document Reviewed: 08/17/2010 [facility] Patient Information 2015 [place], [place]. This information is not intended to replace advice given to you by your health care provider. Make sure you discuss any questions you have with your health care provider.  Vertigo Vertigo means you feel like you are moving when you are not. Vertigo can make you feel like things around you are moving when they are not. This problem often goes away on its own.  HOME CARE   Follow your doctor's instructions.  Avoid driving.  Avoid using heavy machinery.  Avoid doing any activity that could be dangerous if you have a vertigo attack.  Tell your doctor if a medicine seems to cause your vertigo. GET HELP RIGHT AWAY IF:   Your medicines do not help or make you feel worse.  You have trouble talking or walking.  You feel weak or have trouble using your arms, hands, or legs.  You have bad headaches.  You keep feeling sick to your stomach (nauseous) or throwing up (vomiting).  Your vision changes.  A family member notices changes in your behavior.  Your problems get worse. MAKE SURE YOU:  Understand these instructions.  Will watch your condition.  Will get help right away if you are not doing well or get worse. Document Released: 11/07/2007 Document Revised: 04/22/2011 Document Reviewed: 08/16/2010 [facility] Patient Information 2015 [place], [place]. This information is not intended to replace advice given to you by your health care provider. Make sure you discuss any questions you have with your health care provider.  Hypertension Hypertension, commonly called high blood pressure, is when the force of blood pumping through your arteries is too strong. Your arteries are the blood vessels that carry blood from your  heart throughout your body. A blood pressure reading consists of a higher number over a lower number, such as 110/72. The higher number (systolic) is the pressure inside your arteries when your heart pumps. The lower number (diastolic) is the pressure inside your arteries when your heart relaxes. Ideally you want your blood pressure below 120/80. Hypertension forces your heart to work harder to pump blood. Your arteries may become narrow or stiff. Having hypertension puts you at risk for heart disease, stroke, and other problems.  RISK FACTORS Some risk factors for high blood pressure are controllable. Others are not.  Risk factors you cannot control include:   Race. You may be at higher risk if you are African American.  Age. Risk increases with age.  Gender. Men are at higher risk than women before age 35 years. After age 76, women are at higher risk than men. Risk factors you can control include:  Not getting enough exercise or physical activity.  Being overweight.  Getting too much fat, sugar, calories, or salt in your diet.  Drinking too much alcohol. SIGNS AND SYMPTOMS Hypertension does not usually cause signs or symptoms. Extremely high blood pressure (hypertensive crisis) may cause headache, anxiety, shortness of breath, and nosebleed. DIAGNOSIS  To check  if you have hypertension, your health care provider will measure your blood pressure while you are seated, with your arm held at the level of your heart. It should be measured at least twice using the same arm. Certain conditions can cause a difference in blood pressure between your right and left arms. A blood pressure reading that is higher than normal on one occasion does not mean that you need treatment. If one blood pressure reading is high, ask your health care provider about having it checked again. TREATMENT  Treating high blood pressure includes making lifestyle changes and possibly taking medicine. Living a healthy  lifestyle can help lower high blood pressure. You may need to change some of your habits. Lifestyle changes may include:  Following the DASH diet. This diet is high in fruits, vegetables, and whole grains. It is low in salt, red meat, and added sugars.  Getting at least 2 hours of brisk physical activity every week.  Losing weight if necessary.  Not smoking.  Limiting alcoholic beverages.  Learning ways to reduce stress. If lifestyle changes are not enough to get your blood pressure under control, your health care provider may prescribe medicine. You may need to take more than one. Work closely with your health care provider to understand the risks and benefits. HOME CARE INSTRUCTIONS  Have your blood pressure rechecked as directed by your health care provider.   Take medicines only as directed by your health care provider. Follow the directions carefully. Blood pressure medicines must be taken as prescribed. The medicine does not work as well when you skip doses. Skipping doses also puts you at risk for problems.   Do not smoke.   Monitor your blood pressure at home as directed by your health care provider. SEEK MEDICAL CARE IF:   You think you are having a reaction to medicines taken.  You have recurrent headaches or feel dizzy.  You have swelling in your ankles.  You have trouble with your vision. SEEK IMMEDIATE MEDICAL CARE IF:  You develop a severe headache or confusion.  You have unusual weakness, numbness, or feel faint.  You have severe chest or abdominal pain.  You vomit repeatedly.  You have trouble breathing. MAKE SURE YOU:   Understand these instructions.  Will watch your condition.  Will get help right away if you are not doing well or get worse. Document Released: 01/28/2005 Document Revised: 06/14/2013 Document Reviewed: 11/20/2012 [facility] Patient Information 2015 [place], [place]. This information is not intended to replace advice given to you  by your health care provider. Make sure you discuss any questions you have with your health care provider.  How to Take Your Blood Pressure HOW DO I GET A BLOOD PRESSURE MACHINE?  You can buy an electronic home blood pressure machine at your local pharmacy. Insurance will sometimes cover the cost if you have a prescription.  Ask your doctor what type of machine is best for you. There are different machines for your arm and your wrist.  If you decide to buy a machine to check your blood pressure on your arm, first check the size of your arm so you can buy the right size cuff. To check the size of your arm:   Use a measuring tape that shows both inches and centimeters.   Wrap the measuring tape around the upper-middle part of your arm. You may need someone to help you measure.   Write down your arm measurement in both inches and centimeters.  To measure your blood pressure correctly, it is important to have the right size cuff.   If your arm is up to 13 inches (up to 34 centimeters), get an adult cuff size.  If your arm is 13 to 17 inches (35 to 44 centimeters), get a large adult cuff size.    If your arm is 17 to 20 inches (45 to 52 centimeters), get an adult thigh cuff.  WHAT DO THE NUMBERS MEAN?   There are two numbers that make up your blood pressure. For example: 120/80.  The first number (120 in our example) is called the "systolic pressure." It is a measure of the pressure in your blood vessels when your heart is pumping blood.  The second number (80 in our example) is called the "diastolic pressure." It is a measure of the pressure in your blood vessels when your heart is resting between beats.  Your doctor will tell you what your blood pressure should be. WHAT SHOULD I DO BEFORE I CHECK MY BLOOD PRESSURE?   Try to rest or relax for at least 30 minutes before you check your blood pressure.  Do not smoke.  Do not have any drinks with caffeine, such  as:  Soda.  Coffee.  Tea.  Check your blood pressure in a quiet room.  Sit down and stretch out your arm on a table. Keep your arm at about the level of your heart. Let your arm relax.  Make sure that your legs are not crossed. HOW DO I CHECK MY BLOOD PRESSURE?  Follow the directions that came with your machine.  Make sure you remove any tight-fighting clothing from your arm or wrist. Wrap the cuff around your upper arm or wrist. You should be able to fit a finger between the cuff and your arm. If you cannot fit a finger between the cuff and your arm, it is too tight and should be removed and rewrapped.  Some units require you to manually pump up the arm cuff.  Automatic units inflate the cuff when you press a button.  Cuff deflation is automatic in both models.  After the cuff is inflated, the unit measures your blood pressure and pulse. The readings are shown on a monitor. Hold still and breathe normally while the cuff is inflated.  Getting a reading takes less than a minute.  Some models store readings in a memory. Some provide a printout of readings. If your machine does not store your readings, keep a written record.  Take readings with you to your next visit with your doctor. Document Released: 01/11/2008 Document Revised: 06/14/2013 Document Reviewed: 03/25/2013 [facility] Patient Information 2015 [place], [place]. This information is not intended to replace advice given to you by your health care provider. Make sure you discuss any questions you have with your health care provider.

## 2014-02-12 ENCOUNTER — Telehealth: Payer: Self-pay | Admitting: Pulmonary Disease

## 2014-02-12 MED ORDER — SERTRALINE HCL 100 MG PO TABS: 100.0000 mg | ORAL_TABLET | Freq: Every day | ORAL | Status: DC

## 2014-02-12 NOTE — Telephone Encounter (Signed)
Pts pharmacy is closed today, and she is out of her sertraline and alprazolam.  She is changing pharmacies to [facility] [address], and needs a few days called in until prescriptions can be transferred over.  Will do this for a few days.

## 2014-03-14 ENCOUNTER — Inpatient Hospital Stay: Payer: Self-pay | Admitting: Pulmonary Disease

## 2014-03-30 ENCOUNTER — Ambulatory Visit: Payer: Self-pay | Admitting: Family

## 2014-04-04 ENCOUNTER — Telehealth: Payer: Self-pay | Admitting: Pulmonary Disease

## 2014-04-04 NOTE — Telephone Encounter (Signed)
You can schedule. Per SN's last note he wanted her to follow up in 04/2014.

## 2014-04-04 NOTE — Telephone Encounter (Signed)
Made pt an appt.  Nothing further needed at this time.

## 2014-04-04 NOTE — Telephone Encounter (Signed)
pt sent a message to front office for an appt to see SN.  please call pt, or let us know if it is ok to schedule.

## 2014-04-14 ENCOUNTER — Ambulatory Visit: Payer: Self-pay | Admitting: Pulmonary Disease

## 2014-04-20 ENCOUNTER — Ambulatory Visit: Payer: Self-pay | Admitting: Pulmonary Disease

## 2014-04-26 ENCOUNTER — Ambulatory Visit: Payer: Self-pay | Admitting: Pulmonary Disease

## 2014-05-15 ENCOUNTER — Emergency Department (HOSPITAL_BASED_OUTPATIENT_CLINIC_OR_DEPARTMENT_OTHER)
Admission: EM | Admit: 2014-05-15 | Discharge: 2014-05-15 | Disposition: A | Discharge status: Home / Self Care | Payer: Self-pay | Attending: Emergency Medicine | Admitting: Emergency Medicine

## 2014-05-15 DIAGNOSIS — Z9114 Patient's other noncompliance with medication regimen: Secondary | ICD-10-CM | POA: Insufficient documentation

## 2014-05-15 DIAGNOSIS — Z8719 Personal history of other diseases of the digestive system: Secondary | ICD-10-CM | POA: Insufficient documentation

## 2014-05-15 DIAGNOSIS — Z862 Personal history of diseases of the blood and blood-forming organs and certain disorders involving the immune mechanism: Secondary | ICD-10-CM | POA: Insufficient documentation

## 2014-05-15 DIAGNOSIS — Z79899 Other long term (current) drug therapy: Secondary | ICD-10-CM | POA: Insufficient documentation

## 2014-05-15 DIAGNOSIS — Z8709 Personal history of other diseases of the respiratory system: Secondary | ICD-10-CM | POA: Insufficient documentation

## 2014-05-15 DIAGNOSIS — F419 Anxiety disorder, unspecified: Secondary | ICD-10-CM | POA: Insufficient documentation

## 2014-05-15 DIAGNOSIS — Z8639 Personal history of other endocrine, nutritional and metabolic disease: Secondary | ICD-10-CM | POA: Insufficient documentation

## 2014-05-15 MED ORDER — AMLODIPINE BESYLATE 5 MG PO TABS
5.0000 mg | ORAL_TABLET | Freq: Once | ORAL | Status: AC
  Administered 2014-05-15: 5 mg via ORAL
  Filled 2014-05-15: qty 1

## 2014-05-15 NOTE — ED Notes (Signed)
Patient expressing concern that she cannot afford her medications - contacted [facility] to see what resources were available. Spoke to [employer] who states [place] can only offer assistance to inpatients. Pharmacy rep advised that pt f/u with Out Patient Pharmacy and Care Management tomorrow since these departments were closed today. Pt given phone numbers to outpatient pharmacy and case manager. Message left for RN assigned to Charge tomorrow morning.

## 2014-05-15 NOTE — ED Notes (Signed)
Patient here requesting her depression medications. Reports that she has been out of same x 2 weeks. Has recently gone back to work and reports limited resources

## 2014-05-15 NOTE — ED Provider Notes (Signed)
CSN: [id number]     Arrival date & time 05/15/14  1149 History  This chart was scribed for Brenda Dakin, MD by Brenda [name], ED Scribe. This patient was seen in room MHFT1/MHFT1 and the patient's care was started at 3:30 PM.    Chief Complaint  Patient presents with  . Medication Refill   The history is provided by the patient. No language interpreter was used.    HPI Comments: Brenda [name] is a 49 y.o. female who presents to the Emergency Department because she is out of medication for depression; she is prescribed Xanax and Zoloftand amlodipine by Noralee Space, MD but has been out for 2 weeks. She states she still has refills left but is not able to afford it. She states she just started a job and gets a paycheck this week, at which time she will be able to afford themthis week. She states she is "zoning in and out" and "can't think straight" so she needs something now. She denies thoughts of self harm. She also has history of HTN for which she does not have her medication, also prescribed by Noralee Space, MD. She states she is out of all her medications. She denies smoking, drinking, or drug use.  Past Medical History  Diagnosis Date  . Allergic rhinitis, cause unspecified   . Pure hypercholesterolemia   . Esophageal reflux   . Headache(784.0)   . Generalized anxiety disorder   . Unspecified vitamin D deficiency   . Rash and other nonspecific skin eruption   . Anemia, unspecified    Past Surgical History  Procedure Laterality Date  . Tendon repair  1990    left index finger   Family History  Problem Relation Age of Onset  . Heart disease Maternal Grandfather   . Asthma Mother   . Diabetes Mother   . Arthritis Mother   . Asthma Sister   . Diabetes Sister   . Allergies Sister    History  Substance Use Topics  . Smoking status: Never Smoker   . Smokeless tobacco: Not on file  . Alcohol Use: Yes     Comment: occasionally   OB History    No data available     Review  of Systems  Constitutional: Negative.   HENT: Negative.   Respiratory: Negative.   Cardiovascular: Negative.   Gastrointestinal: Negative.   Musculoskeletal: Negative.   Skin: Negative.   Neurological: Negative.   Psychiatric/Behavioral: Positive for decreased concentration. Negative for suicidal ideas.      Allergies  Codeine phosphate  Home Medications   Prior to Admission medications   Medication Sig Start Date End Date Taking? Authorizing Provider  ALPRAZolam Duanne Moron) 0.5 MG tablet Take 1 tablet (0.5 mg total) by mouth 3 (three) times daily as needed for anxiety. 10/21/13   Noralee Space, MD  amLODipine (NORVASC) 5 MG tablet Take 1 tablet (5 mg total) by mouth daily. 10/21/13   Noralee Space, MD  sertraline (ZOLOFT) 100 MG tablet Take 1 tablet (100 mg total) by mouth daily. 02/12/14   Kathee Delton, MD  traMADol (ULTRAM) 50 MG tablet Take 1 tablet (50 mg total) by mouth 3 (three) times daily as needed. 10/21/13   Noralee Space, MD   BP 163/94 mmHg  Pulse 82  Temp(Src) 97.8 F (36.6 C) (Oral)  Resp 18  Ht 5\' 2"  (1.575 m)  Wt 145 lb (65.772 kg)  BMI 26.51 kg/m2  SpO2 100% Physical Exam  Constitutional: She is  oriented to person, place, and time. She appears well-developed and well-nourished.  HENT:  Head: Normocephalic and atraumatic.  Eyes: Conjunctivae are normal. Pupils are equal, round, and reactive to light.  Neck: Neck supple. No tracheal deviation present. No thyromegaly present.  Cardiovascular: Normal rate and regular rhythm.   No murmur heard. Pulmonary/Chest: Effort normal and breath sounds normal.  Abdominal: Soft. Bowel sounds are normal. She exhibits no distension. There is no tenderness.  Musculoskeletal: Normal range of motion. She exhibits no edema or tenderness.  Neurological: She is alert and oriented to person, place, and time. No cranial nerve deficit. Coordination normal.  Gait normal  Skin: Skin is warm and dry. No rash noted.  Psychiatric: She has  a normal mood and affect.  Nursing note and vitals reviewed.   ED Course  Procedures (including critical care time)  DIAGNOSTIC STUDIES: Oxygen Saturation is 100% on room air, normal by my interpretation.    COORDINATION OF CARE: 3:33 PM Discussed treatment plan with patient at beside, the patient agrees with the plan and has no further questions at this time.   Labs Review Labs Reviewed - No data to display  Imaging Review No results found.   EKG Interpretation None     Patient given amlodipine here. MDM  Should first with phone information from social worker at [facility] whom she can call tomorrow. She is not actively withdrawing from benzodiazepines and I feel that she can safely wait for her medications a few days from now she is not able to afford them. She will be able to get them later this week. Diagnosis:medication non compliance Final diagnoses:  None    I personally performed the services described in this documentation, which was scribed in my presence. The recorded information has been reviewed and considered.      Brenda Dakin, MD 05/15/14 1558

## 2014-05-15 NOTE — Discharge Instructions (Signed)
Call the social worker at [facility] tomorrow with the number that you were furnished. They may be able to help you with getting her medications until yo are able to afford them later this week

## 2014-05-16 ENCOUNTER — Encounter: Payer: Self-pay | Admitting: Pulmonary Disease

## 2014-05-16 ENCOUNTER — Encounter (HOSPITAL_COMMUNITY): Payer: Self-pay | Admitting: Emergency Medicine

## 2014-05-16 ENCOUNTER — Emergency Department (HOSPITAL_COMMUNITY)
Admission: EM | Admit: 2014-05-16 | Discharge: 2014-05-16 | Disposition: A | Discharge status: Home / Self Care | Payer: Self-pay | Attending: Emergency Medicine | Admitting: Emergency Medicine

## 2014-05-16 ENCOUNTER — Ambulatory Visit (INDEPENDENT_AMBULATORY_CARE_PROVIDER_SITE_OTHER): Payer: Self-pay | Admitting: Pulmonary Disease

## 2014-05-16 VITALS — BP 124/80 | HR 90 | Temp 97.2°F | Ht 64.0 in | Wt 148.4 lb

## 2014-05-16 DIAGNOSIS — F329 Major depressive disorder, single episode, unspecified: Secondary | ICD-10-CM

## 2014-05-16 DIAGNOSIS — F411 Generalized anxiety disorder: Secondary | ICD-10-CM

## 2014-05-16 DIAGNOSIS — I10 Essential (primary) hypertension: Secondary | ICD-10-CM

## 2014-05-16 DIAGNOSIS — Z8709 Personal history of other diseases of the respiratory system: Secondary | ICD-10-CM | POA: Insufficient documentation

## 2014-05-16 DIAGNOSIS — Z862 Personal history of diseases of the blood and blood-forming organs and certain disorders involving the immune mechanism: Secondary | ICD-10-CM | POA: Insufficient documentation

## 2014-05-16 DIAGNOSIS — K219 Gastro-esophageal reflux disease without esophagitis: Secondary | ICD-10-CM

## 2014-05-16 DIAGNOSIS — M159 Polyosteoarthritis, unspecified: Secondary | ICD-10-CM

## 2014-05-16 DIAGNOSIS — Z8719 Personal history of other diseases of the digestive system: Secondary | ICD-10-CM | POA: Insufficient documentation

## 2014-05-16 DIAGNOSIS — E7800 Pure hypercholesterolemia, unspecified: Secondary | ICD-10-CM

## 2014-05-16 DIAGNOSIS — J069 Acute upper respiratory infection, unspecified: Secondary | ICD-10-CM

## 2014-05-16 DIAGNOSIS — Z760 Encounter for issue of repeat prescription: Secondary | ICD-10-CM

## 2014-05-16 DIAGNOSIS — F32A Depression, unspecified: Secondary | ICD-10-CM

## 2014-05-16 DIAGNOSIS — E559 Vitamin D deficiency, unspecified: Secondary | ICD-10-CM

## 2014-05-16 DIAGNOSIS — Z79899 Other long term (current) drug therapy: Secondary | ICD-10-CM | POA: Insufficient documentation

## 2014-05-16 DIAGNOSIS — M150 Primary generalized (osteo)arthritis: Secondary | ICD-10-CM

## 2014-05-16 MED ORDER — AZITHROMYCIN 250 MG PO TABS: ORAL_TABLET | ORAL | Status: DC

## 2014-05-16 MED ORDER — MAGIC MOUTHWASH W/LIDOCAINE: 5.0000 mL | Freq: Four times a day (QID) | ORAL | Status: DC | PRN

## 2014-05-16 NOTE — Progress Notes (Signed)
  CARE MANAGEMENT ED NOTE 05/16/2014  Patient:  Brenda [name],Brenda [name]   Account Number:  [id number]  Date Initiated:  05/16/2014  Documentation initiated by:  Jackelyn Poling  Subjective/Objective Assessment:   49 yr old self pay Brenda [name] pt seen last at Brenda [name] on 05/15/14 and not d/c with rx Referred to Brenda [name]     Subjective/Objective Assessment Detail:   no pcp Pt saw her pulmonologist, Brenda [name] at 1000 on 05/16/14 prior to coming to Brenda [name] ED Pt given Rx from Brenda Brenda [name]  Med list 1) xanax 0.5 mg prn tid  2) Zoloft 100 mg qd  3) zpax 6 tas azithromycin  4) norvasc 5 mg qd  50 pravachol 40 mg qd  6)tramodol 50 mg tid prn     Action/Plan:   ED CM consulted by ED SW after receiving [name] call about pt and by ED charge RN when pt registered herself in to Brenda [name] ED. Discussed with pt that Brenda [name] facilities did not have [name] [name] to provide pt free meds or co pays to pay for meds. Cm sat   Action/Plan Detail:   with pt and went through goodrx for each one of her meds to help her find the pharmacy with the lowest cost and provided the discount coupons for each. CM helped pt prioritize which med to get 1st.  Discussed Brenda [name] see below   Anticipated DC Date:  05/16/2014     Status Recommendation to Physician:   Result of Recommendation:    Brenda [name]  Brenda  Brenda Services - Pt will follow up  PCP issues  Brenda [name] / Brenda [name] (established/new)    Choice offered to / List presented to:            Status of service:  Completed, signed off  ED Comments:   ED Comments Detail:  Discussed the [place] [name] CM spoke with the pt about Brenda [name] ($3 co pay for each Rx through [name] [name], does not include refills, 7 day expiration of Brenda [name] letter and choice of [name]) Pt stating she does not have any monies Reports being laid off in December 2015 but returning to the same job and having to re start her 7-90 probation period.  Cm  discussed family, friends and local churches for possible sources of co pays for meds. Pt did not think [place] letter would benefit her since she did not have monies for co pays at this time but could possibly get [name] late in week. Encouraged getting [name] pcp and setting up ongoing services with Brenda [name] and/or the local health dept CM spoke with pt who confirms self pay Brenda [name] resident with no pcp. CM discussed and provided written information for self pay pcps, importance of pcp for f/u care, [name], discounted [name] and Brenda [employer] such as financial assistance, DSS and  health department  Reviewed resources for Brenda [name] self pay pcps like Brenda [name], family medicine at Brenda [name], Brenda [name] family practice, general medical clinics, Brenda [name], Brenda [name], housing, and Brenda resources in Brenda [name]. Pt voiced understanding and appreciation of resources provided  Discussed re establishing services for the orange card she has that has expired CM sent referral to Brenda [name] and provided the contact information to the pt ED CM completed referral requesting [name] to renew orange card

## 2014-05-16 NOTE — ED Notes (Signed)
Pt needs to obtain the following medications for which she already either has a prescription or can call her PCP to write a prescription for her. Pt was seen at [facility] today at 1000 and given the phone number for a Education officer, museum and for outpatient pharmacy. Pt states she needs help "setting it up." Pt has with her phone number, resource package, and discharge instructions stating that her medication prescriptions have been refilled. Case manager contacted. Charge RN aware.

## 2014-05-16 NOTE — Discharge Instructions (Signed)
Please follow directions provided. Be sure to follow-up with your primary care doctor or use the resource guide provided to establish care with a primary care doctor to ensure you're getting better. Don't hesitate to return for any new, worsening, or concerning symptoms.   Emergency Department Resource Guide 1) Find a Doctor and Pay Out of Pocket Although you won't have to find out who is covered by your insurance plan, it is a good idea to ask around and get recommendations. You will then need to call the office and see if the doctor you have chosen will accept you as a new patient and what types of options they offer for patients who are self-pay. Some doctors offer discounts or will set up payment plans for their patients who do not have insurance, but you will need to ask so you aren't surprised when you get to your appointment.  2) Contact Your Local Health Department Not all health departments have doctors that can see patients for sick visits, but many do, so it is worth a call to see if yours does. If you don't know where your local health department is, you can check in your phone book. The CDC also has a tool to help you locate your state's health department, and many state websites also have listings of all of their local health departments.  3) Find a [facility] If your illness is not likely to be very severe or complicated, you may want to try a walk in clinic. These are popping up all over the country in pharmacies, drugstores, and shopping centers. They're usually staffed by nurse practitioners or physician assistants that have been trained to treat common illnesses and complaints. They're usually fairly quick and inexpensive. However, if you have serious medical issues or chronic medical problems, these are probably not your best option.  No Primary Care Doctor: - Call Health Connect at  [phone] - they can help you locate a primary care doctor that  accepts your insurance, provides  certain services, etc. - Physician Referral Service- [phone]  Chronic Pain Problems: Organization         Address  Phone   Notes  [facility]  [phone] Patients need to be referred by their primary care doctor.   Medication Assistance: Organization         Address  Phone   Notes  [facility] Medication [facility] [address] [place], [place] [address] [phone] --Must be a resident of [facility] -- Must have NO insurance coverage whatsoever (no Medicaid/ Medicare, etc.) -- The pt. MUST have a primary care doctor that directs their care regularly and follows them in the community   MedAssist  [phone]   [employer]  [phone]    Agencies that provide inexpensive medical care: Organization         Address  Phone   Notes  [place]  [phone]   [facility]    [phone]   [facility] [place], [place] [address] [phone]   [place] [address] [place] [phone]   Planned Parenthood    [phone]   [facility]    [phone]   [place] and [place] [address], [place] Phone:  [phone], Fax:  [phone] Hours of Operation:  9 am - 6 pm, M-F.  Also  accepts Medicaid/Medicare and self-pay.  [facility] for [address], [address], [place] Phone: [phone], Fax: [phone]. Hours of Operation:  8:30 am - 5:30 pm, M-F.  Also accepts Medicaid and self-pay.  [name] [place] [address] [place] Phone: [phone]   [place], [place], [place] [phone], Ext. [phone] Mondays & Thursdays: 7-9 AM.  First 15 patients are seen on a first come, first serve basis.    [place] Providers:  Organization         Address  Phone   Notes  [facility] [address], [place] [phone] Also accepts self-pay patients.  [facility] [address], [place]  [phone]   [place], [address], [place] [phone]   [facility] [address] [place] [phone]   Lucianne Lei [address], [place]   [phone] Only accepts [place] Access [place] patients after they have their name applied to their card.   Self-Pay (no insurance) in [facility]:  Organization         Address  Phone   Notes  Sickle Cell Patients, [facility] Internal [facility] [place] [phone]   [facility] [place] [phone]   [facility] [place]  [place], [place], [place] [phone]   [facility]/Dr. Osei-Bonsu  [address] [place] or [address], [place] [phone] Phone number for both [name] and [place] locations is the same.  Urgent Medical and [facility] [address] [place] [phone]   [facility] [address] [place] or [address] [phone]   [facility] [address], [place] [phone], phone; [phone], fax Sees patients 1st and 3rd Saturday of every month.  Must not qualify for public or private insurance (i.e. Medicaid, Medicare, [place] Health Choice, Veterans' Benefits)  Household income should be no more than 200% of the poverty level The clinic cannot treat you if you are pregnant or think you are pregnant  Sexually transmitted diseases are not treated at the clinic.    Dental Care: Organization         Address  Phone  Notes  [facility] Department of [facility] [place] [phone]  Accepts children up to age 94 who are enrolled in [place] or [place]; pregnant women with a Medicaid card; and children who have applied for Medicaid or [place] Health Choice, but were declined, whose parents can pay a reduced fee at time of service.  [facility] Department of [facility]  [address], [place] [phone] Accepts children up to age 3 who are enrolled in [place] or [place]; pregnant women with a Medicaid card; and children who have applied for Medicaid or [place] Health Choice, but were declined, whose parents can pay a reduced fee at time of service.  [place] Adult Dental Access PROGRAM  [place] [phone] Patients are seen by appointment only. Walk-ins are not accepted. [place] will see patients 32 years of age and older. Monday - Tuesday (8am-5pm)  Most Wednesdays (8:30-5pm) $30 per visit, cash only  [facility] Adult [employer] PROGRAM  [address], [facility] [phone] Patients are seen by appointment only. Walk-ins are not accepted. [place] will see patients 73 years of age and older. One Wednesday Evening (Monthly: Volunteer Based).  $30 per visit, cash only  [place]  [phone] for adults; Children under age 71, call Graduate Pediatric Dentistry at [phone]. Children aged 11-14, please call [phone] to request a pediatric application.  Dental services are provided in all areas of dental care including fillings, crowns and bridges, complete and partial dentures, implants, gum treatment, root canals, and extractions. Preventive care is also provided. Treatment is provided to both adults and children. Patients are selected via a lottery and there is often a waiting list.   [facility] [address], [place]  [phone] [email or web address]   [facility] [address] [place], [place] [phone], Ext. [phone] Second  and Fourth Thursday of each month, opens at 6:30 AM; Clinic ends at 9 AM.  Patients are seen on a first-come first-served basis, and a limited number are seen during each clinic.   [facility]  [address] Hillard Danker [place], [place] [phone]   Eligibility Requirements You must have lived in [place], [place], or [place] counties for at least the last three months.   You cannot be eligible for state or federal sponsored [employer], including [employer], [place], or [employer].   You generally cannot be eligible for healthcare insurance through your employer.    How to apply: Eligibility screenings are held every Tuesday and Wednesday afternoon from 1:00 pm until 4:00 pm. You do not need an appointment for the interview!  [facility] [address] [place], [place]   [place]  [place] Department  Day  [phone]    Behavioral Health Resources in the Community: Intensive Outpatient Programs Organization         Address  Phone  Notes  [place] [facility]. [address], [place], [place] [phone]   [facility] Outpatient [address], [place], [place]   ADS: Alcohol & Drug Svcs [address], [place], [place]   [place] [address]  [place], [place] or [phone]   Substance Abuse Resources Organization         Address  Phone  Notes  Alcohol and Drug Services  [phone]   [place]  [phone]   The [place]   Chinita Pester  [phone]   Residential & Outpatient Substance Abuse Program  [phone]   Psychological Services Organization         Address  Phone  Notes  [facility] [place]  [place]  [phone]   [place] [address], [place] or [phone]    Mobile Crisis Teams Organization         Address  Phone  Notes  Therapeutic Alternatives, Mobile Crisis Care Unit  [phone]   Assertive Psychotherapeutic Services  [address] [place], [place]   [place] Levels [address], [place] [phone]    Self-Help/Support Groups Organization         Address  Phone  Notes  Mental Health Assoc. of [place] - variety of support groups  [place] Call for more information  Narcotics Anonymous (NA), [facility] [address], [employer] [place]  2 meetings at this location   Special educational needs teacher         Address  Phone  Notes  ASAP Residential Treatment [place],    [place]  [phone]   [facility]  [address], [place] [phone], [place], [place]   [place], [place] [phone] Admissions: 8am-3pm M-F  Incentives Substance [place] [address]    [place], [place] [phone]   [facility] [address] [place], [place], [place]   [facility] [address]  [place], [place]   Insight Programs - Intensive Outpatient [address], [place], [place]   [facility] ([place].) [place].,  [place], [place] [phone] or [phone]   [facility]idential Treatment Services (RTS) [address], [place], [place] Accepts Medicaid  [facility] [place] [address],  [place] [phone] Substance Abuse/Addiction Treatment   [facility] Organization         Address  Phone  Notes  [facility]  [phone]   Domenic Schwab, PhD [address] Arlis Port[place], [place]   [phone] or [phone]   [place] [address] [place], [place]  [phone]   [facility] [address], [place], [place] [phone] Insurance/Medicaid/sponsorship through [facility] and Families [address], [address] [employer]                                    [place], [place] [phone] [place] [address], [place] [phone]    Dr. Adele Schilder  [phone]   [facility] Dept. 1) [address] [place] 2) [place] 3)  [place] [address], [place] [phone]  [phone]   [place] [phone] or [phone] (After Hours)

## 2014-05-16 NOTE — ED Notes (Signed)
Attempted to d/c patient and pt not found in room.  Will reattempt.

## 2014-05-16 NOTE — Patient Instructions (Signed)
Today we updated your med list in our EPIC system...    Continue your current medications the same...  We wrote new prescriptions for a [place] for your upper respiratory infection...  We refilled your meds per request...  Call for any questions.Marland KitchenMarland Kitchen

## 2014-05-16 NOTE — ED Provider Notes (Signed)
CSN: [id number]     Arrival date & time 05/16/14  1148 History  This chart was scribed for non-physician practitioner, Britt Bottom, working with Charlesetta Shanks, MD by Molli Posey, ED Scribe. This patient was seen in room WTR8/WTR8 and the patient's care was started at 1:20 PM.      Chief Complaint  Patient presents with  . Medication Refill   The history is provided by the patient. No language interpreter was used.   HPI Comments: Brenda [name] is a 49 y.o. female with a history of generalized anxiety disorder who presents to the Emergency Department for a medication refill. Per nursing, "Pt needs to obtain the following medications for which she already either has a prescription or can call her PCP to write a prescription for her. Pt was seen at [facility] today at 1000 and given the phone number for a Education officer, museum and for outpatient pharmacy. Pt states she needs help "setting it up." Pt has with her phone number, resource package, and discharge instructions stating that her medication prescriptions have been refilled." Pt states that she has had cold symptoms recently and that her PCP prescribed her Abx. She reports no alleviating or modifying factors.    Past Medical History  Diagnosis Date  . Allergic rhinitis, cause unspecified   . Pure hypercholesterolemia   . Esophageal reflux   . Headache(784.0)   . Generalized anxiety disorder   . Unspecified vitamin D deficiency   . Rash and other nonspecific skin eruption   . Anemia, unspecified    Past Surgical History  Procedure Laterality Date  . Tendon repair  1990    left index finger   Family History  Problem Relation Age of Onset  . Heart disease Maternal Grandfather   . Asthma Mother   . Diabetes Mother   . Arthritis Mother   . Asthma Sister   . Diabetes Sister   . Allergies Sister    History  Substance Use Topics  . Smoking status: Never Smoker   . Smokeless tobacco: Not on file  . Alcohol Use: Yes     Comment:  occasionally   OB History    No data available     Review of Systems  All other systems reviewed and are negative.     Allergies  Codeine phosphate  Home Medications   Prior to Admission medications   Medication Sig Start Date End Date Taking? Authorizing Provider  ALPRAZolam Duanne Moron) 0.5 MG tablet Take 1 tablet (0.5 mg total) by mouth 3 (three) times daily as needed for anxiety. 10/21/13   Noralee Space, MD  Alum & Mag Hydroxide-Simeth (MAGIC MOUTHWASH W/LIDOCAINE) SOLN Take 5 mLs by mouth 4 (four) times daily as needed for mouth pain. 05/16/14   Noralee Space, MD  amLODipine (NORVASC) 5 MG tablet Take 1 tablet (5 mg total) by mouth daily. 10/21/13   Noralee Space, MD  azithromycin (ZITHROMAX) 250 MG tablet Use as directed 05/16/14   Noralee Space, MD  pravastatin (PRAVACHOL) 40 MG tablet Take 40 mg by mouth daily.    Historical Provider, MD  sertraline (ZOLOFT) 100 MG tablet Take 1 tablet (100 mg total) by mouth daily. 02/12/14   Kathee Delton, MD  traMADol (ULTRAM) 50 MG tablet Take 1 tablet (50 mg total) by mouth 3 (three) times daily as needed. 10/21/13   Noralee Space, MD   BP 164/91 mmHg  Pulse 94  Temp(Src) 97.8 F (36.6 C) (Oral)  Resp 18  SpO2 100% Physical Exam  Constitutional: She is oriented to person, place, and time. She appears well-developed and well-nourished.  HENT:  Head: Normocephalic and atraumatic.  Eyes: Right eye exhibits no discharge. Left eye exhibits no discharge.  Neck: No tracheal deviation present.  Cardiovascular: Normal rate.   Pulmonary/Chest: Effort normal.  Abdominal: She exhibits no distension.  Neurological: She is alert and oriented to person, place, and time.  Skin: Skin is warm and dry.  Psychiatric: She has a normal mood and affect. Her behavior is normal.  Nursing note and vitals reviewed.   ED Course  Procedures   DIAGNOSTIC STUDIES: Oxygen Saturation is 100% on RA, normal by my interpretation.    COORDINATION OF CARE: 1:22 PM  Discussed treatment plan with pt at bedside and pt agreed to plan.   Labs Review Labs Reviewed - No data to display  Imaging Review No results found.   EKG Interpretation None      MDM   Final diagnoses:  Medication refill   49 yo presenting to the ER for assistance with meds.  She has prescriptions from her PCP and a recent ER visit but is unsure where to get them filled.  Care management provided resources to get prescriptions filled.  Pt denies any complaints and is in no acute distress.   I personally performed the services described in this documentation, which was scribed in my presence. The recorded information has been reviewed and is accurate.  Filed Vitals:   05/16/14 1154  BP: 164/91  Pulse: 94  Temp: 97.8 F (36.6 C)  TempSrc: Oral  Resp: 18  SpO2: 100%   Meds given in ED:  Medications - No data to display  Discharge Medication List as of 05/16/2014  1:25 PM          Britt Bottom, NP 05/18/14 0036  Charlesetta Shanks, MD 05/20/14 2115
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
## 2014-05-16 NOTE — ED Notes (Signed)
Pt has not returned to room, left prior to receiving d/c instructions.

## 2014-05-17 ENCOUNTER — Telehealth: Payer: Self-pay | Admitting: Pulmonary Disease

## 2014-05-17 NOTE — Telephone Encounter (Signed)
[phone] correct number for pharm

## 2014-05-17 NOTE — Telephone Encounter (Signed)
lmtcb for pt.  

## 2014-05-17 NOTE — Telephone Encounter (Signed)
Pt cb, [phone]

## 2014-05-17 NOTE — Telephone Encounter (Signed)
LMTCB x 1 

## 2014-05-18 MED ORDER — SERTRALINE HCL 100 MG PO TABS: 100.0000 mg | ORAL_TABLET | Freq: Every day | ORAL | Status: DC

## 2014-05-18 MED ORDER — PRAVASTATIN SODIUM 40 MG PO TABS: 40.0000 mg | ORAL_TABLET | Freq: Every day | ORAL | Status: DC

## 2014-05-18 MED ORDER — TRAMADOL HCL 50 MG PO TABS: 50.0000 mg | ORAL_TABLET | Freq: Three times a day (TID) | ORAL | Status: DC | PRN

## 2014-05-18 MED ORDER — ALPRAZOLAM 0.5 MG PO TABS: 0.5000 mg | ORAL_TABLET | Freq: Three times a day (TID) | ORAL | Status: DC | PRN

## 2014-05-18 MED ORDER — AMLODIPINE BESYLATE 5 MG PO TABS: 5.0000 mg | ORAL_TABLET | Freq: Every day | ORAL | Status: DC

## 2014-05-18 NOTE — Telephone Encounter (Signed)
Per SN: ok to refill Alprazolam .5mg  #90 tid prn with 5 refills, amlodipine 5mg  #90 1 po qd 3 refills, pravastatin 40mg  #90 1 po qd 3 refills, tramadol #90 tid prn with 5 refills, and zoloft 10mg  #30 1 po qhs with 5 refills.    lmtcb X1 at pharmacy to order these meds.

## 2014-05-18 NOTE — Telephone Encounter (Signed)
[phone]  Calling back

## 2014-05-18 NOTE — Telephone Encounter (Signed)
Spoke with pt, is needing the below meds called in to [facility].   Last ov 05/16/14.   Dr. Lenna Gilford are you ok with all of these refills?  Thanks!

## 2014-05-18 NOTE — Telephone Encounter (Signed)
lmtcb x3 

## 2014-05-19 NOTE — Telephone Encounter (Signed)
Spoke with pharmacy and gave refills per Dr Lenna Gilford. LMOM for pt that refills were called to pharmacy.

## 2014-10-03 ENCOUNTER — Ambulatory Visit: Payer: Self-pay | Admitting: Family Medicine

## 2014-10-18 ENCOUNTER — Ambulatory Visit: Payer: Self-pay | Admitting: Family Medicine

## 2014-12-08 ENCOUNTER — Ambulatory Visit: Payer: Self-pay

## 2014-12-08 ENCOUNTER — Encounter: Payer: Self-pay | Admitting: Podiatry

## 2014-12-08 DIAGNOSIS — M7741 Metatarsalgia, right foot: Secondary | ICD-10-CM

## 2014-12-16 ENCOUNTER — Telehealth: Payer: Self-pay | Admitting: Pulmonary Disease

## 2014-12-16 NOTE — Telephone Encounter (Signed)
Called and spoke with pt Pt is requesting refills on tramadol 50mg  TID PRN, and xanax 0.5mg  TID PRN Both rx were last filled on 05/18/14 #90 with 5 refills Pt last OV was 05/16/2014 No f/u appt scheduled  Dr Lenna Gilford, please advise. Thanks   Allergies  Allergen Reactions  . Codeine Phosphate     REACTION: vomiting

## 2014-12-19 NOTE — Telephone Encounter (Signed)
Per SN>> ok to refill xanax and tramadol one last time with 1 additional refill  Please inform pt that no other refills will be given in future. Pt needs to find new PCP since SN is only seeing pulmonary patients now and he has semi-retired  Give pt info for [facility] downstairs and tell her any dr downstairs can take care of her needs  Called pt and had to leave voicemail to call office back  Waiting on return call

## 2014-12-19 NOTE — Telephone Encounter (Signed)
Dr. Lenna Gilford, please advise if okay to refill Tramadol.

## 2014-12-20 MED ORDER — TRAMADOL HCL 50 MG PO TABS: 50.0000 mg | ORAL_TABLET | Freq: Three times a day (TID) | ORAL | Status: DC | PRN

## 2014-12-20 MED ORDER — ALPRAZOLAM 0.5 MG PO TABS: 0.5000 mg | ORAL_TABLET | Freq: Three times a day (TID) | ORAL | Status: DC | PRN

## 2014-12-20 NOTE — Telephone Encounter (Signed)
Pt called office back and was informed that SN would refill her xanax and tramadol one last time Pt was instructed that SN will not approve any further refills due to him not seeing any primary pts at this time and he has semi-retired Pt was instructed to establish with a different PCP to be able to get additional refills on these medicaions  Pt voiced understanding of all of this and said she would establish with another PCP Pt requested that refills be called into her pharmacy  Medication called into pt's pharmacy  Nothing further is needed at this time

## 2014-12-20 NOTE — Telephone Encounter (Signed)
lmtcb for pt.  

## 2015-01-27 ENCOUNTER — Other Ambulatory Visit: Payer: Self-pay | Admitting: Pulmonary Disease

## 2015-01-27 MED ORDER — SERTRALINE HCL 100 MG PO TABS: 100.0000 mg | ORAL_TABLET | Freq: Every day | ORAL | Status: DC

## 2015-01-27 NOTE — Telephone Encounter (Signed)
Received paper refill request to refill pt's Sertraline 100mg  Refill sent to pharmacy electronically  Nothing further is needed at this time.

## 2015-02-20 ENCOUNTER — Telehealth: Payer: Self-pay | Admitting: Pulmonary Disease

## 2015-02-20 NOTE — Telephone Encounter (Signed)
Spoke with pt, states she needs several refills but is unsure of what needs to be refilled.  States this rx request will be faxed to [place]. Checked fax and SN's folder, no requests yet. [facility], states that pt needs refill on sertraline 100mg .  Last filled 12/16/14.  Per pt's chart this was filled on 01/27/15 but they do not have this rx on file.  Gave VO for this rx that was to be called in last month.  Nothing further needed.

## 2015-03-22 ENCOUNTER — Encounter: Payer: Self-pay | Admitting: Internal Medicine

## 2015-03-22 ENCOUNTER — Ambulatory Visit (INDEPENDENT_AMBULATORY_CARE_PROVIDER_SITE_OTHER): Payer: BLUE CROSS/BLUE SHIELD | Admitting: Internal Medicine

## 2015-03-22 ENCOUNTER — Other Ambulatory Visit (INDEPENDENT_AMBULATORY_CARE_PROVIDER_SITE_OTHER): Payer: BLUE CROSS/BLUE SHIELD

## 2015-03-22 VITALS — BP 142/78 | HR 80 | Temp 98.4°F | Resp 14 | Wt 145.4 lb

## 2015-03-22 DIAGNOSIS — M150 Primary generalized (osteo)arthritis: Secondary | ICD-10-CM | POA: Diagnosis not present

## 2015-03-22 DIAGNOSIS — Z23 Encounter for immunization: Secondary | ICD-10-CM | POA: Diagnosis not present

## 2015-03-22 DIAGNOSIS — F411 Generalized anxiety disorder: Secondary | ICD-10-CM | POA: Diagnosis not present

## 2015-03-22 DIAGNOSIS — Z0000 Encounter for general adult medical examination without abnormal findings: Secondary | ICD-10-CM | POA: Diagnosis not present

## 2015-03-22 DIAGNOSIS — M159 Polyosteoarthritis, unspecified: Secondary | ICD-10-CM

## 2015-03-22 DIAGNOSIS — I10 Essential (primary) hypertension: Secondary | ICD-10-CM

## 2015-03-22 LAB — CBC
HCT: 38.3 % (ref 36.0–46.0)
Hemoglobin: 12.7 g/dL (ref 12.0–15.0)
MCHC: 33.1 g/dL (ref 30.0–36.0)
MCV: 78.5 fl (ref 78.0–100.0)
PLATELETS: 402 10*3/uL — AB (ref 150.0–400.0)
RBC: 4.88 Mil/uL (ref 3.87–5.11)
RDW: 13.5 % (ref 11.5–15.5)
WBC: 6 10*3/uL (ref 4.0–10.5)

## 2015-03-22 LAB — COMPREHENSIVE METABOLIC PANEL
ALT: 12 U/L (ref 0–35)
AST: 12 U/L (ref 0–37)
Albumin: 4.4 g/dL (ref 3.5–5.2)
Alkaline Phosphatase: 60 U/L (ref 39–117)
BUN: 10 mg/dL (ref 6–23)
CO2: 27 meq/L (ref 19–32)
Calcium: 9.4 mg/dL (ref 8.4–10.5)
Chloride: 103 mEq/L (ref 96–112)
Creatinine, Ser: 0.56 mg/dL (ref 0.40–1.20)
GFR: 146.4 mL/min (ref >60.00)
GLUCOSE: 122 mg/dL — AB (ref 70–99)
Potassium: 4 mEq/L (ref 3.5–5.1)
SODIUM: 138 meq/L (ref 135–145)
Total Bilirubin: 0.5 mg/dL (ref 0.2–1.2)
Total Protein: 7.6 g/dL (ref 6.0–8.3)

## 2015-03-22 LAB — LIPID PANEL
Cholesterol: 160 mg/dL (ref 0–200)
HDL: 56.6 mg/dL (ref >39.00)
LDL CALC: 95 mg/dL (ref 0–99)
NonHDL: 103.11
Total CHOL/HDL Ratio: 3
Triglycerides: 42 mg/dL (ref 0.0–149.0)
VLDL: 8.4 mg/dL (ref 0.0–40.0)

## 2015-03-22 LAB — HEMOGLOBIN A1C: HEMOGLOBIN A1C: 6.6 % — AB (ref 4.6–6.5)

## 2015-03-22 MED ORDER — PANTOPRAZOLE SODIUM 40 MG PO TBEC: 40.0000 mg | DELAYED_RELEASE_TABLET | Freq: Every day | ORAL | Status: DC

## 2015-03-22 MED ORDER — SERTRALINE HCL 100 MG PO TABS: 200.0000 mg | ORAL_TABLET | Freq: Every day | ORAL | Status: DC

## 2015-03-22 MED ORDER — DICLOFENAC SODIUM 75 MG PO TBEC: 75.0000 mg | DELAYED_RELEASE_TABLET | Freq: Two times a day (BID) | ORAL | Status: DC

## 2015-03-22 NOTE — Progress Notes (Signed)
Pre visit review using our clinic review tool, if applicable. No additional management support is needed unless otherwise documented below in the visit note. 

## 2015-03-22 NOTE — Patient Instructions (Signed)
We will check the labs today and call you back about the results.   We have sent in protonix for the acid reflux that you can take daily.   We have sent in voltaren which is a little stronger for the joints that you can take twice a day for the pain.   We have sent in the increased zoloft that you take 2 pills daily to see if this helps more with the mood.   We have given you the flu and tetanus shot.   Health Maintenance, Female Adopting a healthy lifestyle and getting preventive care can go a long way to promote health and wellness. Talk with your health care provider about what schedule of regular examinations is right for you. This is a good chance for you to check in with your provider about disease prevention and staying healthy. In between checkups, there are plenty of things you can do on your own. Experts have done a lot of research about which lifestyle changes and preventive measures are most likely to keep you healthy. Ask your health care provider for more information. WEIGHT AND DIET  Eat a healthy diet  Be sure to include plenty of vegetables, fruits, low-fat dairy products, and lean protein.  Do not eat a lot of foods high in solid fats, added sugars, or salt.  Get regular exercise. This is one of the most important things you can do for your health.  Most adults should exercise for at least 150 minutes each week. The exercise should increase your heart rate and make you sweat (moderate-intensity exercise).  Most adults should also do strengthening exercises at least twice a week. This is in addition to the moderate-intensity exercise.  Maintain a healthy weight  Body mass index (BMI) is a measurement that can be used to identify possible weight problems. It estimates body fat based on height and weight. Your health care provider can help determine your BMI and help you achieve or maintain a healthy weight.  For females 63 years of age and older:   A BMI below 18.5 is  considered underweight.  A BMI of 18.5 to 24.9 is normal.  A BMI of 25 to 29.9 is considered overweight.  A BMI of 30 and above is considered obese.  Watch levels of cholesterol and blood lipids  You should start having your blood tested for lipids and cholesterol at 52 years of age, then have this test every 5 years.  You may need to have your cholesterol levels checked more often if:  Your lipid or cholesterol levels are high.  You are older than 52 years of age.  You are at high risk for heart disease.  CANCER SCREENING   Lung Cancer  Lung cancer screening is recommended for adults 55-68 years old who are at high risk for lung cancer because of a history of smoking.  A yearly low-dose CT scan of the lungs is recommended for people who:  Currently smoke.  Have quit within the past 15 years.  Have at least a 30-pack-year history of smoking. A pack year is smoking an average of one pack of cigarettes a day for 1 year.  Yearly screening should continue until it has been 15 years since you quit.  Yearly screening should stop if you develop a health problem that would prevent you from having lung cancer treatment.  Breast Cancer  Practice breast self-awareness. This means understanding how your breasts normally appear and feel.  It also means doing  regular breast self-exams. Let your health care provider know about any changes, no matter how small.  If you are in your 20s or 30s, you should have a clinical breast exam (CBE) by a health care provider every 1-3 years as part of a regular health exam.  If you are 69 or older, have a CBE every year. Also consider having a breast X-ray (mammogram) every year.  If you have a family history of breast cancer, talk to your health care provider about genetic screening.  If you are at high risk for breast cancer, talk to your health care provider about having an MRI and a mammogram every year.  Breast cancer gene (BRCA)  assessment is recommended for women who have family members with BRCA-related cancers. BRCA-related cancers include:  Breast.  Ovarian.  Tubal.  Peritoneal cancers.  Results of the assessment will determine the need for genetic counseling and BRCA1 and BRCA2 testing. Cervical Cancer Your health care provider may recommend that you be screened regularly for cancer of the pelvic organs (ovaries, uterus, and vagina). This screening involves a pelvic examination, including checking for microscopic changes to the surface of your cervix (Pap test). You may be encouraged to have this screening done every 3 years, beginning at age 73.  For women ages 11-65, health care providers may recommend pelvic exams and Pap testing every 3 years, or they may recommend the Pap and pelvic exam, combined with testing for human papilloma virus (HPV), every 5 years. Some types of HPV increase your risk of cervical cancer. Testing for HPV may also be done on women of any age with unclear Pap test results.  Other health care providers may not recommend any screening for nonpregnant women who are considered low risk for pelvic cancer and who do not have symptoms. Ask your health care provider if a screening pelvic exam is right for you.  If you have had past treatment for cervical cancer or a condition that could lead to cancer, you need Pap tests and screening for cancer for at least 20 years after your treatment. If Pap tests have been discontinued, your risk factors (such as having a new sexual partner) need to be reassessed to determine if screening should resume. Some women have medical problems that increase the chance of getting cervical cancer. In these cases, your health care provider may recommend more frequent screening and Pap tests. Colorectal Cancer  This type of cancer can be detected and often prevented.  Routine colorectal cancer screening usually begins at 52 years of age and continues through 52 years  of age.  Your health care provider may recommend screening at an earlier age if you have risk factors for colon cancer.  Your health care provider may also recommend using home test kits to check for hidden blood in the stool.  A small camera at the end of a tube can be used to examine your colon directly (sigmoidoscopy or colonoscopy). This is done to check for the earliest forms of colorectal cancer.  Routine screening usually begins at age 68.  Direct examination of the colon should be repeated every 5-10 years through 52 years of age. However, you may need to be screened more often if early forms of precancerous polyps or small growths are found. Skin Cancer  Check your skin from head to toe regularly.  Tell your health care provider about any new moles or changes in moles, especially if there is a change in a mole's shape or color.  Also tell your health care provider if you have a mole that is larger than the size of a pencil eraser.  Always use sunscreen. Apply sunscreen liberally and repeatedly throughout the day.  Protect yourself by wearing long sleeves, pants, a wide-brimmed hat, and sunglasses whenever you are outside. HEART DISEASE, DIABETES, AND HIGH BLOOD PRESSURE   High blood pressure causes heart disease and increases the risk of stroke. High blood pressure is more likely to develop in:  People who have blood pressure in the high end of the normal range (130-139/85-89 mm Hg).  People who are overweight or obese.  People who are African American.  If you are 68-2 years of age, have your blood pressure checked every 3-5 years. If you are 67 years of age or older, have your blood pressure checked every year. You should have your blood pressure measured twice--once when you are at a hospital or clinic, and once when you are not at a hospital or clinic. Record the average of the two measurements. To check your blood pressure when you are not at a hospital or clinic, you  can use:  An automated blood pressure machine at a pharmacy.  A home blood pressure monitor.  If you are between 85 years and 65 years old, ask your health care provider if you should take aspirin to prevent strokes.  Have regular diabetes screenings. This involves taking a blood sample to check your fasting blood sugar level.  If you are at a normal weight and have a low risk for diabetes, have this test once every three years after 52 years of age.  If you are overweight and have a high risk for diabetes, consider being tested at a younger age or more often. PREVENTING INFECTION  Hepatitis B  If you have a higher risk for hepatitis B, you should be screened for this virus. You are considered at high risk for hepatitis B if:  You were born in a country where hepatitis B is common. Ask your health care provider which countries are considered high risk.  Your parents were born in a high-risk country, and you have not been immunized against hepatitis B (hepatitis B vaccine).  You have HIV or AIDS.  You use needles to inject street drugs.  You live with someone who has hepatitis B.  You have had sex with someone who has hepatitis B.  You get hemodialysis treatment.  You take certain medicines for conditions, including cancer, organ transplantation, and autoimmune conditions. Hepatitis C  Blood testing is recommended for:  Everyone born from [birth year] through 1967.  Anyone with known risk factors for hepatitis C. Sexually transmitted infections (STIs)  You should be screened for sexually transmitted infections (STIs) including gonorrhea and chlamydia if:  You are sexually active and are younger than 52 years of age.  You are older than 52 years of age and your health care provider tells you that you are at risk for this type of infection.  Your sexual activity has changed since you were last screened and you are at an increased risk for chlamydia or gonorrhea. Ask your health  care provider if you are at risk.  If you do not have HIV, but are at risk, it may be recommended that you take a prescription medicine daily to prevent HIV infection. This is called pre-exposure prophylaxis (PrEP). You are considered at risk if:  You are sexually active and do not regularly use condoms or know the HIV status of your  partner(s).  You take drugs by injection.  You are sexually active with a partner who has HIV. Talk with your health care provider about whether you are at high risk of being infected with HIV. If you choose to begin PrEP, you should first be tested for HIV. You should then be tested every 3 months for as long as you are taking PrEP.  PREGNANCY   If you are premenopausal and you may become pregnant, ask your health care provider about preconception counseling.  If you may become pregnant, take 400 to 800 micrograms (mcg) of folic acid every day.  If you want to prevent pregnancy, talk to your health care provider about birth control (contraception). OSTEOPOROSIS AND MENOPAUSE   Osteoporosis is a disease in which the bones lose minerals and strength with aging. This can result in serious bone fractures. Your risk for osteoporosis can be identified using a bone density scan.  If you are 82 years of age or older, or if you are at risk for osteoporosis and fractures, ask your health care provider if you should be screened.  Ask your health care provider whether you should take a calcium or vitamin D supplement to lower your risk for osteoporosis.  Menopause may have certain physical symptoms and risks.  Hormone replacement therapy may reduce some of these symptoms and risks. Talk to your health care provider about whether hormone replacement therapy is right for you.  HOME CARE INSTRUCTIONS   Schedule regular health, dental, and eye exams.  Stay current with your immunizations.   Do not use any tobacco products including cigarettes, chewing tobacco, or  electronic cigarettes.  If you are pregnant, do not drink alcohol.  If you are breastfeeding, limit how much and how often you drink alcohol.  Limit alcohol intake to no more than 1 drink per day for nonpregnant women. One drink equals 12 ounces of beer, 5 ounces of wine, or 1 ounces of hard liquor.  Do not use street drugs.  Do not share needles.  Ask your health care provider for help if you need support or information about quitting drugs.  Tell your health care provider if you often feel depressed.  Tell your health care provider if you have ever been abused or do not feel safe at home.   This information is not intended to replace advice given to you by your health care provider. Make sure you discuss any questions you have with your health care provider.   Document Released: 08/13/2010 Document Revised: 02/18/2014 Document Reviewed: 12/30/2012 Elsevier Interactive Patient Education [employer].

## 2015-03-23 LAB — HEPATITIS C ANTIBODY: HCV Ab: NEGATIVE

## 2015-03-24 NOTE — Assessment & Plan Note (Signed)
BP at goal on amlodipine. Talked to her about the fact that her weight is likely causing her high blood pressure and she needs to exercise.

## 2015-03-24 NOTE — Assessment & Plan Note (Addendum)
Per [place] narcotic database she is filling her tramadol every 2 months. She did not ask for refill at our visit until after the visit. Last fill was 1 month ago so will not fill at this time. Rx for voltaren pills to see if these help more. She may also be a good candidate for knee injection since this is her prominent painful joint.

## 2015-03-24 NOTE — Assessment & Plan Note (Signed)
Would appear that she is filling her alprazolam every 2 months per [place] narcotic database. She has just filled 1 month ago so will not refill today. She is also taking zoloft which we will increase for better efficacy to 200 mg daily.

## 2015-03-24 NOTE — Progress Notes (Signed)
   Subjective:    Patient ID: Brenda [name], female    DOB: 10/17/1965, 50 y.o.   MRN: [id number]  HPI The patient is a new 50 YO female coming in for some joint pain. She has been tried on aleve over the counter which did not help. She has most of her pain in her knee. Has not seen a sports med or orthopedic for it. Has taken tramadol in the past. She denies recent injury or swelling. Has not tried heat or ice. She is not exercising.  PMH, [facility], social history reviewed and updated.   Review of Systems  Constitutional: Negative for activity change, appetite change, fatigue and unexpected weight change.  HENT: Negative.   Eyes: Negative.   Respiratory: Negative for cough, chest tightness, shortness of breath and wheezing.   Cardiovascular: Negative for chest pain, palpitations and leg swelling.  Gastrointestinal: Negative for nausea, abdominal pain, diarrhea, constipation and abdominal distention.  Musculoskeletal: Positive for arthralgias. Negative for myalgias, back pain and joint swelling.  Skin: Negative.   Neurological: Negative.   Psychiatric/Behavioral: Negative.       Objective:   Physical Exam  Constitutional: She is oriented to person, place, and time. She appears well-developed and well-nourished.  HENT:  Head: Normocephalic and atraumatic.  Eyes: EOM are normal.  Neck: Normal range of motion.  Cardiovascular: Normal rate and regular rhythm.   Pulmonary/Chest: Effort normal and breath sounds normal. No respiratory distress. She has no wheezes. She has no rales.  Abdominal: Soft. Bowel sounds are normal. She exhibits no distension. There is no tenderness. There is no rebound.  Musculoskeletal: She exhibits no edema.  Neurological: She is alert and oriented to person, place, and time. Coordination normal.  Skin: Skin is warm and dry.  Psychiatric: She has a normal mood and affect.   Filed Vitals:   03/22/15 0916  BP: 142/78  Pulse: 80  Temp: 98.4 F (36.9 C)    TempSrc: Oral  Resp: 14  Weight: 145 lb 6.4 oz (65.953 kg)  SpO2: 98%      Assessment & Plan:  Tdap and Flu given at visit.

## 2015-03-28 ENCOUNTER — Other Ambulatory Visit: Payer: Self-pay | Admitting: Internal Medicine

## 2015-03-28 NOTE — Telephone Encounter (Signed)
Faxed to pharmacy

## 2015-04-03 ENCOUNTER — Telehealth: Payer: Self-pay | Admitting: *Deleted

## 2015-04-03 NOTE — Telephone Encounter (Signed)
Per [place] narcotic database data she was only filling every 2 months which would mean she uses 45 per month. This is stable for the last 6 months.

## 2015-04-03 NOTE — Telephone Encounter (Signed)
Left msg on triage stating she want md to increase her alprazolam back to # 90. She takes 1 pill three times a day, and she is running out early...Brenda [name]

## 2015-04-04 NOTE — Telephone Encounter (Signed)
Called pt no answer left detail msg with md response...Brenda [name]

## 2015-04-07 ENCOUNTER — Encounter: Payer: Self-pay | Admitting: Internal Medicine

## 2015-05-12 NOTE — Progress Notes (Signed)
This encounter was created in error - please disregard.

## 2015-06-05 ENCOUNTER — Other Ambulatory Visit: Payer: Self-pay | Admitting: Internal Medicine

## 2015-07-06 ENCOUNTER — Other Ambulatory Visit: Payer: Self-pay | Admitting: Internal Medicine

## 2015-07-06 NOTE — Telephone Encounter (Signed)
Refill done.  

## 2015-07-06 NOTE — Telephone Encounter (Signed)
Sent to pharmacy 

## 2015-07-06 NOTE — Telephone Encounter (Signed)
Left msg on triage requesting renewal on alprazolam.../lmb

## 2015-09-18 ENCOUNTER — Ambulatory Visit: Payer: BLUE CROSS/BLUE SHIELD | Admitting: Internal Medicine

## 2015-09-20 ENCOUNTER — Ambulatory Visit: Payer: BLUE CROSS/BLUE SHIELD | Admitting: Internal Medicine

## 2015-09-21 ENCOUNTER — Other Ambulatory Visit: Payer: Self-pay | Admitting: Internal Medicine

## 2015-10-17 ENCOUNTER — Other Ambulatory Visit (INDEPENDENT_AMBULATORY_CARE_PROVIDER_SITE_OTHER): Payer: BLUE CROSS/BLUE SHIELD

## 2015-10-17 ENCOUNTER — Ambulatory Visit (INDEPENDENT_AMBULATORY_CARE_PROVIDER_SITE_OTHER): Payer: BLUE CROSS/BLUE SHIELD | Admitting: Internal Medicine

## 2015-10-17 ENCOUNTER — Encounter: Payer: Self-pay | Admitting: Internal Medicine

## 2015-10-17 VITALS — BP 142/60 | HR 79 | Temp 98.4°F | Resp 12 | Ht 62.0 in | Wt 146.0 lb

## 2015-10-17 DIAGNOSIS — R7301 Impaired fasting glucose: Secondary | ICD-10-CM | POA: Diagnosis not present

## 2015-10-17 DIAGNOSIS — F32A Depression, unspecified: Secondary | ICD-10-CM

## 2015-10-17 DIAGNOSIS — F329 Major depressive disorder, single episode, unspecified: Secondary | ICD-10-CM | POA: Diagnosis not present

## 2015-10-17 DIAGNOSIS — M150 Primary generalized (osteo)arthritis: Secondary | ICD-10-CM | POA: Diagnosis not present

## 2015-10-17 DIAGNOSIS — E119 Type 2 diabetes mellitus without complications: Secondary | ICD-10-CM | POA: Insufficient documentation

## 2015-10-17 DIAGNOSIS — Z23 Encounter for immunization: Secondary | ICD-10-CM | POA: Diagnosis not present

## 2015-10-17 DIAGNOSIS — R21 Rash and other nonspecific skin eruption: Secondary | ICD-10-CM | POA: Insufficient documentation

## 2015-10-17 DIAGNOSIS — M159 Polyosteoarthritis, unspecified: Secondary | ICD-10-CM

## 2015-10-17 DIAGNOSIS — I10 Essential (primary) hypertension: Secondary | ICD-10-CM

## 2015-10-17 LAB — HEMOGLOBIN A1C: HEMOGLOBIN A1C: 6.8 % — AB (ref 4.6–6.5)

## 2015-10-17 LAB — TSH: TSH: 1.54 u[IU]/mL (ref 0.35–4.50)

## 2015-10-17 MED ORDER — ALPRAZOLAM 0.5 MG PO TABS: ORAL_TABLET | ORAL | 1 refills | Status: DC

## 2015-10-17 MED ORDER — TRIAMCINOLONE ACETONIDE 0.1 % EX CREA: 1.0000 "application " | TOPICAL_CREAM | Freq: Two times a day (BID) | CUTANEOUS | 0 refills | Status: DC

## 2015-10-17 NOTE — Assessment & Plan Note (Signed)
Checking HgA1c as last was high. She was encouraged to make some changes with diet and exercise.

## 2015-10-17 NOTE — Assessment & Plan Note (Signed)
Appears to be scratch damage to the skin. Not consistent with bug or insect bites. Rx for triamcinolone cream and encouraged her to use zyrtec for allergies and benadryl at night time for the itching.

## 2015-10-17 NOTE — Progress Notes (Signed)
   Subjective:    Patient ID: Brenda [name], female    DOB: 1965-04-19, 50 y.o.   MRN: [id number]  HPI The patient is a 50 YO female coming in for many concerns including follow up of her anxiety (we had increased her zoloft to 200 mg daily at last visit, she feels that this is doing well, her xanax usage is slightly down per fill record but she states she is taking BID and running out early, with previous PCP was getting 90 per 2 months, now getting 45 and filling about every 5 weeks or slightly longer, requesting increase today temporary due to recent passing of her father about 2 weeks ago) and her joint pain (we started her on voltaren for the pain which is not helping much, she is ready to seek care for her knee as this limits her activity, she has filled tramadol twice in the last 6 months, verified on [place] narcotic database, both for 45 and has not filled since that time). She is also following up on her sugars which were high last time and close to diabetes (she was supposed to work on more exercise and diet changes, she has not been able to do that, weight is stable). She is also having some hot flashes (she will be willing to talk to her gynecologist about this) and a new rash on her arm (she thinks it is from the stress recently, not sure if it is insect bites, she wakes up with itching and she is scratching).   Review of Systems  Constitutional: Negative for activity change, appetite change, fatigue and unexpected weight change.  HENT: Negative.   Eyes: Negative.   Respiratory: Negative for cough, chest tightness, shortness of breath and wheezing.   Cardiovascular: Negative for chest pain, palpitations and leg swelling.  Gastrointestinal: Negative for abdominal distention, abdominal pain, constipation, diarrhea and nausea.  Musculoskeletal: Positive for arthralgias. Negative for back pain, joint swelling and myalgias.  Skin: Positive for rash and wound.  Neurological: Negative.     Psychiatric/Behavioral: Positive for decreased concentration. Negative for agitation, behavioral problems, dysphoric mood, sleep disturbance and suicidal ideas. The patient is nervous/anxious.       Objective:   Physical Exam  Constitutional: She is oriented to person, place, and time. She appears well-developed and well-nourished.  HENT:  Head: Normocephalic and atraumatic.  Eyes: EOM are normal.  Neck: Normal range of motion.  Cardiovascular: Normal rate and regular rhythm.   Pulmonary/Chest: Effort normal and breath sounds normal. No respiratory distress. She has no wheezes. She has no rales.  Abdominal: Soft. Bowel sounds are normal. She exhibits no distension. There is no tenderness. There is no rebound.  Musculoskeletal: She exhibits no edema.  Neurological: She is alert and oriented to person, place, and time. Coordination normal.  Skin: Skin is warm and dry.  Some sores on her arms which do not appear consistent with insect bites or bed bugs. No infection or surrounding cellulitis.   Psychiatric: She has a normal mood and affect.   Vitals:   10/17/15 0813  BP: (!) 150/82  Pulse: 79  Resp: 12  Temp: 98.4 F (36.9 C)  TempSrc: Oral  SpO2: 98%  Weight: 146 lb (66.2 kg)  Height: 5\' 2"  (1.575 m)      Assessment & Plan:  Flu shot given at visit.

## 2015-10-17 NOTE — Assessment & Plan Note (Signed)
She is taking zoloft 200 mg daily and is not sure if she saw increase in mood from the change. She insists that she is taking twice a day and the reason she is only filling every 5 weeks is that she cannot get to the pharmacy from her job. She will have temporary increase to #60 xanax for 2 months while she is dealing with the stress. If she is not filling monthly the amount will be decreased again.

## 2015-10-17 NOTE — Progress Notes (Signed)
Pre visit review using our clinic review tool, if applicable. No additional management support is needed unless otherwise documented below in the visit note. 

## 2015-10-17 NOTE — Patient Instructions (Signed)
We have sent in the refill of the alprazolam which has been increased for the increase in stress. This will be a temporary increase and we can go back down to taking just 1 time per day and sometimes twice when you can.   We are checking the labs today and will send the results on mychart.   We have sent in the cream for your spots and they can be allergy related so start taking zyrtec to see if this helps.

## 2015-10-17 NOTE — Assessment & Plan Note (Signed)
She will schedule with sports medicine (referral placed today) and continue using voltaren pills daily and tramadol rarely. No refill of that provided today as she did not request.

## 2015-10-17 NOTE — Assessment & Plan Note (Signed)
BP at goal on her amlodipine 5 mg daily, no changes today.

## 2015-11-17 NOTE — Progress Notes (Deleted)
Tawana ScaleZach Smith D.O. [facility] [address] [place], [place] [address] Phone: [phone] Subjective:    I'm seeing this patient by the request  of:  Myrlene BrokerElizabeth A Crawford, MD   CC: Knee pain  OZH:[id number]:Subjective  Brenda [name] is a 50 y.o. female coming in with complaint of ***     Past Medical History:  Diagnosis Date  . Allergic rhinitis, cause unspecified   . Anemia, unspecified   . Esophageal reflux   . Generalized anxiety disorder   . Headache(784.0)   . Pure hypercholesterolemia   . Rash and other nonspecific skin eruption   . Unspecified vitamin D deficiency    Past Surgical History:  Procedure Laterality Date  . TENDON REPAIR  1990   left index finger   Social History   Social History  . Marital status: Single    Spouse name: N/A  . Number of children: N/A  . Years of education: N/A   Occupational History  . security guard    Social History Main Topics  . Smoking status: Never Smoker  . Smokeless tobacco: Not on file  . Alcohol use Yes     Comment: occasionally  . Drug use: No  . Sexual activity: Not Currently   Other Topics Concern  . Not on file   Social History Narrative  . No narrative on file   Allergies  Allergen Reactions  . Codeine Phosphate     REACTION: vomiting   Family History  Problem Relation Age of Onset  . Heart disease Maternal Grandfather   . Asthma Mother   . Diabetes Mother   . Arthritis Mother   . Asthma Sister   . Diabetes Sister   . Allergies Sister     Past medical history, social, surgical and family history all reviewed in electronic medical record.  No pertanent information unless stated regarding to the chief complaint.   Review of Systems: No headache, visual changes, nausea, vomiting, diarrhea, constipation, dizziness, abdominal pain, skin rash, fevers, chills, night sweats, weight loss, swollen lymph nodes, body aches, joint swelling, muscle aches, chest pain, shortness of breath, mood changes.    Objective  Last menstrual period 11/26/2013.  General: No apparent distress alert and oriented x3 mood and affect normal, dressed appropriately.  HEENT: Pupils equal, extraocular movements intact  Respiratory: Patient's speak in full sentences and does not appear short of breath  Cardiovascular: No lower extremity edema, non tender, no erythema  Skin: Warm dry intact with no signs of infection or rash on extremities or on axial skeleton.  Abdomen: Soft nontender  Neuro: Cranial nerves II through XII are intact, neurovascularly intact in all extremities with 2+ DTRs and 2+ pulses.  Lymph: No lymphadenopathy of posterior or anterior cervical chain or axillae bilaterally.  Gait normal with good balance and coordination.  MSK:  Non tender with full range of motion and good stability and symmetric strength and tone of shoulders, elbows, wrist, hip and ankles bilaterally.  Knee: Normal to inspection with no erythema or effusion or obvious bony abnormalities. Palpation normal with no warmth, joint line tenderness, patellar tenderness, or condyle tenderness. ROM full in flexion and extension and lower leg rotation. Ligaments with solid consistent endpoints including ACL, PCL, LCL, MCL. Negative Mcmurray's, Apley's, and Thessalonian tests. Non painful patellar compression. Patellar glide without crepitus. Patellar and quadriceps tendons unremarkable. Hamstring and quadriceps strength is normal.   MSK US performed of: *** This study was ordered, performed, and interpreted by Terrilee FilesZach Smith  D.O.  Knee: All structures visualized. Anteromedial, anterolateral, posteromedial, and posterolateral menisci unremarkable without tearing, fraying, effusion, or displacement. Patellar Tendon unremarkable on long and transverse views without effusion. No abnormality of prepatellar bursa. LCL and MCL unremarkable on long and transverse views. No abnormality of origin of medial or lateral head of the  gastrocnemius.  IMPRESSION:  NORMAL ULTRASONOGRAPHIC EXAMINATION OF THE KNEE.    Impression and Recommendations:     This case required medical decision making of moderate complexity.      Note: This dictation was prepared with Dragon dictation along with smaller phrase technology. Any transcriptional errors that result from this process are unintentional.

## 2015-11-20 ENCOUNTER — Ambulatory Visit: Payer: BLUE CROSS/BLUE SHIELD | Admitting: Family Medicine

## 2015-12-01 ENCOUNTER — Ambulatory Visit: Payer: BLUE CROSS/BLUE SHIELD | Admitting: Family

## 2015-12-01 DIAGNOSIS — Z0289 Encounter for other administrative examinations: Secondary | ICD-10-CM

## 2015-12-26 ENCOUNTER — Encounter: Payer: Self-pay | Admitting: Family

## 2015-12-26 ENCOUNTER — Ambulatory Visit (INDEPENDENT_AMBULATORY_CARE_PROVIDER_SITE_OTHER)
Admission: RE | Admit: 2015-12-26 | Discharge: 2015-12-26 | Disposition: A | Discharge status: Home / Self Care | Payer: BLUE CROSS/BLUE SHIELD | Source: Ambulatory Visit | Attending: Family | Admitting: Family

## 2015-12-26 ENCOUNTER — Ambulatory Visit (INDEPENDENT_AMBULATORY_CARE_PROVIDER_SITE_OTHER): Payer: BLUE CROSS/BLUE SHIELD | Admitting: Family

## 2015-12-26 DIAGNOSIS — G8929 Other chronic pain: Secondary | ICD-10-CM | POA: Diagnosis not present

## 2015-12-26 DIAGNOSIS — M25561 Pain in right knee: Secondary | ICD-10-CM

## 2015-12-26 MED ORDER — IBUPROFEN-FAMOTIDINE 800-26.6 MG PO TABS: 1.0000 | ORAL_TABLET | Freq: Three times a day (TID) | ORAL | 1 refills | Status: DC | PRN

## 2015-12-26 NOTE — Assessment & Plan Note (Signed)
Right knee pain with previous hemorrhagic bursitis in 2014 and continued knee pain that is along the medial joint line. Differentials included patellofemoral syndrome, osteoarthritis, or chronic meniscal injury. Corticosteroid injection provided without complication. Obtain x-ray.  Consider possible unloading brace if appropriate. Home exercise therapy and start Vimovo. Follow up if symptoms do not improve.
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
## 2015-12-26 NOTE — Patient Instructions (Signed)
Thank you for choosing [facility]!  Ice / moist heat x 20 minutes every 2 hours and as needed or following activity  Exercises 1-2 times per day as instructed.   Please stop by radiology on the basement level of the building for your x-rays. Your results will be released to [facility] (or called to you) after review, usually within 72 hours after test completion. If any treatments or changes are necessary, you will be notified at that same time.  If your symptoms worsen or fail to improve, please contact our office for further instruction, or in case of emergency go directly to the emergency room at the closest medical facility.    Meniscus Tear, Phase I Rehab After Surgery Ask your health care provider which exercises are safe for you. Do exercises exactly as told by your health care provider and adjust them as directed. It is normal to feel mild stretching, pulling, tightness, or discomfort as you do these exercises, but you should stop right away if you feel sudden pain or your pain gets worse.Do not begin these exercises until told by your health care provider. If told by your health care provider, wear your brace while you do these exercises. Stretching and range of motion exercises These exercises warm up your muscles and joints and improve the movement and flexibility of your knee. These exercises also help to relieve pain and stiffness. Exercise A: Knee flexion, passive (supine) 1. Start this exercise in one of these positions:  Lying on the floor in front of an open doorway, with your left / right heel and foot lightly touching the wall.  Lying on a bed with both of your feet on the wall or headboard. 2. Without using any effort, allow gravity to slide your foot down the wall slowly until you feel a gentle stretch in the front of your left / right knee, or until your knee reaches the angle that your health care provider tells you. 3. Hold this stretch for __________  seconds. 4. Return your leg to the starting position, using your healthy leg to do the work or to help if needed. Repeat __________ times. Complete this exercise __________ times a day. Exercise B: Knee extension, passive (sitting) 1. Sit with your left / right heel propped up on a chair, a coffee table, or a footstool. Do not have anything under your knee to support it. 2. Allow your leg muscles to relax, letting gravity straighten out your knee. You should feel a stretch behind your left / right knee. 3. If told by your health care provider, deepen the stretch by placing a __________ weight on your thigh, just above your kneecap. 4. Hold this position for __________ seconds. Repeat __________ times. Complete this exercise __________ times a day. Exercise C: Gastrocnemius stretch, passive 1. Sit on the floor with your left / right leg extended. 2. Loop a belt or towel around the ball of your left / right foot. The ball of your foot is on the walking surface, right under your toes. 3. Keep your left / right ankle and foot relaxed and keep your knee straight while you use the belt or towel to pull your foot and ankle toward you. You should feel a gentle stretch in your calf or behind your knee. 4. Hold this position for __________ seconds. Repeat __________ times. Complete this exercise __________ times a day. Strengthening exercises These exercises build strength and endurance in your knee. Endurance is the ability to use your muscles  for a long time, even after they get tired. Exercise D: Quadriceps, isometric 1. Lie on your back with your left / right leg extended and your opposite knee bent. 2. Slowly tense the muscles in the front of your left / right thigh. You should see your kneecap slide up toward your hip or see increased dimpling just above the knee. This motion will push the back of the knee toward the ground. 3. Hold the muscle as tight as you can without increasing your pain for  __________ seconds. 4. Relax the muscles slowly and completely. Repeat __________ times. Complete this exercise __________ times a day. Exercise E: Straight leg raises (quadriceps) 1. Lie on your back with your left / right leg extended and your other knee bent. 2. Tense the muscles in the front of your left / right thigh. 3. Keep these muscles tight as you raise your leg 4-6 inches (10-15 cm) off the floor. Do not let your knee bend. 4. Hold for __________ seconds. 5. Keep these muscles tense as you lower your leg. 6. Relax these muscles slowly and completely. Repeat __________ times. Complete this exercise __________ times a day. Exercise F: Straight leg raises (hip abductors) 1. Lie on your side with your left / right leg in the top position. Lie so your head, shoulder, knee, and hip line up. You may bend your lower knee to help with your balance. 2. Lift your top leg 4-6 inches (10-15 cm), keeping your toes pointed straight ahead. 3. Hold this position for __________ seconds. 4. Slowly lower your leg to the starting position. 5. Allow your muscles to relax completely. Repeat __________ times. Complete this exercise __________ times a day. This information is not intended to replace advice given to you by your health care provider. Make sure you discuss any questions you have with your health care provider. Document Released: 10/03/2015 Document Revised: 10/05/2015 Elsevier Interactive Patient Education  2017 [employer].

## 2015-12-28 ENCOUNTER — Encounter: Payer: Self-pay | Admitting: Family

## 2016-01-03 ENCOUNTER — Other Ambulatory Visit: Payer: Self-pay | Admitting: Internal Medicine

## 2016-01-03 NOTE — Telephone Encounter (Signed)
Patient also called requesting medication refill.

## 2016-01-03 NOTE — Telephone Encounter (Signed)
Sent to pharmacy 

## 2016-01-03 NOTE — Telephone Encounter (Signed)
Please advise in Dr. Crawford's absence.  

## 2016-01-24 ENCOUNTER — Encounter: Payer: Self-pay | Admitting: Nurse Practitioner

## 2016-01-24 ENCOUNTER — Ambulatory Visit (INDEPENDENT_AMBULATORY_CARE_PROVIDER_SITE_OTHER): Payer: BLUE CROSS/BLUE SHIELD | Admitting: Nurse Practitioner

## 2016-01-24 VITALS — BP 154/88 | HR 83 | Temp 98.2°F | Ht 62.0 in | Wt 147.0 lb

## 2016-01-24 DIAGNOSIS — J069 Acute upper respiratory infection, unspecified: Secondary | ICD-10-CM | POA: Diagnosis not present

## 2016-01-24 LAB — POCT INFLUENZA A/B
Influenza A, POC: NEGATIVE
Influenza B, POC: NEGATIVE

## 2016-01-24 MED ORDER — PROMETHAZINE-DM 6.25-15 MG/5ML PO SYRP: 5.0000 mL | ORAL_SOLUTION | Freq: Three times a day (TID) | ORAL | 0 refills | Status: DC | PRN

## 2016-01-24 MED ORDER — GUAIFENESIN ER 600 MG PO TB12: 600.0000 mg | ORAL_TABLET | Freq: Two times a day (BID) | ORAL | 0 refills | Status: DC | PRN

## 2016-01-24 MED ORDER — ALBUTEROL SULFATE HFA 108 (90 BASE) MCG/ACT IN AERS: 2.0000 | INHALATION_SPRAY | Freq: Four times a day (QID) | RESPIRATORY_TRACT | 2 refills | Status: DC | PRN

## 2016-01-24 MED ORDER — FLUTICASONE PROPIONATE 50 MCG/ACT NA SUSP: 2.0000 | Freq: Every day | NASAL | 0 refills | Status: DC

## 2016-01-24 MED ORDER — OXYMETAZOLINE HCL 0.05 % NA SOLN: 1.0000 | Freq: Two times a day (BID) | NASAL | 0 refills | Status: DC

## 2016-01-24 MED ORDER — AZITHROMYCIN 250 MG PO TABS: 250.0000 mg | ORAL_TABLET | Freq: Every day | ORAL | 0 refills | Status: DC

## 2016-01-24 MED ORDER — METHYLPREDNISOLONE ACETATE 40 MG/ML IJ SUSP
40.0000 mg | Freq: Once | INTRAMUSCULAR | Status: AC
  Administered 2016-01-24: 40 mg via INTRAMUSCULAR

## 2016-01-24 NOTE — Progress Notes (Signed)
Pre visit review using our clinic review tool, if applicable. No additional management support is needed unless otherwise documented below in the visit note. 

## 2016-01-24 NOTE — Patient Instructions (Addendum)
[facility] Instructions: Flonase and Afrin use: apply 1spray of afrin in each nare, wait 19mins, then apply 2sprays of flonase in each nare. Use both nasal spray consecutively x 3days, then flonase only for at least 14days.  Encourage adequate oral hydration.  Use over-the-counter  "cold" medicines  such as,  "Mucinex" or" Mucinex DM"  for cough and congestion.  Avoid decongestants if you have high blood pressure. Use" Delsym" or" Robitussin" cough syrup varietis for cough.  You can use plain "Tylenol" or "Advi"l for fever, chills and achyness.   "Common cold" symptoms are usually triggered by a virus.  The antibiotics are usually not necessary. On average, a" viral cold" illness would take 4-7 days to resolve. Please, make an appointment if you are not better or if you're worse.   Start azithromycin if no improvement in 3days.

## 2016-01-24 NOTE — Progress Notes (Signed)
Reviewed with patient in office. See office note

## 2016-01-24 NOTE — Progress Notes (Signed)
Subjective:  Patient ID: Brenda [name], female    DOB: September 18, 1965  Age: 50 y.o. MRN: [id number]  CC: Cough (coughing green mucus,cougestion for 2 days. took mucinex. request out of  work note for 01/23/2016-01/25/2016?)   Cough  This is a new problem. The current episode started in the past 7 days. The problem has been gradually worsening. The problem occurs constantly. The cough is productive of purulent sputum. Associated symptoms include chest pain, chills, a fever, headaches, myalgias, nasal congestion, postnasal drip, rhinorrhea and a sore throat. Pertinent negatives include no wheezing. The symptoms are aggravated by lying down and cold air (tobacco). Risk factors for lung disease include smoking/tobacco exposure. She has tried OTC cough suppressant for the symptoms. The treatment provided no relief. Her past medical history is significant for environmental allergies. There is no history of asthma or bronchitis.    Outpatient Medications Prior to Visit  Medication Sig Dispense Refill  . ALPRAZolam (XANAX) 0.5 MG tablet TAKE 1 TABLET BY MOUTH TWICE A DAY AS NEEDED FOR ANXIETY 60 tablet 0  . amLODipine (NORVASC) 5 MG tablet TAKE ONE TABLET DAILY 30 tablet 5  . Ibuprofen-Famotidine 800-26.6 MG TABS Take 1 tablet by mouth 3 (three) times daily as needed. 90 tablet 1  . pantoprazole (PROTONIX) 40 MG tablet TAKE 1 TABLET (40 MG TOTAL) BY MOUTH DAILY. 30 tablet 3  . pravastatin (PRAVACHOL) 40 MG tablet TAKE ONE TABLET DAILY 30 tablet 5  . sertraline (ZOLOFT) 100 MG tablet Take 2 tablets (200 mg total) by mouth daily. 60 tablet 6  . traMADol (ULTRAM) 50 MG tablet Take 1 tablet (50 mg total) by mouth every 12 (twelve) hours as needed. 45 tablet 2  . triamcinolone cream (KENALOG) 0.1 % Apply 1 application topically 2 (two) times daily. 30 g 0   No facility-administered medications prior to visit.     ROS See HPI  Objective:  BP (!) 154/88   Pulse 83   Temp 98.2 F (36.8 C)   Ht 5\' 2"   (1.575 m)   Wt 147 lb (66.7 kg)   LMP 11/26/2013   SpO2 99%   BMI 26.89 kg/m   BP Readings from Last 3 Encounters:  01/24/16 (!) 154/88  12/26/15 (!) 152/90  10/17/15 (!) 142/60    Wt Readings from Last 3 Encounters:  01/24/16 147 lb (66.7 kg)  12/26/15 146 lb (66.2 kg)  10/17/15 146 lb (66.2 kg)    Physical Exam  Constitutional: She is oriented to person, place, and time.  HENT:  Right Ear: Tympanic membrane, external ear and ear canal normal.  Left Ear: Tympanic membrane, external ear and ear canal normal.  Nose: Mucosal edema and rhinorrhea present. Right sinus exhibits maxillary sinus tenderness and frontal sinus tenderness. Left sinus exhibits maxillary sinus tenderness and frontal sinus tenderness.  Mouth/Throat: Uvula is midline. No trismus in the jaw. Posterior oropharyngeal erythema present. No oropharyngeal exudate.  Eyes: No scleral icterus.  Neck: Normal range of motion. Neck supple.  Cardiovascular: Normal rate and normal heart sounds.   Pulmonary/Chest: Effort normal and breath sounds normal.  Musculoskeletal: She exhibits no edema.  Lymphadenopathy:    She has cervical adenopathy.  Neurological: She is alert and oriented to person, place, and time.  Skin: Skin is warm and dry.  Vitals reviewed.   Lab Results  Component Value Date   WBC 6.0 03/22/2015   HGB 12.7 03/22/2015   HCT 38.3 03/22/2015   PLT 402.0 (H) 03/22/2015   GLUCOSE  122 (H) 03/22/2015   CHOL 160 03/22/2015   TRIG 42.0 03/22/2015   HDL 56.60 03/22/2015   LDLDIRECT 131.3 02/15/2011   LDLCALC 95 03/22/2015   ALT 12 03/22/2015   AST 12 03/22/2015   NA 138 03/22/2015   K 4.0 03/22/2015   CL 103 03/22/2015   CREATININE 0.56 03/22/2015   BUN 10 03/22/2015   CO2 27 03/22/2015   TSH 1.54 10/17/2015   HGBA1C 6.8 (H) 10/17/2015    Dg Knee Complete 4 Views Right  Result Date: 12/26/2015 CLINICAL DATA:  50 y/o F; rule out osteoarthrosis. Generalized pain for 2 months. EXAM: RIGHT KNEE -  COMPLETE 4+ VIEW COMPARISON:  06/21/2012 right knee radiographs. FINDINGS: No evidence of fracture, dislocation, or joint effusion. No evidence of arthropathy or other focal bone abnormality. Superior and inferior patellar enthesophytes. Tiny periarticular osteophytes of the patellofemoral compartment. IMPRESSION: No significant joint space narrowing. Tiny osteophytes of the patellofemoral compartment. Electronically Signed   By: Kristine Garbe M.D.   On: 12/26/2015 16:10    Assessment & Plan:   [name] was seen today for cough.  Diagnoses and all orders for this visit:  Acute URI -     methylPREDNISolone acetate (DEPO-MEDROL) injection 40 mg; Inject 1 mL (40 mg total) into the muscle once. -     guaiFENesin (MUCINEX) 600 MG 12 hr tablet; Take 1 tablet (600 mg total) by mouth 2 (two) times daily as needed for cough or to loosen phlegm. -     promethazine-dextromethorphan (PROMETHAZINE-DM) 6.25-15 MG/5ML syrup; Take 5 mLs by mouth 3 (three) times daily as needed for cough. -     albuterol (PROVENTIL HFA;VENTOLIN HFA) 108 (90 Base) MCG/ACT inhaler; Inhale 2 puffs into the lungs every 6 (six) hours as needed for wheezing or shortness of breath. -     azithromycin (ZITHROMAX Z-PAK) 250 MG tablet; Take 1 tablet (250 mg total) by mouth daily. Take 2tabs on first day, then 1tab once a day till complete -     fluticasone (FLONASE) 50 MCG/ACT nasal spray; Place 2 sprays into both nostrils daily. -     oxymetazoline (AFRIN NASAL SPRAY) 0.05 % nasal spray; Place 1 spray into both nostrils 2 (two) times daily. Use only for 3days, then stop -     POCT Influenza A/B   I am having [name] start on guaiFENesin, promethazine-dextromethorphan, albuterol, azithromycin, fluticasone, and oxymetazoline. I am also having her maintain her sertraline, traMADol, pravastatin, amLODipine, pantoprazole, triamcinolone cream, Ibuprofen-Famotidine, and ALPRAZolam. We will continue to administer methylPREDNISolone  acetate.  Meds ordered this encounter  Medications  . methylPREDNISolone acetate (DEPO-MEDROL) injection 40 mg  . guaiFENesin (MUCINEX) 600 MG 12 hr tablet    Sig: Take 1 tablet (600 mg total) by mouth 2 (two) times daily as needed for cough or to loosen phlegm.    Dispense:  14 tablet    Refill:  0    Order Specific Question:   Supervising Provider    Answer:   Cassandria Anger [[id number]]  . promethazine-dextromethorphan (PROMETHAZINE-DM) 6.25-15 MG/5ML syrup    Sig: Take 5 mLs by mouth 3 (three) times daily as needed for cough.    Dispense:  240 mL    Refill:  0    Order Specific Question:   Supervising Provider    Answer:   Cassandria Anger [[id number]]  . albuterol (PROVENTIL HFA;VENTOLIN HFA) 108 (90 Base) MCG/ACT inhaler    Sig: Inhale 2 puffs into the lungs every 6 (six) hours  as needed for wheezing or shortness of breath.    Dispense:  1 Inhaler    Refill:  2    Order Specific Question:   Supervising Provider    Answer:   Cassandria Anger [[id number]]  . azithromycin (ZITHROMAX Z-PAK) 250 MG tablet    Sig: Take 1 tablet (250 mg total) by mouth daily. Take 2tabs on first day, then 1tab once a day till complete    Dispense:  6 tablet    Refill:  0    Order Specific Question:   Supervising Provider    Answer:   Cassandria Anger [[id number]]  . fluticasone (FLONASE) 50 MCG/ACT nasal spray    Sig: Place 2 sprays into both nostrils daily.    Dispense:  16 g    Refill:  0    Order Specific Question:   Supervising Provider    Answer:   Cassandria Anger [[id number]]  . oxymetazoline (AFRIN NASAL SPRAY) 0.05 % nasal spray    Sig: Place 1 spray into both nostrils 2 (two) times daily. Use only for 3days, then stop    Dispense:  30 mL    Refill:  0    Order Specific Question:   Supervising Provider    Answer:   Cassandria Anger [[id number]]    Follow-up: No Follow-up on file.  Wilfred Lacy, NP

## 2016-01-26 ENCOUNTER — Telehealth: Payer: Self-pay | Admitting: Nurse Practitioner

## 2016-01-26 ENCOUNTER — Encounter: Payer: Self-pay | Admitting: Nurse Practitioner

## 2016-01-26 NOTE — Telephone Encounter (Signed)
Patient states Baldo Ash had written her out of work until today.  Patient states she still was not feeling good and had to stay home today.  Patient would like to know if Baldo Ash could write her out of work for today too.

## 2016-01-26 NOTE — Telephone Encounter (Signed)
[employer] for 1 more day out of work note. Need to inform pt to come pick it up. Will have letter ready up front.

## 2016-01-26 NOTE — Telephone Encounter (Signed)
Patient will come pick up. 

## 2016-01-31 ENCOUNTER — Telehealth: Payer: Self-pay | Admitting: *Deleted

## 2016-01-31 NOTE — Telephone Encounter (Signed)
Azithromycin was prescribed to be started on Saturday. If she followed my instructions, then she will be finishing course of medication today. Azithromycin is effective in the system for up to 1week after completion. Hence I do not think another oral abx needed at this time. If she is not better in 1week from today or develops any new symptoms; she will need to return to office to be re evaluated. Thank you

## 2016-01-31 NOTE — Telephone Encounter (Signed)
Pt left msg on triage stating she saw Baldo Ash week ago she completed the zpac that was given, but she is still congested, constantly coughing. Don't think zpac worked requesting a stronger antibiotic or recommendation for sxs...Johny Chess

## 2016-02-01 NOTE — Telephone Encounter (Signed)
Called pt no answer LMOM w/[employer] response.../lmb 

## 2016-02-08 ENCOUNTER — Other Ambulatory Visit: Payer: Self-pay | Admitting: Internal Medicine

## 2016-02-23 ENCOUNTER — Other Ambulatory Visit: Payer: Self-pay | Admitting: Internal Medicine

## 2016-03-08 ENCOUNTER — Other Ambulatory Visit: Payer: Self-pay | Admitting: Internal Medicine

## 2016-03-11 ENCOUNTER — Other Ambulatory Visit: Payer: Self-pay | Admitting: Internal Medicine

## 2016-03-11 NOTE — Telephone Encounter (Signed)
Pt left msg on triage requesting refills on her alprazolam,

## 2016-03-12 MED ORDER — ALPRAZOLAM 0.5 MG PO TABS: ORAL_TABLET | ORAL | 1 refills | Status: DC

## 2016-03-12 MED ORDER — SERTRALINE HCL 100 MG PO TABS: 200.0000 mg | ORAL_TABLET | Freq: Every day | ORAL | 5 refills | Status: DC

## 2016-03-12 NOTE — Telephone Encounter (Signed)
Notified pt MD ok refill rx faxed to [facility] pt also states she need refills on her sertraline. Inform will send to [place].Marland KitchenJohny Chess

## 2016-04-10 ENCOUNTER — Other Ambulatory Visit: Payer: Self-pay | Admitting: Nurse Practitioner

## 2016-04-10 DIAGNOSIS — J069 Acute upper respiratory infection, unspecified: Secondary | ICD-10-CM

## 2016-04-10 NOTE — Telephone Encounter (Signed)
rx sent, if not better need to come in and see PCP.

## 2016-04-11 ENCOUNTER — Ambulatory Visit (INDEPENDENT_AMBULATORY_CARE_PROVIDER_SITE_OTHER): Payer: BLUE CROSS/BLUE SHIELD | Admitting: Internal Medicine

## 2016-04-11 ENCOUNTER — Encounter: Payer: Self-pay | Admitting: Internal Medicine

## 2016-04-11 DIAGNOSIS — J069 Acute upper respiratory infection, unspecified: Secondary | ICD-10-CM

## 2016-04-11 DIAGNOSIS — B9789 Other viral agents as the cause of diseases classified elsewhere: Secondary | ICD-10-CM | POA: Diagnosis not present

## 2016-04-11 MED ORDER — PROMETHAZINE-DM 6.25-15 MG/5ML PO SYRP: 5.0000 mL | ORAL_SOLUTION | Freq: Three times a day (TID) | ORAL | 0 refills | Status: DC | PRN

## 2016-04-11 MED ORDER — IBUPROFEN 600 MG PO TABS: 600.0000 mg | ORAL_TABLET | Freq: Three times a day (TID) | ORAL | 0 refills | Status: DC | PRN

## 2016-04-11 MED ORDER — FLUTICASONE PROPIONATE 50 MCG/ACT NA SUSP: 2.0000 | Freq: Every day | NASAL | 3 refills | Status: DC

## 2016-04-11 MED ORDER — ALBUTEROL SULFATE HFA 108 (90 BASE) MCG/ACT IN AERS: 2.0000 | INHALATION_SPRAY | Freq: Four times a day (QID) | RESPIRATORY_TRACT | 2 refills | Status: DC | PRN

## 2016-04-11 NOTE — Progress Notes (Signed)
Pre visit review using our clinic review tool, if applicable. No additional management support is needed unless otherwise documented below in the visit note. 

## 2016-04-11 NOTE — Patient Instructions (Signed)
We have sent in the albuterol for the breathing. We have sent in the cough medicine for cough.  We have sent in the flonase for the drainage. Use 2 sprays in each nostril once a day.   You should be getting better in the next 4-5 days. If not call us back.

## 2016-04-11 NOTE — Progress Notes (Signed)
   Subjective:    Patient ID: Brenda [name], female    DOB: Sep 15, 1965, 51 y.o.   MRN: [id number]  HPI The patient is a 51 YO female coming in for cough and congestion for about 3 days. She is having sinus pressure and drainage which led to a sore throat. She then got some mild coughing. Not productive. No fevers or chills. She has been around sick contacts at work. Overall symptoms gradually worsening. She has tried mucinex which was not effective. No SOB but she feels like she cannot breathe well in a coughing fit. No headaches or muscle aches.   Review of Systems  Constitutional: Positive for activity change, appetite change and fatigue. Negative for chills, fever and unexpected weight change.  HENT: Positive for congestion, postnasal drip, rhinorrhea, sinus pressure and sore throat. Negative for ear discharge, ear pain, sinus pain and trouble swallowing.   Eyes: Negative.   Respiratory: Positive for cough. Negative for chest tightness, shortness of breath and wheezing.   Cardiovascular: Negative.   Gastrointestinal: Negative.   Musculoskeletal: Negative.   Neurological: Negative.       Objective:   Physical Exam  Constitutional: She is oriented to person, place, and time. She appears well-developed and well-nourished.  HENT:  Head: Normocephalic and atraumatic.  Right Ear: External ear normal.  Left Ear: External ear normal.  Oropharynx with redness and clear drainage, no nasal crusting or sinus pressure on exam  Eyes: EOM are normal.  Neck: Normal range of motion. No JVD present.  Cardiovascular: Normal rate and regular rhythm.   Pulmonary/Chest: Effort normal and breath sounds normal. No respiratory distress. She has no wheezes. She has no rales. She exhibits no tenderness.  Abdominal: Soft.  Lymphadenopathy:    She has no cervical adenopathy.  Neurological: She is alert and oriented to person, place, and time.  Skin: Skin is warm and dry.   Vitals:   04/11/16 1608  BP:  (!) 142/82  Pulse: 80  Temp: 98.6 F (37 C)  TempSrc: Oral  SpO2: 99%  Weight: 151 lb (68.5 kg)  Height: 5\' 2"  (1.575 m)      Assessment & Plan:

## 2016-04-12 DIAGNOSIS — J069 Acute upper respiratory infection, unspecified: Secondary | ICD-10-CM | POA: Insufficient documentation

## 2016-04-12 NOTE — Assessment & Plan Note (Signed)
Refill albuterol for cough, rx for promethazine cough syrup, flonase for congestion. No indication for steroids or antibiotics at this time.

## 2016-04-15 ENCOUNTER — Ambulatory Visit: Payer: BLUE CROSS/BLUE SHIELD | Admitting: Internal Medicine

## 2016-04-15 DIAGNOSIS — Z0289 Encounter for other administrative examinations: Secondary | ICD-10-CM

## 2016-04-23 ENCOUNTER — Other Ambulatory Visit: Payer: Self-pay | Admitting: Internal Medicine

## 2016-04-23 ENCOUNTER — Other Ambulatory Visit: Payer: Self-pay | Admitting: Nurse Practitioner

## 2016-04-23 DIAGNOSIS — J069 Acute upper respiratory infection, unspecified: Secondary | ICD-10-CM

## 2016-04-23 MED ORDER — TRIAMCINOLONE ACETONIDE 0.1 % EX CREA: 1.0000 "application " | TOPICAL_CREAM | Freq: Two times a day (BID) | CUTANEOUS | 3 refills | Status: DC

## 2016-04-23 MED ORDER — PROMETHAZINE-DM 6.25-15 MG/5ML PO SYRP: 5.0000 mL | ORAL_SOLUTION | Freq: Three times a day (TID) | ORAL | 0 refills | Status: DC | PRN

## 2016-06-19 ENCOUNTER — Other Ambulatory Visit: Payer: Self-pay | Admitting: Internal Medicine

## 2016-07-05 ENCOUNTER — Other Ambulatory Visit: Payer: Self-pay | Admitting: Family

## 2016-07-05 DIAGNOSIS — M25561 Pain in right knee: Principal | ICD-10-CM

## 2016-07-05 DIAGNOSIS — G8929 Other chronic pain: Secondary | ICD-10-CM

## 2016-07-15 ENCOUNTER — Ambulatory Visit (INDEPENDENT_AMBULATORY_CARE_PROVIDER_SITE_OTHER): Payer: BLUE CROSS/BLUE SHIELD | Admitting: Internal Medicine

## 2016-07-15 ENCOUNTER — Other Ambulatory Visit (INDEPENDENT_AMBULATORY_CARE_PROVIDER_SITE_OTHER): Payer: BLUE CROSS/BLUE SHIELD

## 2016-07-15 ENCOUNTER — Encounter: Payer: Self-pay | Admitting: Internal Medicine

## 2016-07-15 VITALS — BP 150/90 | HR 89 | Temp 98.0°F | Resp 12 | Ht 62.0 in | Wt 150.0 lb

## 2016-07-15 DIAGNOSIS — I10 Essential (primary) hypertension: Secondary | ICD-10-CM

## 2016-07-15 DIAGNOSIS — Z0000 Encounter for general adult medical examination without abnormal findings: Secondary | ICD-10-CM

## 2016-07-15 DIAGNOSIS — K219 Gastro-esophageal reflux disease without esophagitis: Secondary | ICD-10-CM | POA: Diagnosis not present

## 2016-07-15 DIAGNOSIS — Z1231 Encounter for screening mammogram for malignant neoplasm of breast: Secondary | ICD-10-CM | POA: Diagnosis not present

## 2016-07-15 DIAGNOSIS — E119 Type 2 diabetes mellitus without complications: Secondary | ICD-10-CM | POA: Diagnosis not present

## 2016-07-15 DIAGNOSIS — Z1239 Encounter for other screening for malignant neoplasm of breast: Secondary | ICD-10-CM

## 2016-07-15 DIAGNOSIS — F411 Generalized anxiety disorder: Secondary | ICD-10-CM

## 2016-07-15 DIAGNOSIS — E7800 Pure hypercholesterolemia, unspecified: Secondary | ICD-10-CM | POA: Diagnosis not present

## 2016-07-15 LAB — COMPREHENSIVE METABOLIC PANEL
ALBUMIN: 4.6 g/dL (ref 3.5–5.2)
ALK PHOS: 71 U/L (ref 39–117)
ALT: 12 U/L (ref 0–35)
AST: 11 U/L (ref 0–37)
BUN: 12 mg/dL (ref 6–23)
CO2: 29 mEq/L (ref 19–32)
Calcium: 9.7 mg/dL (ref 8.4–10.5)
Chloride: 100 mEq/L (ref 96–112)
Creatinine, Ser: 0.56 mg/dL (ref 0.40–1.20)
GFR: 145.65 mL/min (ref >60.00)
Glucose, Bld: 251 mg/dL — ABNORMAL HIGH (ref 70–99)
POTASSIUM: 4.3 meq/L (ref 3.5–5.1)
SODIUM: 137 meq/L (ref 135–145)
TOTAL PROTEIN: 7.7 g/dL (ref 6.0–8.3)
Total Bilirubin: 0.3 mg/dL (ref 0.2–1.2)

## 2016-07-15 LAB — LIPID PANEL
CHOLESTEROL: 194 mg/dL (ref 0–200)
HDL: 56.6 mg/dL (ref >39.00)
LDL Cholesterol: 122 mg/dL — ABNORMAL HIGH (ref 0–99)
NONHDL: 137.03
Total CHOL/HDL Ratio: 3
Triglycerides: 75 mg/dL (ref 0.0–149.0)
VLDL: 15 mg/dL (ref 0.0–40.0)

## 2016-07-15 LAB — HEMOGLOBIN A1C: Hgb A1c MFr Bld: 9.2 % — ABNORMAL HIGH (ref 4.6–6.5)

## 2016-07-15 LAB — CBC
HEMATOCRIT: 37.4 % (ref 36.0–46.0)
HEMOGLOBIN: 12.3 g/dL (ref 12.0–15.0)
MCHC: 32.9 g/dL (ref 30.0–36.0)
MCV: 78 fl (ref 78.0–100.0)
PLATELETS: 362 10*3/uL (ref 150.0–400.0)
RBC: 4.79 Mil/uL (ref 3.87–5.11)
RDW: 13.4 % (ref 11.5–15.5)
WBC: 7.2 10*3/uL (ref 4.0–10.5)

## 2016-07-15 MED ORDER — ESOMEPRAZOLE MAGNESIUM 40 MG PO CPDR: 40.0000 mg | DELAYED_RELEASE_CAPSULE | Freq: Every day | ORAL | 3 refills | Status: DC

## 2016-07-15 MED ORDER — BUSPIRONE HCL 10 MG PO TABS: 10.0000 mg | ORAL_TABLET | Freq: Two times a day (BID) | ORAL | 2 refills | Status: DC

## 2016-07-15 MED ORDER — ESTRADIOL 0.05 MG/24HR TD PTTW: 1.0000 | MEDICATED_PATCH | TRANSDERMAL | 12 refills | Status: DC

## 2016-07-15 NOTE — Progress Notes (Signed)
   Subjective:    Patient ID: Brenda [name], female    DOB: 1965-02-27, 51 y.o.   MRN: [id number]  HPI The patient is a 51 YO female coming in for wellness with several concerns.   PMH, [facility], social history reviewed and updated.   Review of Systems  Constitutional: Negative.   HENT: Negative.   Eyes: Negative.   Respiratory: Negative for cough, chest tightness and shortness of breath.   Cardiovascular: Negative for chest pain, palpitations and leg swelling.  Gastrointestinal: Negative for abdominal distention, abdominal pain, constipation, diarrhea, nausea and vomiting.  Endocrine: Positive for heat intolerance.  Musculoskeletal: Negative.   Skin: Negative.   Neurological: Negative.   Psychiatric/Behavioral: Positive for decreased concentration and dysphoric mood. Negative for behavioral problems, hallucinations, self-injury, sleep disturbance and suicidal ideas. The patient is nervous/anxious. The patient is not hyperactive.       Objective:   Physical Exam  Constitutional: She is oriented to person, place, and time. She appears well-developed and well-nourished.  HENT:  Head: Normocephalic and atraumatic.  Eyes: EOM are normal.  Neck: Normal range of motion.  Cardiovascular: Normal rate and regular rhythm.   Pulmonary/Chest: Effort normal and breath sounds normal. No respiratory distress. She has no wheezes. She has no rales.  Abdominal: Soft. Bowel sounds are normal. She exhibits no distension. There is no tenderness. There is no rebound.  Musculoskeletal: She exhibits no edema.  Neurological: She is alert and oriented to person, place, and time. Coordination normal.  Skin: Skin is warm and dry.  Psychiatric: She has a normal mood and affect.  Tearful at appropriate moments.    Vitals:   07/15/16 1012  BP: (!) 150/90  Pulse: 89  Resp: 12  Temp: 98 F (36.7 C)  TempSrc: Oral  SpO2: 99%  Weight: 150 lb (68 kg)  Height: 5\' 2"  (1.575 m)      Assessment & Plan:

## 2016-07-15 NOTE — Patient Instructions (Signed)
We are checking the labs today and will call you back with the results.   Think about doing some counseling to help with stress management. Work on exercising 3 times per week to help burn some of the stress hormones as well.   We have sent in buspar to replace the xanax to help more with the anxiety which you can take twice a day as needed.   We have sent in nexium to replace the protonix (pantoprazole).   We have sent in the estrogen patch to help with the hot flash symptoms. Change the patch twice a week and give it 2-3 weeks to work.   Health Maintenance, Female Adopting a healthy lifestyle and getting preventive care can go a long way to promote health and wellness. Talk with your health care provider about what schedule of regular examinations is right for you. This is a good chance for you to check in with your provider about disease prevention and staying healthy. In between checkups, there are plenty of things you can do on your own. Experts have done a lot of research about which lifestyle changes and preventive measures are most likely to keep you healthy. Ask your health care provider for more information. Weight and diet Eat a healthy diet  Be sure to include plenty of vegetables, fruits, low-fat dairy products, and lean protein.  Do not eat a lot of foods high in solid fats, added sugars, or salt.  Get regular exercise. This is one of the most important things you can do for your health. ? Most adults should exercise for at least 150 minutes each week. The exercise should increase your heart rate and make you sweat (moderate-intensity exercise). ? Most adults should also do strengthening exercises at least twice a week. This is in addition to the moderate-intensity exercise.  Maintain a healthy weight  Body mass index (BMI) is a measurement that can be used to identify possible weight problems. It estimates body fat based on height and weight. Your health care provider can  help determine your BMI and help you achieve or maintain a healthy weight.  For females 62 years of age and older: ? A BMI below 18.5 is considered underweight. ? A BMI of 18.5 to 24.9 is normal. ? A BMI of 25 to 29.9 is considered overweight. ? A BMI of 30 and above is considered obese.  Watch levels of cholesterol and blood lipids  You should start having your blood tested for lipids and cholesterol at 53 years of age, then have this test every 5 years.  You may need to have your cholesterol levels checked more often if: ? Your lipid or cholesterol levels are high. ? You are older than 53 years of age. ? You are at high risk for heart disease.  Cancer screening Lung Cancer  Lung cancer screening is recommended for adults 43-21 years old who are at high risk for lung cancer because of a history of smoking.  A yearly low-dose CT scan of the lungs is recommended for people who: ? Currently smoke. ? Have quit within the past 15 years. ? Have at least a 30-pack-year history of smoking. A pack year is smoking an average of one pack of cigarettes a day for 1 year.  Yearly screening should continue until it has been 15 years since you quit.  Yearly screening should stop if you develop a health problem that would prevent you from having lung cancer treatment.  Breast Cancer  Practice  breast self-awareness. This means understanding how your breasts normally appear and feel.  It also means doing regular breast self-exams. Let your health care provider know about any changes, no matter how small.  If you are in your 20s or 30s, you should have a clinical breast exam (CBE) by a health care provider every 1-3 years as part of a regular health exam.  If you are 3 or older, have a CBE every year. Also consider having a breast X-ray (mammogram) every year.  If you have a family history of breast cancer, talk to your health care provider about genetic screening.  If you are at high risk  for breast cancer, talk to your health care provider about having an MRI and a mammogram every year.  Breast cancer gene (BRCA) assessment is recommended for women who have family members with BRCA-related cancers. BRCA-related cancers include: ? Breast. ? Ovarian. ? Tubal. ? Peritoneal cancers.  Results of the assessment will determine the need for genetic counseling and BRCA1 and BRCA2 testing.  Cervical Cancer Your health care provider may recommend that you be screened regularly for cancer of the pelvic organs (ovaries, uterus, and vagina). This screening involves a pelvic examination, including checking for microscopic changes to the surface of your cervix (Pap test). You may be encouraged to have this screening done every 3 years, beginning at age 40.  For women ages 60-65, health care providers may recommend pelvic exams and Pap testing every 3 years, or they may recommend the Pap and pelvic exam, combined with testing for human papilloma virus (HPV), every 5 years. Some types of HPV increase your risk of cervical cancer. Testing for HPV may also be done on women of any age with unclear Pap test results.  Other health care providers may not recommend any screening for nonpregnant women who are considered low risk for pelvic cancer and who do not have symptoms. Ask your health care provider if a screening pelvic exam is right for you.  If you have had past treatment for cervical cancer or a condition that could lead to cancer, you need Pap tests and screening for cancer for at least 20 years after your treatment. If Pap tests have been discontinued, your risk factors (such as having a new sexual partner) need to be reassessed to determine if screening should resume. Some women have medical problems that increase the chance of getting cervical cancer. In these cases, your health care provider may recommend more frequent screening and Pap tests.  Colorectal Cancer  This type of cancer can be  detected and often prevented.  Routine colorectal cancer screening usually begins at 53 years of age and continues through 53 years of age.  Your health care provider may recommend screening at an earlier age if you have risk factors for colon cancer.  Your health care provider may also recommend using home test kits to check for hidden blood in the stool.  A small camera at the end of a tube can be used to examine your colon directly (sigmoidoscopy or colonoscopy). This is done to check for the earliest forms of colorectal cancer.  Routine screening usually begins at age 4.  Direct examination of the colon should be repeated every 5-10 years through 53 years of age. However, you may need to be screened more often if early forms of precancerous polyps or small growths are found.  Skin Cancer  Check your skin from head to toe regularly.  Tell your health care provider  about any new moles or changes in moles, especially if there is a change in a mole's shape or color.  Also tell your health care provider if you have a mole that is larger than the size of a pencil eraser.  Always use sunscreen. Apply sunscreen liberally and repeatedly throughout the day.  Protect yourself by wearing long sleeves, pants, a wide-brimmed hat, and sunglasses whenever you are outside.  Heart disease, diabetes, and high blood pressure  High blood pressure causes heart disease and increases the risk of stroke. High blood pressure is more likely to develop in: ? People who have blood pressure in the high end of the normal range (130-139/85-89 mm Hg). ? People who are overweight or obese. ? People who are African American.  If you are 85-68 years of age, have your blood pressure checked every 3-5 years. If you are 38 years of age or older, have your blood pressure checked every year. You should have your blood pressure measured twice-once when you are at a hospital or clinic, and once when you are not at a  hospital or clinic. Record the average of the two measurements. To check your blood pressure when you are not at a hospital or clinic, you can use: ? An automated blood pressure machine at a pharmacy. ? A home blood pressure monitor.  If you are between 50 years and 24 years old, ask your health care provider if you should take aspirin to prevent strokes.  Have regular diabetes screenings. This involves taking a blood sample to check your fasting blood sugar level. ? If you are at a normal weight and have a low risk for diabetes, have this test once every three years after 53 years of age. ? If you are overweight and have a high risk for diabetes, consider being tested at a younger age or more often. Preventing infection Hepatitis B  If you have a higher risk for hepatitis B, you should be screened for this virus. You are considered at high risk for hepatitis B if: ? You were born in a country where hepatitis B is common. Ask your health care provider which countries are considered high risk. ? Your parents were born in a high-risk country, and you have not been immunized against hepatitis B (hepatitis B vaccine). ? You have HIV or AIDS. ? You use needles to inject street drugs. ? You live with someone who has hepatitis B. ? You have had sex with someone who has hepatitis B. ? You get hemodialysis treatment. ? You take certain medicines for conditions, including cancer, organ transplantation, and autoimmune conditions.  Hepatitis C  Blood testing is recommended for: ? Everyone born from [birth year] through 1967. ? Anyone with known risk factors for hepatitis C.  Sexually transmitted infections (STIs)  You should be screened for sexually transmitted infections (STIs) including gonorrhea and chlamydia if: ? You are sexually active and are younger than 53 years of age. ? You are older than 53 years of age and your health care provider tells you that you are at risk for this type of  infection. ? Your sexual activity has changed since you were last screened and you are at an increased risk for chlamydia or gonorrhea. Ask your health care provider if you are at risk.  If you do not have HIV, but are at risk, it may be recommended that you take a prescription medicine daily to prevent HIV infection. This is called pre-exposure prophylaxis (PrEP). You are  considered at risk if: ? You are sexually active and do not regularly use condoms or know the HIV status of your partner(s). ? You take drugs by injection. ? You are sexually active with a partner who has HIV.  Talk with your health care provider about whether you are at high risk of being infected with HIV. If you choose to begin PrEP, you should first be tested for HIV. You should then be tested every 3 months for as long as you are taking PrEP. Pregnancy  If you are premenopausal and you may become pregnant, ask your health care provider about preconception counseling.  If you may become pregnant, take 400 to 800 micrograms (mcg) of folic acid every day.  If you want to prevent pregnancy, talk to your health care provider about birth control (contraception). Osteoporosis and menopause  Osteoporosis is a disease in which the bones lose minerals and strength with aging. This can result in serious bone fractures. Your risk for osteoporosis can be identified using a bone density scan.  If you are 38 years of age or older, or if you are at risk for osteoporosis and fractures, ask your health care provider if you should be screened.  Ask your health care provider whether you should take a calcium or vitamin D supplement to lower your risk for osteoporosis.  Menopause may have certain physical symptoms and risks.  Hormone replacement therapy may reduce some of these symptoms and risks. Talk to your health care provider about whether hormone replacement therapy is right for you. Follow these instructions at home:  Schedule  regular health, dental, and eye exams.  Stay current with your immunizations.  Do not use any tobacco products including cigarettes, chewing tobacco, or electronic cigarettes.  If you are pregnant, do not drink alcohol.  If you are breastfeeding, limit how much and how often you drink alcohol.  Limit alcohol intake to no more than 1 drink per day for nonpregnant women. One drink equals 12 ounces of beer, 5 ounces of wine, or 1 ounces of hard liquor.  Do not use street drugs.  Do not share needles.  Ask your health care provider for help if you need support or information about quitting drugs.  Tell your health care provider if you often feel depressed.  Tell your health care provider if you have ever been abused or do not feel safe at home. This information is not intended to replace advice given to you by your health care provider. Make sure you discuss any questions you have with your health care provider. Document Released: 08/13/2010 Document Revised: 07/06/2015 Document Reviewed: 11/01/2014 Elsevier Interactive Patient Education  [employer].

## 2016-07-16 DIAGNOSIS — Z0000 Encounter for general adult medical examination without abnormal findings: Secondary | ICD-10-CM | POA: Insufficient documentation

## 2016-07-16 NOTE — Assessment & Plan Note (Signed)
A little bit worse and xanax is not helping well. She is still taking zoloft and does not want change today. Will switch xanax with buspar to see if this helps more. Asked her to try counseling and she will do that.

## 2016-07-16 NOTE — Assessment & Plan Note (Signed)
BP at goal on her amlodipine. If she needs titration will add ACE-I due to concurrent diabetes newly diagnosed.

## 2016-07-16 NOTE — Assessment & Plan Note (Signed)
Foot exam done today, declines pneumonia vaccine. Checking HgA1c, lipid panel. Controlled with diet at this time, monitor and adjust as needed. On statin.

## 2016-07-16 NOTE — Assessment & Plan Note (Signed)
Ordered mammogram and checking labs. Counseled about pap smear and she is not sure she wants to do that now. Colonoscopy due in 2020. Counseled about shingrix. Given screening recommendations.

## 2016-07-16 NOTE — Assessment & Plan Note (Signed)
Taking pravastatin and checking lipid panel. Adjust for goal <100 LDL.

## 2016-07-16 NOTE — Assessment & Plan Note (Signed)
Change to nexium from protonix due to sub-optimal control. If no improvement needs referral to GI.

## 2016-07-18 ENCOUNTER — Telehealth: Payer: Self-pay | Admitting: Internal Medicine

## 2016-07-18 ENCOUNTER — Other Ambulatory Visit: Payer: Self-pay | Admitting: Internal Medicine

## 2016-07-18 NOTE — Telephone Encounter (Signed)
Pt would like to go back on Alprazolam. Please advise.

## 2016-07-19 NOTE — Telephone Encounter (Signed)
Cannot be done outside visit as this is controlled.

## 2016-07-22 ENCOUNTER — Ambulatory Visit: Payer: BLUE CROSS/BLUE SHIELD | Admitting: Obstetrics

## 2016-07-30 ENCOUNTER — Other Ambulatory Visit: Payer: Self-pay

## 2016-07-30 ENCOUNTER — Telehealth: Payer: Self-pay | Admitting: Internal Medicine

## 2016-07-30 MED ORDER — ESTRADIOL 0.05 MG/24HR TD PTTW: 1.0000 | MEDICATED_PATCH | TRANSDERMAL | 12 refills | Status: DC

## 2016-07-30 MED ORDER — ESOMEPRAZOLE MAGNESIUM 40 MG PO CPDR: 40.0000 mg | DELAYED_RELEASE_CAPSULE | Freq: Every day | ORAL | 3 refills | Status: DC

## 2016-07-30 MED ORDER — BUSPIRONE HCL 10 MG PO TABS: 10.0000 mg | ORAL_TABLET | Freq: Two times a day (BID) | ORAL | 2 refills | Status: DC

## 2016-07-30 NOTE — Telephone Encounter (Signed)
Pt is faxing over FL2 forms, she has been out of work today and yesterday taking care of her mother who is sick, she wanted me to make sure the forms were written for her out of work yesterday 07/29/16 and today so she does not get points at her job

## 2016-07-30 NOTE — Telephone Encounter (Signed)
Called to speak with patient about what she is needing. She needs FMLA. She will be dropping off them forms tomorrow 6/20. Patient was informed Dr. Sharlet Salina is out of office and may need to set up an appointment to get these forms filled out.

## 2016-07-30 NOTE — Telephone Encounter (Signed)
Sent to [facility]

## 2016-07-30 NOTE — Telephone Encounter (Signed)
Pt asked if the medications that were sent on 07/19/2016 (Buspar, Nexium and Vivelle-Dot) could be sent to [facility]. They were sent to [facility] and this is not a pharmacy that she has ever used before.

## 2016-08-02 NOTE — Telephone Encounter (Signed)
Informed patient that Wilfred Lacy would fill out the forms for the FMLA. Patient states she would be dropping off the paperwork. She has yet to drop it off.

## 2016-08-09 NOTE — Telephone Encounter (Signed)
LVM for patient to see if she is going to be dropping papers off. We have yet to received any papers.

## 2016-08-15 ENCOUNTER — Telehealth: Payer: Self-pay | Admitting: Internal Medicine

## 2016-08-15 NOTE — Telephone Encounter (Signed)
Pt called stating that the Buspar prescription that was prescribed does not seem to be helping. She asked if she could go back on the Xanax? Please advise.

## 2016-08-15 NOTE — Telephone Encounter (Signed)
Patient would need visit

## 2016-08-16 NOTE — Telephone Encounter (Signed)
Pt informed and appt made.

## 2016-08-20 ENCOUNTER — Ambulatory Visit: Payer: BLUE CROSS/BLUE SHIELD | Admitting: Nurse Practitioner

## 2016-08-26 ENCOUNTER — Encounter: Payer: Self-pay | Admitting: Nurse Practitioner

## 2016-08-26 ENCOUNTER — Ambulatory Visit: Payer: BLUE CROSS/BLUE SHIELD | Admitting: Obstetrics

## 2016-08-26 ENCOUNTER — Ambulatory Visit (INDEPENDENT_AMBULATORY_CARE_PROVIDER_SITE_OTHER): Payer: BLUE CROSS/BLUE SHIELD | Admitting: Nurse Practitioner

## 2016-08-26 VITALS — BP 136/78 | HR 75 | Temp 98.0°F | Ht 62.0 in | Wt 147.0 lb

## 2016-08-26 DIAGNOSIS — F329 Major depressive disorder, single episode, unspecified: Secondary | ICD-10-CM

## 2016-08-26 DIAGNOSIS — F32A Depression, unspecified: Secondary | ICD-10-CM

## 2016-08-26 DIAGNOSIS — F411 Generalized anxiety disorder: Secondary | ICD-10-CM

## 2016-08-26 MED ORDER — CLONAZEPAM 0.5 MG PO TABS: 0.5000 mg | ORAL_TABLET | Freq: Every day | ORAL | 0 refills | Status: DC

## 2016-08-26 NOTE — Progress Notes (Signed)
Subjective:  Patient ID: Brenda [name], female    DOB: 16-Mar-1965  Age: 51 y.o. MRN: [id number]  CC: Medication Problem (medication consult--xanax is not working,req to go back on xanax or clonipin? FMLA paper work (she is taking of her mother) alot going on right now)   HPI Anxiety: Thought racing at bedtime . Feeling anxious all the time. 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 No ETOH use, no substance abuse. buspar and zoloft not helping at this time  Will like ref for counseling. She will like to try klonopin.  Reviewed controlled substance database, no red flags noted.  Outpatient Medications Prior to Visit  Medication Sig Dispense Refill  . albuterol (PROVENTIL HFA;VENTOLIN HFA) 108 (90 Base) MCG/ACT inhaler Inhale 2 puffs into the lungs every 6 (six) hours as needed for wheezing or shortness of breath. 1 Inhaler 2  . amLODipine (NORVASC) 5 MG tablet TAKE ONE TABLET DAILY 90 tablet 1  . esomeprazole (NEXIUM) 40 MG capsule Take 1 capsule (40 mg total) by mouth daily. 30 capsule 3  . estradiol (VIVELLE-DOT) 0.05 MG/24HR patch Place 1 patch (0.05 mg total) onto the skin 2 (two) times a week. 8 patch 12  . fluticasone (FLONASE) 50 MCG/ACT nasal spray Place 2 sprays into both nostrils daily. 16 g 3  . ibuprofen (ADVIL,MOTRIN) 600 MG tablet TAKE 1 TABLET (600 MG TOTAL) BY MOUTH EVERY 8 (EIGHT) HOURS AS NEEDED. 90 tablet 0  . Ibuprofen-Famotidine 800-26.6 MG TABS Take 1 tablet by mouth 3 (three) times daily as needed. 90 tablet 1  . pravastatin (PRAVACHOL) 40 MG tablet TAKE ONE TABLET DAILY 90 tablet 1  . promethazine-dextromethorphan (PROMETHAZINE-DM) 6.25-15 MG/5ML syrup Take 5 mLs by mouth 3 (three) times daily as needed for cough. 100 mL 0  . sertraline (ZOLOFT) 100 MG tablet Take 2 tablets (200 mg total) by mouth daily. 60 tablet 5  . triamcinolone cream (KENALOG) 0.1 % Apply 1 application topically 2 (two) times daily. 30 g 3  . busPIRone (BUSPAR) 10 MG  tablet Take 1 tablet (10 mg total) by mouth 2 (two) times daily. 60 tablet 2  . traMADol (ULTRAM) 50 MG tablet Take 1 tablet (50 mg total) by mouth every 12 (twelve) hours as needed. 45 tablet 2   No facility-administered medications prior to visit.     ROS See HPI  Objective:  BP 136/78   Pulse 75   Temp 98 F (36.7 C)   Ht 5\' 2"  (1.575 m)   Wt 147 lb (66.7 kg)   LMP 11/26/2013   SpO2 98%   BMI 26.89 kg/m   BP Readings from Last 3 Encounters:  08/26/16 136/78  07/15/16 (!) 150/90  04/11/16 (!) 142/82    Wt Readings from Last 3 Encounters:  08/26/16 147 lb (66.7 kg)  07/15/16 150 lb (68 kg)  04/11/16 151 lb (68.5 kg)    Physical Exam  Constitutional: She is oriented to person, place, and time. No distress.  Cardiovascular: Normal rate, regular rhythm and normal heart sounds.   Pulmonary/Chest: Effort normal and breath sounds normal. No respiratory distress.  Musculoskeletal: Normal range of motion. She exhibits no edema.  Neurological: She is alert and oriented to person, place, and time.  Psychiatric: She has a normal mood and affect. Her behavior is normal.  Vitals reviewed.   Lab Results  Component Value Date   WBC 7.2 07/15/2016   HGB 12.3 07/15/2016   HCT 37.4 07/15/2016   PLT  362.0 07/15/2016   GLUCOSE 251 (H) 07/15/2016   CHOL 194 07/15/2016   TRIG 75.0 07/15/2016   HDL 56.60 07/15/2016   LDLDIRECT 131.3 02/15/2011   LDLCALC 122 (H) 07/15/2016   ALT 12 07/15/2016   AST 11 07/15/2016   NA 137 07/15/2016   K 4.3 07/15/2016   CL 100 07/15/2016   CREATININE 0.56 07/15/2016   BUN 12 07/15/2016   CO2 29 07/15/2016   TSH 1.54 10/17/2015   HGBA1C 9.2 (H) 07/15/2016    Dg Knee Complete 4 Views Right  Result Date: 12/26/2015 CLINICAL DATA:  51 y/o F; rule out osteoarthrosis. Generalized pain for 2 months. EXAM: RIGHT KNEE - COMPLETE 4+ VIEW COMPARISON:  06/21/2012 right knee radiographs. FINDINGS: No evidence of fracture, dislocation, or joint  effusion. No evidence of arthropathy or other focal bone abnormality. Superior and inferior patellar enthesophytes. Tiny periarticular osteophytes of the patellofemoral compartment. IMPRESSION: No significant joint space narrowing. Tiny osteophytes of the patellofemoral compartment. Electronically Signed   By: Kristine Garbe M.D.   On: 12/26/2015 16:10    Assessment & Plan:   Sheniah was seen today for medication problem.  Diagnoses and all orders for this visit:  ANXIETY DISORDER, GENERALIZED -     Discontinue: clonazePAM (KLONOPIN) 0.5 MG tablet; Take 1 tablet (0.5 mg total) by mouth at bedtime. -     Ambulatory referral to Psychology -     clonazePAM (KLONOPIN) 0.5 MG tablet; Take 1 tablet (0.5 mg total) by mouth at bedtime.  Depression, unspecified depression type -     Discontinue: clonazePAM (KLONOPIN) 0.5 MG tablet; Take 1 tablet (0.5 mg total) by mouth at bedtime. -     Ambulatory referral to Psychology -     clonazePAM (KLONOPIN) 0.5 MG tablet; Take 1 tablet (0.5 mg total) by mouth at bedtime.   I have discontinued Ms. Carmon's traMADol, busPIRone, and ALPRAZolam. I am also having her maintain her Ibuprofen-Famotidine, sertraline, albuterol, fluticasone, triamcinolone cream, promethazine-dextromethorphan, pravastatin, amLODipine, ibuprofen, esomeprazole, estradiol, pantoprazole, and clonazePAM.  Meds ordered this encounter  Medications  . DISCONTD: ALPRAZolam (XANAX) 0.5 MG tablet  . pantoprazole (PROTONIX) 40 MG tablet  . DISCONTD: clonazePAM (KLONOPIN) 0.5 MG tablet    Sig: Take 1 tablet (0.5 mg total) by mouth at bedtime.    Dispense:  30 tablet    Refill:  0    Order Specific Question:   Supervising Provider    Answer:   Cassandria Anger [[id number]]  . clonazePAM (KLONOPIN) 0.5 MG tablet    Sig: Take 1 tablet (0.5 mg total) by mouth at bedtime.    Dispense:  30 tablet    Refill:  0    Order Specific Question:   Supervising Provider    Answer:   Cassandria Anger [[id number]]    Follow-up: Return in about 4 weeks (around 09/23/2016) for anxiety and depression (use of klonopin).  Wilfred Lacy, NP

## 2016-08-26 NOTE — Patient Instructions (Signed)
You will be called to schedule appt wit psychology.  Generalized Anxiety Disorder, Adult Generalized anxiety disorder (GAD) is a mental health disorder. People with this condition constantly worry about everyday events. Unlike normal anxiety, worry related to GAD is not triggered by a specific event. These worries also do not fade or get better with time. GAD interferes with life functions, including relationships, work, and school. GAD can vary from mild to severe. People with severe GAD can have intense waves of anxiety with physical symptoms (panic attacks). What are the causes? The exact cause of GAD is not known. What increases the risk? This condition is more likely to develop in:  Women.  People who have a family history of anxiety disorders.  People who are very shy.  People who experience very stressful life events, such as the death of a loved one.  People who have a very stressful family environment.  What are the signs or symptoms? People with GAD often worry excessively about many things in their lives, such as their health and family. They may also be overly concerned about:  Doing well at work.  Being on time.  Natural disasters.  Friendships.  Physical symptoms of GAD include:  Fatigue.  Muscle tension or having muscle twitches.  Trembling or feeling shaky.  Being easily startled.  Feeling like your heart is pounding or racing.  Feeling out of breath or like you cannot take a deep breath.  Having trouble falling asleep or staying asleep.  Sweating.  Nausea, diarrhea, or irritable bowel syndrome (IBS).  Headaches.  Trouble concentrating or remembering facts.  Restlessness.  Irritability.  How is this diagnosed? Your health care provider can diagnose GAD based on your symptoms and medical history. You will also have a physical exam. The health care provider will ask specific questions about your symptoms, including how severe they are, when  they started, and if they come and go. Your health care provider may ask you about your use of alcohol or drugs, including prescription medicines. Your health care provider may refer you to a mental health specialist for further evaluation. Your health care provider will do a thorough examination and may perform additional tests to rule out other possible causes of your symptoms. To be diagnosed with GAD, a person must have anxiety that:  Is out of his or her control.  Affects several different aspects of his or her life, such as work and relationships.  Causes distress that makes him or her unable to take part in normal activities.  Includes at least three physical symptoms of GAD, such as restlessness, fatigue, trouble concentrating, irritability, muscle tension, or sleep problems.  Before your health care provider can confirm a diagnosis of GAD, these symptoms must be present more days than they are not, and they must last for six months or longer. How is this treated? The following therapies are usually used to treat GAD:  Medicine. Antidepressant medicine is usually prescribed for long-term daily control. Antianxiety medicines may be added in severe cases, especially when panic attacks occur.  Talk therapy (psychotherapy). Certain types of talk therapy can be helpful in treating GAD by providing support, education, and guidance. Options include: ? Cognitive behavioral therapy (CBT). People learn coping skills and techniques to ease their anxiety. They learn to identify unrealistic or negative thoughts and behaviors and to replace them with positive ones. ? Acceptance and commitment therapy (ACT). This treatment teaches people how to be mindful as a way to cope with unwanted  thoughts and feelings. ? Biofeedback. This process trains you to manage your body's response (physiological response) through breathing techniques and relaxation methods. You will work with a therapist while machines are  used to monitor your physical symptoms.  Stress management techniques. These include yoga, meditation, and exercise.  A mental health specialist can help determine which treatment is best for you. Some people see improvement with one type of therapy. However, other people require a combination of therapies. Follow these instructions at home:  Take over-the-counter and prescription medicines only as told by your health care provider.  Try to maintain a normal routine.  Try to anticipate stressful situations and allow extra time to manage them.  Practice any stress management or self-calming techniques as taught by your health care provider.  Do not punish yourself for setbacks or for not making progress.  Try to recognize your accomplishments, even if they are small.  Keep all follow-up visits as told by your health care provider. This is important. Contact a health care provider if:  Your symptoms do not get better.  Your symptoms get worse.  You have signs of depression, such as: ? A persistently sad, cranky, or irritable mood. ? Loss of enjoyment in activities that used to bring you joy. ? Change in weight or eating. ? Changes in sleeping habits. ? Avoiding friends or family members. ? Loss of energy for normal tasks. ? Feelings of guilt or worthlessness. Get help right away if:  You have serious thoughts about hurting yourself or others. If you ever feel like you may hurt yourself or others, or have thoughts about taking your own life, get help right away. You can go to your nearest emergency department or call:  Your local emergency services (911 in the [place]  A suicide crisis helpline, such as the [facility] at 931-253-0699. This is open 24 hours a day.  Summary  Generalized anxiety disorder (GAD) is a mental health disorder that involves worry that is not triggered by a specific event.  People with GAD often worry excessively about  many things in their lives, such as their health and family.  GAD may cause physical symptoms such as restlessness, trouble concentrating, sleep problems, frequent sweating, nausea, diarrhea, headaches, and trembling or muscle twitching.  A mental health specialist can help determine which treatment is best for you. Some people see improvement with one type of therapy. However, other people require a combination of therapies. This information is not intended to replace advice given to you by your health care provider. Make sure you discuss any questions you have with your health care provider. Document Released: 05/25/2012 Document Revised: 12/19/2015 Document Reviewed: 12/19/2015 Elsevier Interactive Patient Education  [employer].

## 2016-08-27 NOTE — Telephone Encounter (Signed)
Forms have been completed. Patient is coming by to sign & I will fax. Original will go to patient. Sent to scan&charged for. BO It will be scanned into the mothers chart.

## 2016-08-28 NOTE — Telephone Encounter (Signed)
I will not be doing that.

## 2016-08-28 NOTE — Telephone Encounter (Signed)
Patient called back and is requesting that her leave be 3-5 days a month. She states she need this for when her mom goes to the hospital because she the one who helps her there and when she comes home. Please advise. Thank you.

## 2016-08-29 NOTE — Telephone Encounter (Signed)
Patient has been informed. She states she may just see if she can have another one of her mothers doctors to fill out the forms the way she is wanting them.

## 2016-08-29 NOTE — Telephone Encounter (Signed)
ok 

## 2016-09-23 ENCOUNTER — Ambulatory Visit: Payer: BLUE CROSS/BLUE SHIELD | Admitting: Nurse Practitioner

## 2016-09-23 ENCOUNTER — Telehealth: Payer: Self-pay | Admitting: Nurse Practitioner

## 2016-09-23 DIAGNOSIS — F32A Depression, unspecified: Secondary | ICD-10-CM

## 2016-09-23 DIAGNOSIS — F329 Major depressive disorder, single episode, unspecified: Secondary | ICD-10-CM

## 2016-09-23 DIAGNOSIS — F411 Generalized anxiety disorder: Secondary | ICD-10-CM

## 2016-09-23 MED ORDER — CLONAZEPAM 0.5 MG PO TABS: 0.5000 mg | ORAL_TABLET | Freq: Every day | ORAL | 0 refills | Status: DC

## 2016-09-23 NOTE — Telephone Encounter (Signed)
Printed script for 1 week, notified pt rx faxed [facility]...[facility]

## 2016-09-23 NOTE — Telephone Encounter (Signed)
OK TO REFILL ENOUGH TO LAST TILL 8/20.

## 2016-09-23 NOTE — Telephone Encounter (Signed)
clonazePAM (KLONOPIN) 0.5 MG tablet  Patient had an appointment this morning and called at 811am and said she could not make it. She is still wanting a refill on this medication. She made a appointment for 8/20. She also states she thinks all her other medications are due as well. She is not sure which ones. Please advise. Thank you.

## 2016-09-30 ENCOUNTER — Ambulatory Visit (INDEPENDENT_AMBULATORY_CARE_PROVIDER_SITE_OTHER): Payer: BLUE CROSS/BLUE SHIELD | Admitting: Nurse Practitioner

## 2016-09-30 ENCOUNTER — Encounter: Payer: Self-pay | Admitting: Nurse Practitioner

## 2016-09-30 ENCOUNTER — Telehealth: Payer: Self-pay | Admitting: Internal Medicine

## 2016-09-30 VITALS — BP 164/86 | HR 79 | Temp 98.5°F | Ht 62.0 in | Wt 149.0 lb

## 2016-09-30 DIAGNOSIS — E118 Type 2 diabetes mellitus with unspecified complications: Secondary | ICD-10-CM

## 2016-09-30 DIAGNOSIS — F32A Depression, unspecified: Secondary | ICD-10-CM

## 2016-09-30 DIAGNOSIS — F329 Major depressive disorder, single episode, unspecified: Secondary | ICD-10-CM

## 2016-09-30 DIAGNOSIS — K219 Gastro-esophageal reflux disease without esophagitis: Secondary | ICD-10-CM | POA: Diagnosis not present

## 2016-09-30 DIAGNOSIS — F411 Generalized anxiety disorder: Secondary | ICD-10-CM | POA: Diagnosis not present

## 2016-09-30 MED ORDER — CLONAZEPAM 0.5 MG PO TABS: 0.5000 mg | ORAL_TABLET | Freq: Every day | ORAL | 2 refills | Status: DC

## 2016-09-30 MED ORDER — GLIPIZIDE ER 5 MG PO TB24: 5.0000 mg | ORAL_TABLET | Freq: Every day | ORAL | 2 refills | Status: DC

## 2016-09-30 MED ORDER — BLOOD GLUCOSE MONITOR KIT: PACK | 1 refills | Status: AC

## 2016-09-30 MED ORDER — ESOMEPRAZOLE MAGNESIUM 20 MG PO CPDR: 40.0000 mg | DELAYED_RELEASE_CAPSULE | Freq: Every day | ORAL | 1 refills | Status: DC

## 2016-09-30 MED ORDER — SERTRALINE HCL 100 MG PO TABS: 200.0000 mg | ORAL_TABLET | Freq: Every day | ORAL | 1 refills | Status: DC

## 2016-09-30 NOTE — Telephone Encounter (Signed)
Pt has requested to change PCP from [facility] to [facility]. Okay per [place]. A 3 month fu has been scheduled.

## 2016-09-30 NOTE — Progress Notes (Signed)
Subjective:  Patient ID: Brenda [name], female    DOB: 1965/06/26  Age: 51 y.o. MRN: [id number]  CC: Follow-up (4 wk follow up--left foot burning sensation on bottom--)   HPI DM: No medication use at this time. She does not want to take metformin due to potential side effects. Reports Home glucose: 180-250 (fasting and postprandial) No change in diet. No exercise  HTN: Stable with amlodipine. BP Readings from Last 3 Encounters:  09/30/16 (!) 164/86  08/26/16 136/78  07/15/16 (!) 150/90   Anxiety and Depression: Stable with zoloft and klonopin. Improved sleep. No SI or HI Needs medication refill. She was contacted for psychology appt but she did not schedule.  Outpatient Medications Prior to Visit  Medication Sig Dispense Refill  . albuterol (PROVENTIL HFA;VENTOLIN HFA) 108 (90 Base) MCG/ACT inhaler Inhale 2 puffs into the lungs every 6 (six) hours as needed for wheezing or shortness of breath. 1 Inhaler 2  . amLODipine (NORVASC) 5 MG tablet TAKE ONE TABLET DAILY 90 tablet 1  . estradiol (VIVELLE-DOT) 0.05 MG/24HR patch Place 1 patch (0.05 mg total) onto the skin 2 (two) times a week. 8 patch 12  . fluticasone (FLONASE) 50 MCG/ACT nasal spray Place 2 sprays into both nostrils daily. 16 g 3  . ibuprofen (ADVIL,MOTRIN) 600 MG tablet TAKE 1 TABLET (600 MG TOTAL) BY MOUTH EVERY 8 (EIGHT) HOURS AS NEEDED. 90 tablet 0  . Ibuprofen-Famotidine 800-26.6 MG TABS Take 1 tablet by mouth 3 (three) times daily as needed. 90 tablet 1  . pantoprazole (PROTONIX) 40 MG tablet     . pravastatin (PRAVACHOL) 40 MG tablet TAKE ONE TABLET DAILY 90 tablet 1  . promethazine-dextromethorphan (PROMETHAZINE-DM) 6.25-15 MG/5ML syrup Take 5 mLs by mouth 3 (three) times daily as needed for cough. 100 mL 0  . triamcinolone cream (KENALOG) 0.1 % Apply 1 application topically 2 (two) times daily. 30 g 3  . clonazePAM (KLONOPIN) 0.5 MG tablet Take 1 tablet (0.5 mg total) by mouth at bedtime. Must keep 09/30/16  appt for future refills 7 tablet 0  . esomeprazole (NEXIUM) 40 MG capsule Take 1 capsule (40 mg total) by mouth daily. 30 capsule 3  . sertraline (ZOLOFT) 100 MG tablet Take 2 tablets (200 mg total) by mouth daily. 60 tablet 5   No facility-administered medications prior to visit.     ROS See HPI  Objective:  BP (!) 164/86   Pulse 79   Temp 98.5 F (36.9 C)   Ht _0  (1.575 m)   Wt 149 lb (67.6 kg)   LMP 11/26/2013   SpO2 99%   BMI 27.25 kg/m   BP Readings from Last 3 Encounters:  09/30/16 (!) 164/86  08/26/16 136/78  07/15/16 (!) 150/90    Wt Readings from Last 3 Encounters:  09/30/16 149 lb (67.6 kg)  08/26/16 147 lb (66.7 kg)  07/15/16 150 lb (68 kg)    Physical Exam  Constitutional: She is oriented to person, place, and time. No distress.  Cardiovascular: Normal rate, normal heart sounds and intact distal pulses.   Pulmonary/Chest: Effort normal and breath sounds normal.  Neurological: She is alert and oriented to person, place, and time.  Normal diabetic foot exam  Skin: Skin is warm and dry.  Psychiatric: She has a normal mood and affect. Her behavior is normal.  Vitals reviewed.   Lab Results  Component Value Date   WBC 7.2 07/15/2016   HGB 12.3 07/15/2016   HCT 37.4 07/15/2016  PLT 362.0 07/15/2016   GLUCOSE 251 (H) 07/15/2016   CHOL 194 07/15/2016   TRIG 75.0 07/15/2016   HDL 56.60 07/15/2016   LDLDIRECT 131.3 02/15/2011   LDLCALC 122 (H) 07/15/2016   ALT 12 07/15/2016   AST 11 07/15/2016   NA 137 07/15/2016   K 4.3 07/15/2016   CL 100 07/15/2016   CREATININE 0.56 07/15/2016   BUN 12 07/15/2016   CO2 29 07/15/2016   TSH 1.54 10/17/2015   HGBA1C 9.2 (H) 07/15/2016    Dg Knee Complete 4 Views Right  Result Date: 12/26/2015 CLINICAL DATA:  51 y/o F; rule out osteoarthrosis. Generalized pain for 2 months. EXAM: RIGHT KNEE - COMPLETE 4+ VIEW COMPARISON:  06/21/2012 right knee radiographs. FINDINGS: No evidence of fracture, dislocation, or  joint effusion. No evidence of arthropathy or other focal bone abnormality. Superior and inferior patellar enthesophytes. Tiny periarticular osteophytes of the patellofemoral compartment. IMPRESSION: No significant joint space narrowing. Tiny osteophytes of the patellofemoral compartment. Electronically Signed   By: Kristine Garbe M.D.   On: 12/26/2015 16:10    Assessment & Plan:   Brenda [name] was seen today for follow-up.  Diagnoses and all orders for this visit:  Type 2 diabetes mellitus with complication, without long-term current use of insulin (HCC) -     glipiZIDE (GLUCOTROL XL) 5 MG 24 hr tablet; Take 1 tablet (5 mg total) by mouth daily with breakfast. -     blood glucose meter kit and supplies KIT; Dispense based on patient and insurance preference.  Use before breakfast and at bedtime. E11.4  ANXIETY DISORDER, GENERALIZED -     sertraline (ZOLOFT) 100 MG tablet; Take 2 tablets (200 mg total) by mouth daily. -     clonazePAM (KLONOPIN) 0.5 MG tablet; Take 1 tablet (0.5 mg total) by mouth at bedtime. Must keep 09/30/16 appt for future refills  Depression, unspecified depression type -     sertraline (ZOLOFT) 100 MG tablet; Take 2 tablets (200 mg total) by mouth daily. -     clonazePAM (KLONOPIN) 0.5 MG tablet; Take 1 tablet (0.5 mg total) by mouth at bedtime. Must keep 09/30/16 appt for future refills  Gastroesophageal reflux disease without esophagitis -     esomeprazole (NEXIUM) 20 MG capsule; Take 2 capsules (40 mg total) by mouth daily.   I have changed Brenda [name]'s esomeprazole. I am also having her start on glipiZIDE and blood glucose meter kit and supplies. Additionally, I am having her maintain her Ibuprofen-Famotidine, albuterol, fluticasone, triamcinolone cream, promethazine-dextromethorphan, pravastatin, amLODipine, ibuprofen, estradiol, pantoprazole, sertraline, and clonazePAM.  Meds ordered this encounter  Medications  . glipiZIDE (GLUCOTROL XL) 5 MG 24 hr tablet     Sig: Take 1 tablet (5 mg total) by mouth daily with breakfast.    Dispense:  30 tablet    Refill:  2    Order Specific Question:   Supervising Provider    Answer:   Cassandria Anger [[id number]]  . sertraline (ZOLOFT) 100 MG tablet    Sig: Take 2 tablets (200 mg total) by mouth daily.    Dispense:  180 tablet    Refill:  1    Order Specific Question:   Supervising Provider    Answer:   Cassandria Anger [[id number]]  . esomeprazole (NEXIUM) 20 MG capsule    Sig: Take 2 capsules (40 mg total) by mouth daily.    Dispense:  90 capsule    Refill:  1    Order Specific Question:  Supervising Provider    Answer:   Cassandria Anger [[id number]]  . clonazePAM (KLONOPIN) 0.5 MG tablet    Sig: Take 1 tablet (0.5 mg total) by mouth at bedtime. Must keep 09/30/16 appt for future refills    Dispense:  30 tablet    Refill:  2    Order Specific Question:   Supervising Provider    Answer:   Cassandria Anger [[id number]]  . blood glucose meter kit and supplies KIT    Sig: Dispense based on patient and insurance preference.  Use before breakfast and at bedtime. E11.4    Dispense:  1 each    Refill:  1    Order Specific Question:   Supervising Provider    Answer:   Cassandria Anger [[id number]]    Order Specific Question:   Number of strips    Answer:   100    Order Specific Question:   Number of lancets    Answer:   100    Follow-up: Return in about 3 months (around 12/31/2016) for depression, anxiety, DM.  Wilfred Lacy, NP

## 2016-09-30 NOTE — Patient Instructions (Signed)
Diabetes Mellitus and Standards of Medical Care Managing diabetes (diabetes mellitus) can be complicated. Your diabetes treatment may be managed by a team of health care providers, including:  A diet and nutrition specialist (registered dietitian).  A nurse.  A certified diabetes educator (CDE).  A diabetes specialist (endocrinologist).  An eye doctor.  A primary care provider.  A dentist.  Your health care providers follow a schedule in order to help you get the best quality of care. The following schedule is a general guideline for your diabetes management plan. Your health care providers may also give you more specific instructions. HbA1c ( hemoglobin A1c) test This test provides information about blood sugar (glucose) control over the previous 2-3 months. It is used to check whether your diabetes management plan needs to be adjusted.  If you are meeting your treatment goals, this test is done at least 2 times a year.  If you are not meeting treatment goals or if your treatment goals have changed, this test is done 4 times a year.  Blood pressure test  This test is done at every routine medical visit. For most people, the goal is less than 130/80. Ask your health care provider what your goal blood pressure should be. Dental and eye exams  Visit your dentist two times a year.  If you have type 1 diabetes, get an eye exam 3-5 years after you are diagnosed, and then once a year after your first exam. ? If you were diagnosed with type 1 diabetes as a child, get an eye exam when you are age 16 or older and have had diabetes for 3-5 years. After the first exam, you should get an eye exam once a year.  If you have type 2 diabetes, have an eye exam as soon as you are diagnosed, and then once a year after your first exam. Foot care exam  Visual foot exams are done at every routine medical visit. The exams check for cuts, bruises, redness, blisters, sores, or other problems with the  feet.  A complete foot exam is done by your health care provider once a year. This exam includes an inspection of the structure and skin of your feet, and a check of the pulses and sensation in your feet. ? Type 1 diabetes: Get your first exam 3-5 years after diagnosis. ? Type 2 diabetes: Get your first exam as soon as you are diagnosed.  Check your feet every day for cuts, bruises, redness, blisters, or sores. If you have any of these or other problems that are not healing, contact your health care provider. Kidney function test ( urine microalbumin)  This test is done once a year. ? Type 1 diabetes: Get your first test 5 years after diagnosis. ? Type 2 diabetes: Get your first test as soon as you are diagnosed.  If you have chronic kidney disease (CKD), get a serum creatinine and estimated glomerular filtration rate (eGFR) test once a year. Lipid profile (cholesterol, HDL, LDL, triglycerides)  This test should be done when you are diagnosed with diabetes, and every 5 years after the first test. If you are on medicines to lower your cholesterol, you may need to get this test done every year. ? The goal for LDL is less than 100 mg/dL (5.5 mmol/L). If you are at high risk, the goal is less than 70 mg/dL (3.9 mmol/L). ? The goal for HDL is 40 mg/dL (2.2 mmol/L) for men and 50 mg/dL(2.8 mmol/L) for women. An HDL  cholesterol of 60 mg/dL (3.3 mmol/L) or higher gives some protection against heart disease. ? The goal for triglycerides is less than 150 mg/dL (8.3 mmol/L). Immunizations  The yearly flu (influenza) vaccine is recommended for everyone 6 months or older who has diabetes.  The pneumonia (pneumococcal) vaccine is recommended for everyone 2 years or older who has diabetes. If you are 26 or older, you may get the pneumonia vaccine as a series of two separate shots.  The hepatitis B vaccine is recommended for adults shortly after they have been diagnosed with diabetes.  The Tdap  (tetanus, diphtheria, and pertussis) vaccine should be given: ? According to normal childhood vaccination schedules, for children. ? Every 10 years, for adults who have diabetes.  The shingles vaccine is recommended for people who have had chicken pox and are 50 years or older. Mental and emotional health  Screening for symptoms of eating disorders, anxiety, and depression is recommended at the time of diagnosis and afterward as needed. If your screening shows that you have symptoms (you have a positive screening result), you may need further evaluation and be referred to a mental health care provider. Diabetes self-management education  Education about how to manage your diabetes is recommended at diagnosis and ongoing as needed. Treatment plan  Your treatment plan will be reviewed at every medical visit. Summary  Managing diabetes (diabetes mellitus) can be complicated. Your diabetes treatment may be managed by a team of health care providers.  Your health care providers follow a schedule in order to help you get the best quality of care.  Standards of care including having regular physical exams, blood tests, blood pressure monitoring, immunizations, screening tests, and education about how to manage your diabetes.  Your health care providers may also give you more specific instructions based on your individual health. This information is not intended to replace advice given to you by your health care provider. Make sure you discuss any questions you have with your health care provider. Document Released: 11/25/2008 Document Revised: 10/27/2015 Document Reviewed: 10/27/2015 Elsevier Interactive Patient Education  2018 [employer].   Diabetes and Foot Care Diabetes may cause you to have problems because of poor blood supply (circulation) to your feet and legs. This may cause the skin on your feet to become thinner, break easier, and heal more slowly. Your skin may become dry, and the  skin may peel and crack. You may also have nerve damage in your legs and feet causing decreased feeling in them. You may not notice minor injuries to your feet that could lead to infections or more serious problems. Taking care of your feet is one of the most important things you can do for yourself. Follow these instructions at home:  Wear shoes at all times, even in the house. Do not go barefoot. Bare feet are easily injured.  Check your feet daily for blisters, cuts, and redness. If you cannot see the bottom of your feet, use a mirror or ask someone for help.  Wash your feet with warm water (do not use hot water) and mild soap. Then pat your feet and the areas between your toes until they are completely dry. Do not soak your feet as this can dry your skin.  Apply a moisturizing lotion or petroleum jelly (that does not contain alcohol and is unscented) to the skin on your feet and to dry, brittle toenails. Do not apply lotion between your toes.  Trim your toenails straight across. Do not dig under  them or around the cuticle. File the edges of your nails with an emery board or nail file.  Do not cut corns or calluses or try to remove them with medicine.  Wear clean socks or stockings every day. Make sure they are not too tight. Do not wear knee-high stockings since they may decrease blood flow to your legs.  Wear shoes that fit properly and have enough cushioning. To break in new shoes, wear them for just a few hours a day. This prevents you from injuring your feet. Always look in your shoes before you put them on to be sure there are no objects inside.  Do not cross your legs. This may decrease the blood flow to your feet.  If you find a minor scrape, cut, or break in the skin on your feet, keep it and the skin around it clean and dry. These areas may be cleansed with mild soap and water. Do not cleanse the area with peroxide, alcohol, or iodine.  When you remove an adhesive bandage, be sure  not to damage the skin around it.  If you have a wound, look at it several times a day to make sure it is healing.  Do not use heating pads or hot water bottles. They may burn your skin. If you have lost feeling in your feet or legs, you may not know it is happening until it is too late.  Make sure your health care provider performs a complete foot exam at least annually or more often if you have foot problems. Report any cuts, sores, or bruises to your health care provider immediately. Contact a health care provider if:  You have an injury that is not healing.  You have cuts or breaks in the skin.  You have an ingrown nail.  You notice redness on your legs or feet.  You feel burning or tingling in your legs or feet.  You have pain or cramps in your legs and feet.  Your legs or feet are numb.  Your feet always feel cold. Get help right away if:  There is increasing redness, swelling, or pain in or around a wound.  There is a red line that goes up your leg.  Pus is coming from a wound.  You develop a fever or as directed by your health care provider.  You notice a bad smell coming from an ulcer or wound. This information is not intended to replace advice given to you by your health care provider. Make sure you discuss any questions you have with your health care provider. Document Released: 01/26/2000 Document Revised: 07/06/2015 Document Reviewed: 07/07/2012 Elsevier Interactive Patient Education  2017 [employer].    Diabetes Mellitus and Exercise Exercising regularly is important for your overall health, especially when you have diabetes (diabetes mellitus). Exercising is not only about losing weight. It has many health benefits, such as increasing muscle strength and bone density and reducing body fat and stress. This leads to improved fitness, flexibility, and endurance, all of which result in better overall health. Exercise has additional benefits for people with  diabetes, including:  Reducing appetite.  Helping to lower and control blood glucose.  Lowering blood pressure.  Helping to control amounts of fatty substances (lipids) in the blood, such as cholesterol and triglycerides.  Helping the body to respond better to insulin (improving insulin sensitivity).  Reducing how much insulin the body needs.  Decreasing the risk for heart disease by: ? Lowering cholesterol and triglyceride levels. ?  Increasing the levels of good cholesterol. ? Lowering blood glucose levels.  What is my activity plan? Your health care provider or certified diabetes educator can help you make a plan for the type and frequency of exercise (activity plan) that works for you. Make sure that you:  Do at least 150 minutes of moderate-intensity or vigorous-intensity exercise each week. This could be brisk walking, biking, or water aerobics. ? Do stretching and strength exercises, such as yoga or weightlifting, at least 2 times a week. ? Spread out your activity over at least 3 days of the week.  Get some form of physical activity every day. ? Do not go more than 2 days in a row without some kind of physical activity. ? Avoid being inactive for more than 90 minutes at a time. Take frequent breaks to walk or stretch.  Choose a type of exercise or activity that you enjoy, and set realistic goals.  Start slowly, and gradually increase the intensity of your exercise over time.  What do I need to know about managing my diabetes?  Check your blood glucose before and after exercising. ? If your blood glucose is higher than 240 mg/dL (13.3 mmol/L) before you exercise, check your urine for ketones. If you have ketones in your urine, do not exercise until your blood glucose returns to normal.  Know the symptoms of low blood glucose (hypoglycemia) and how to treat it. Your risk for hypoglycemia increases during and after exercise. Common symptoms of hypoglycemia can  include: ? Hunger. ? Anxiety. ? Sweating and feeling clammy. ? Confusion. ? Dizziness or feeling light-headed. ? Increased heart rate or palpitations. ? Blurry vision. ? Tingling or numbness around the mouth, lips, or tongue. ? Tremors or shakes. ? Irritability.  Keep a rapid-acting carbohydrate snack available before, during, and after exercise to help prevent or treat hypoglycemia.  Avoid injecting insulin into areas of the body that are going to be exercised. For example, avoid injecting insulin into: ? The arms, when playing tennis. ? The legs, when jogging.  Keep records of your exercise habits. Doing this can help you and your health care provider adjust your diabetes management plan as needed. Write down: ? Food that you eat before and after you exercise. ? Blood glucose levels before and after you exercise. ? The type and amount of exercise you have done. ? When your insulin is expected to peak, if you use insulin. Avoid exercising at times when your insulin is peaking.  When you start a new exercise or activity, work with your health care provider to make sure the activity is safe for you, and to adjust your insulin, medicines, or food intake as needed.  Drink plenty of water while you exercise to prevent dehydration or heat stroke. Drink enough fluid to keep your urine clear or pale yellow. This information is not intended to replace advice given to you by your health care provider. Make sure you discuss any questions you have with your health care provider. Document Released: 04/20/2003 Document Revised: 08/18/2015 Document Reviewed: 07/10/2015 Elsevier Interactive Patient Education  2018 [employer].

## 2016-10-01 NOTE — Assessment & Plan Note (Addendum)
Stable with klonopin and zoloft. She is to schedule appt with psychology ASAP. No change in klonopin dose or frequency at this time.

## 2016-10-01 NOTE — Assessment & Plan Note (Signed)
hgbA1c of 9.2 07/15/2016. Patient decline metformin prescription due to potential side effects. Prescribed glipizide 5mg  today. Counseled about importance of lifestyle changes: diet and regular exercise in combination with medication. She is to also check glucose AM and PM.

## 2016-10-01 NOTE — Assessment & Plan Note (Signed)
Stable with klonopin and zoloft. She is to schedule appt with psychology ASAP. No change in klonopin dose or frequency at this time.

## 2016-10-02 ENCOUNTER — Ambulatory Visit: Payer: Self-pay | Admitting: Licensed Clinical Social Worker

## 2016-10-24 ENCOUNTER — Other Ambulatory Visit: Payer: Self-pay | Admitting: Internal Medicine

## 2016-10-24 DIAGNOSIS — Z1231 Encounter for screening mammogram for malignant neoplasm of breast: Secondary | ICD-10-CM

## 2016-10-30 ENCOUNTER — Ambulatory Visit: Payer: Self-pay | Admitting: Nurse Practitioner

## 2016-11-07 ENCOUNTER — Ambulatory Visit: Payer: Self-pay

## 2016-11-12 ENCOUNTER — Other Ambulatory Visit: Payer: Self-pay | Admitting: Internal Medicine

## 2016-11-12 DIAGNOSIS — J069 Acute upper respiratory infection, unspecified: Secondary | ICD-10-CM

## 2016-11-29 ENCOUNTER — Ambulatory Visit: Payer: Self-pay | Admitting: Nurse Practitioner

## 2016-12-30 ENCOUNTER — Encounter: Payer: Self-pay | Admitting: Nurse Practitioner

## 2016-12-30 ENCOUNTER — Ambulatory Visit (INDEPENDENT_AMBULATORY_CARE_PROVIDER_SITE_OTHER): Payer: BLUE CROSS/BLUE SHIELD | Admitting: Nurse Practitioner

## 2016-12-30 VITALS — BP 116/82 | HR 58 | Temp 97.9°F | Ht 62.0 in | Wt 149.0 lb

## 2016-12-30 DIAGNOSIS — J0140 Acute pansinusitis, unspecified: Secondary | ICD-10-CM | POA: Diagnosis not present

## 2016-12-30 DIAGNOSIS — Z1231 Encounter for screening mammogram for malignant neoplasm of breast: Secondary | ICD-10-CM

## 2016-12-30 DIAGNOSIS — Z23 Encounter for immunization: Secondary | ICD-10-CM

## 2016-12-30 DIAGNOSIS — I10 Essential (primary) hypertension: Secondary | ICD-10-CM

## 2016-12-30 DIAGNOSIS — J069 Acute upper respiratory infection, unspecified: Secondary | ICD-10-CM | POA: Diagnosis not present

## 2016-12-30 DIAGNOSIS — F411 Generalized anxiety disorder: Secondary | ICD-10-CM

## 2016-12-30 DIAGNOSIS — Z1239 Encounter for other screening for malignant neoplasm of breast: Secondary | ICD-10-CM

## 2016-12-30 DIAGNOSIS — F329 Major depressive disorder, single episode, unspecified: Secondary | ICD-10-CM

## 2016-12-30 DIAGNOSIS — E7800 Pure hypercholesterolemia, unspecified: Secondary | ICD-10-CM | POA: Diagnosis not present

## 2016-12-30 DIAGNOSIS — E118 Type 2 diabetes mellitus with unspecified complications: Secondary | ICD-10-CM | POA: Diagnosis not present

## 2016-12-30 DIAGNOSIS — K219 Gastro-esophageal reflux disease without esophagitis: Secondary | ICD-10-CM

## 2016-12-30 DIAGNOSIS — F32A Depression, unspecified: Secondary | ICD-10-CM

## 2016-12-30 LAB — MICROALBUMIN / CREATININE URINE RATIO
CREATININE, U: 233.5 mg/dL
MICROALB UR: 9.8 mg/dL — AB (ref 0.0–1.9)
MICROALB/CREAT RATIO: 4.2 mg/g (ref 0.0–30.0)

## 2016-12-30 LAB — HEMOGLOBIN A1C: HEMOGLOBIN A1C: 6.9 % — AB (ref 4.6–6.5)

## 2016-12-30 MED ORDER — BENZONATATE 100 MG PO CAPS: 100.0000 mg | ORAL_CAPSULE | Freq: Three times a day (TID) | ORAL | 0 refills | Status: DC | PRN

## 2016-12-30 MED ORDER — GUAIFENESIN ER 600 MG PO TB12: 600.0000 mg | ORAL_TABLET | Freq: Two times a day (BID) | ORAL | 0 refills | Status: DC | PRN

## 2016-12-30 MED ORDER — PREDNISONE 20 MG PO TABS: 40.0000 mg | ORAL_TABLET | Freq: Every day | ORAL | 0 refills | Status: DC

## 2016-12-30 MED ORDER — SERTRALINE HCL 100 MG PO TABS: 100.0000 mg | ORAL_TABLET | Freq: Every day | ORAL | 1 refills | Status: DC

## 2016-12-30 MED ORDER — AMOXICILLIN-POT CLAVULANATE 875-125 MG PO TABS: 1.0000 | ORAL_TABLET | Freq: Two times a day (BID) | ORAL | 0 refills | Status: DC

## 2016-12-30 MED ORDER — FLUTICASONE PROPIONATE 50 MCG/ACT NA SUSP: 2.0000 | Freq: Every day | NASAL | 3 refills | Status: DC

## 2016-12-30 MED ORDER — SALINE SPRAY 0.65 % NA SOLN: 1.0000 | NASAL | 0 refills | Status: DC | PRN

## 2016-12-30 MED ORDER — TRAZODONE HCL 50 MG PO TABS: 50.0000 mg | ORAL_TABLET | Freq: Every day | ORAL | 0 refills | Status: DC

## 2016-12-30 NOTE — Assessment & Plan Note (Signed)
Uncontrolled symptoms (insomnia, racing thoughts, lack of motivation to exercise or diet). No SI or HI, no ETOH or illicit substance use. Encourage patient to make appt with psychologist. Added trazodone 50mg  Stop klonopin prn Decrease zoloft to 100mg . F/up in 81month.

## 2016-12-30 NOTE — Progress Notes (Signed)
Subjective:  Patient ID: Brenda [name], female    DOB: [birth year]  Age: 51 y.o. MRN: [id number]  CC: Follow-up (3 month-She states she has a cough and sinus pressure/drainage. She has been taking OTC with no improvement. )   HPI  HTN: Improved with amlodipine. BP Readings from Last 3 Encounters:  12/30/16 116/82  09/30/16 (!) 164/86  08/26/16 136/78   DM: Home glucose: has not checked since last OV Hypoglycemic symptoms during first weeks of using glipizide: initially has jittery and headache with reading of 78. Current medication: glypizide. She has changed diet: low sodium and low fat. Has not had eye exam. Needs referral.  URI: Onset 2weeks ago. No improvement with afrin, mucinex, tussin and naproxen. Has cough and nasal congestion( minimal drainage), Sinus pressure and headache. No fever, no dizziness or syncope, no SOB, no chest pain, no ear pain.  Anxiety and Depression: Persistent low energy, anhedonia and in adequate sleep. No SI or HI. No hallucination. Mood is related to family discord (siblings not getting along, mother with ETOH abuse and depression secondary to separation from step father). She denies any substance abuse or ETOH use. She manages her stress by staying busy at work. She did not make appt with psychology as previously discussed. She does not think it is necessary even though she does recognize that her mood has not improved with current medications. (zoloft and klonopin).  Outpatient Medications Prior to Visit  Medication Sig Dispense Refill  . blood glucose meter kit and supplies KIT Dispense based on patient and insurance preference.  Use before breakfast and at bedtime. E11.4 1 each 1  . estradiol (VIVELLE-DOT) 0.05 MG/24HR patch Place 1 patch (0.05 mg total) onto the skin 2 (two) times a week. 8 patch 12  . Ibuprofen-Famotidine 800-26.6 MG TABS Take 1 tablet by mouth 3 (three) times daily as needed. 90 tablet 1  . pantoprazole (PROTONIX) 40  MG tablet     . triamcinolone cream (KENALOG) 0.1 % Apply 1 application topically 2 (two) times daily. 30 g 3  . albuterol (PROVENTIL HFA;VENTOLIN HFA) 108 (90 Base) MCG/ACT inhaler Inhale 2 puffs into the lungs every 6 (six) hours as needed for wheezing or shortness of breath. 1 Inhaler 2  . amLODipine (NORVASC) 5 MG tablet TAKE ONE TABLET DAILY 90 tablet 1  . clonazePAM (KLONOPIN) 0.5 MG tablet Take 1 tablet (0.5 mg total) by mouth at bedtime. Must keep 09/30/16 appt for future refills 30 tablet 2  . esomeprazole (NEXIUM) 20 MG capsule Take 2 capsules (40 mg total) by mouth daily. 90 capsule 1  . fluticasone (FLONASE) 50 MCG/ACT nasal spray Place 2 sprays into both nostrils daily. 16 g 3  . glipiZIDE (GLUCOTROL XL) 5 MG 24 hr tablet Take 1 tablet (5 mg total) by mouth daily with breakfast. 30 tablet 2  . ibuprofen (ADVIL,MOTRIN) 600 MG tablet TAKE 1 TABLET (600 MG TOTAL) BY MOUTH EVERY 8 (EIGHT) HOURS AS NEEDED. 90 tablet 0  . pravastatin (PRAVACHOL) 40 MG tablet TAKE ONE TABLET DAILY 90 tablet 1  . promethazine-dextromethorphan (PROMETHAZINE-DM) 6.25-15 MG/5ML syrup Take 5 mLs by mouth 3 (three) times daily as needed for cough. 100 mL 0  . sertraline (ZOLOFT) 100 MG tablet Take 2 tablets (200 mg total) by mouth daily. 180 tablet 1   No facility-administered medications prior to visit.     ROS See HPI  Objective:  BP 116/82 (BP Location: Left Arm, Patient Position: Sitting, Cuff Size: Normal)   Pulse Marland Kitchen)  58   Temp 97.9 F (36.6 C) (Oral)   Ht 5' 2" (1.575 m)   Wt 149 lb (67.6 kg)   LMP 11/26/2013   SpO2 98%   BMI 27.25 kg/m   BP Readings from Last 3 Encounters:  12/30/16 116/82  09/30/16 (!) 164/86  08/26/16 136/78    Wt Readings from Last 3 Encounters:  12/30/16 149 lb (67.6 kg)  09/30/16 149 lb (67.6 kg)  08/26/16 147 lb (66.7 kg)    Physical Exam  Constitutional: She is oriented to person, place, and time.  HENT:  Right Ear: Tympanic membrane, external ear and ear  canal normal.  Left Ear: Tympanic membrane, external ear and ear canal normal.  Nose: Mucosal edema and rhinorrhea present. Right sinus exhibits maxillary sinus tenderness and frontal sinus tenderness. Left sinus exhibits maxillary sinus tenderness and frontal sinus tenderness.  Mouth/Throat: Uvula is midline. No trismus in the jaw. Posterior oropharyngeal erythema present. No oropharyngeal exudate.  Cardiovascular: Normal rate and regular rhythm.  Pulmonary/Chest: Effort normal and breath sounds normal.  Lymphadenopathy:    She has no cervical adenopathy.  Neurological: She is alert and oriented to person, place, and time.  Psychiatric: Her behavior is normal. Thought content normal.  Vitals reviewed.   Lab Results  Component Value Date   WBC 7.2 07/15/2016   HGB 12.3 07/15/2016   HCT 37.4 07/15/2016   PLT 362.0 07/15/2016   GLUCOSE 251 (H) 07/15/2016   CHOL 194 07/15/2016   TRIG 75.0 07/15/2016   HDL 56.60 07/15/2016   LDLDIRECT 131.3 02/15/2011   LDLCALC 122 (H) 07/15/2016   ALT 12 07/15/2016   AST 11 07/15/2016   NA 137 07/15/2016   K 4.3 07/15/2016   CL 100 07/15/2016   CREATININE 0.56 07/15/2016   BUN 12 07/15/2016   CO2 29 07/15/2016   TSH 1.54 10/17/2015   HGBA1C 6.9 (H) 12/30/2016   MICROALBUR 9.8 (H) 12/30/2016    Dg Knee Complete 4 Views Right  Result Date: 12/26/2015 CLINICAL DATA:  51 y/o F; rule out osteoarthrosis. Generalized pain for 2 months. EXAM: RIGHT KNEE - COMPLETE 4+ VIEW COMPARISON:  06/21/2012 right knee radiographs. FINDINGS: No evidence of fracture, dislocation, or joint effusion. No evidence of arthropathy or other focal bone abnormality. Superior and inferior patellar enthesophytes. Tiny periarticular osteophytes of the patellofemoral compartment. IMPRESSION: No significant joint space narrowing. Tiny osteophytes of the patellofemoral compartment. Electronically Signed   By: Kristine Garbe M.D.   On: 12/26/2015 16:10    Assessment &  Plan:   Brenda [name] was seen today for follow-up.  Diagnoses and all orders for this visit:  Essential hypertension, benign -     amLODipine (NORVASC) 5 MG tablet; Take 1 tablet (5 mg total) daily by mouth.  Type 2 diabetes mellitus with complication, without long-term current use of insulin (HCC) -     Hemoglobin A1c -     Microalbumin / creatinine urine ratio -     Ambulatory referral to Ophthalmology -     lisinopril (PRINIVIL,ZESTRIL) 2.5 MG tablet; Take 1 tablet (2.5 mg total) daily by mouth. -     glipiZIDE (GLUCOTROL XL) 5 MG 24 hr tablet; Take 1 tablet (5 mg total) daily with breakfast by mouth.  Depression, unspecified depression type -     traZODone (DESYREL) 50 MG tablet; Take 1 tablet (50 mg total) at bedtime by mouth. -     sertraline (ZOLOFT) 100 MG tablet; Take 1 tablet (100 mg total) daily by mouth.  Acute non-recurrent pansinusitis  Acute URI -     fluticasone (FLONASE) 50 MCG/ACT nasal spray; Place 2 sprays daily into both nostrils. -     guaiFENesin (MUCINEX) 600 MG 12 hr tablet; Take 1 tablet (600 mg total) 2 (two) times daily as needed by mouth for cough or to loosen phlegm. -     predniSONE (DELTASONE) 20 MG tablet; Take 2 tablets (40 mg total) daily with breakfast by mouth. -     benzonatate (TESSALON) 100 MG capsule; Take 1 capsule (100 mg total) 3 (three) times daily as needed by mouth for cough. -     sodium chloride (OCEAN) 0.65 % SOLN nasal spray; Place 1 spray as needed into both nostrils for congestion. -     amoxicillin-clavulanate (AUGMENTIN) 875-125 MG tablet; Take 1 tablet 2 (two) times daily by mouth. -     albuterol (PROVENTIL HFA;VENTOLIN HFA) 108 (90 Base) MCG/ACT inhaler; Inhale 2 puffs every 6 (six) hours as needed into the lungs for wheezing or shortness of breath.  ANXIETY DISORDER, GENERALIZED -     traZODone (DESYREL) 50 MG tablet; Take 1 tablet (50 mg total) at bedtime by mouth. -     sertraline (ZOLOFT) 100 MG tablet; Take 1 tablet (100 mg  total) daily by mouth.  Breast cancer screening -     MM DIGITAL SCREENING BILATERAL; Future  Gastroesophageal reflux disease without esophagitis -     esomeprazole (NEXIUM) 20 MG capsule; Take 1 capsule (20 mg total) daily by mouth.  HYPERCHOLESTEROLEMIA -     pravastatin (PRAVACHOL) 40 MG tablet; Take 1 tablet (40 mg total) daily by mouth.  Other orders -     Pneumococcal polysaccharide vaccine 23-valent greater than or equal to 2yo subcutaneous/IM   I have discontinued Brenda [name]'s promethazine-dextromethorphan, ibuprofen, and clonazePAM. I have also changed her fluticasone, sertraline, amLODipine, albuterol, esomeprazole, glipiZIDE, and pravastatin. Additionally, I am having her start on guaiFENesin, predniSONE, benzonatate, sodium chloride, traZODone, amoxicillin-clavulanate, and lisinopril. Lastly, I am having her maintain her Ibuprofen-Famotidine, triamcinolone cream, estradiol, pantoprazole, and blood glucose meter kit and supplies.  Meds ordered this encounter  Medications  . fluticasone (FLONASE) 50 MCG/ACT nasal spray    Sig: Place 2 sprays daily into both nostrils.    Dispense:  16 g    Refill:  3    Order Specific Question:   Supervising Provider    Answer:   Lucille Passy [[id number]]  . guaiFENesin (MUCINEX) 600 MG 12 hr tablet    Sig: Take 1 tablet (600 mg total) 2 (two) times daily as needed by mouth for cough or to loosen phlegm.    Dispense:  14 tablet    Refill:  0    Order Specific Question:   Supervising Provider    Answer:   Lucille Passy [[id number]]  . predniSONE (DELTASONE) 20 MG tablet    Sig: Take 2 tablets (40 mg total) daily with breakfast by mouth.    Dispense:  8 tablet    Refill:  0    Order Specific Question:   Supervising Provider    Answer:   Lucille Passy [[id number]]  . benzonatate (TESSALON) 100 MG capsule    Sig: Take 1 capsule (100 mg total) 3 (three) times daily as needed by mouth for cough.    Dispense:  20 capsule    Refill:  0    Order Specific  Question:   Supervising Provider    Answer:   Lucille Passy [  [id number]]  . sodium chloride (OCEAN) 0.65 % SOLN nasal spray    Sig: Place 1 spray as needed into both nostrils for congestion.    Dispense:  15 mL    Refill:  0    Order Specific Question:   Supervising Provider    Answer:   Lucille Passy [[id number]]  . traZODone (DESYREL) 50 MG tablet    Sig: Take 1 tablet (50 mg total) at bedtime by mouth.    Dispense:  30 tablet    Refill:  0    Order Specific Question:   Supervising Provider    Answer:   Lucille Passy [[id number]]  . sertraline (ZOLOFT) 100 MG tablet    Sig: Take 1 tablet (100 mg total) daily by mouth.    Dispense:  90 tablet    Refill:  1    Order Specific Question:   Supervising Provider    Answer:   Lucille Passy [[id number]]  . amoxicillin-clavulanate (AUGMENTIN) 875-125 MG tablet    Sig: Take 1 tablet 2 (two) times daily by mouth.    Dispense:  14 tablet    Refill:  0    Order Specific Question:   Supervising Provider    Answer:   Lucille Passy [[id number]]  . lisinopril (PRINIVIL,ZESTRIL) 2.5 MG tablet    Sig: Take 1 tablet (2.5 mg total) daily by mouth.    Dispense:  90 tablet    Refill:  1    Order Specific Question:   Supervising Provider    Answer:   Lucille Passy [[id number]]  . amLODipine (NORVASC) 5 MG tablet    Sig: Take 1 tablet (5 mg total) daily by mouth.    Dispense:  90 tablet    Refill:  1    Order Specific Question:   Supervising Provider    Answer:   Lucille Passy [[id number]]  . albuterol (PROVENTIL HFA;VENTOLIN HFA) 108 (90 Base) MCG/ACT inhaler    Sig: Inhale 2 puffs every 6 (six) hours as needed into the lungs for wheezing or shortness of breath.    Dispense:  1 Inhaler    Refill:  2    Order Specific Question:   Supervising Provider    Answer:   Lucille Passy [[id number]]  . esomeprazole (NEXIUM) 20 MG capsule    Sig: Take 1 capsule (20 mg total) daily by mouth.    Dispense:  90 capsule    Refill:  1    Order Specific Question:   Supervising Provider    Answer:   Lucille Passy [[id number]]  . glipiZIDE (GLUCOTROL XL) 5 MG 24 hr tablet    Sig: Take 1 tablet (5 mg total) daily with breakfast by mouth.    Dispense:  90 tablet    Refill:  1    Order Specific Question:   Supervising Provider    Answer:   Lucille Passy [[id number]]  . pravastatin (PRAVACHOL) 40 MG tablet    Sig: Take 1 tablet (40 mg total) daily by mouth.    Dispense:  90 tablet    Refill:  1    Order Specific Question:   Supervising Provider    Answer:   Lucille Passy [[id number]]   Follow-up: Return in about 4 weeks (around 01/27/2017) for depression.  Wilfred Lacy, NP

## 2016-12-30 NOTE — Patient Instructions (Addendum)
Decrease zoloft to 100mg  once a day.  Stop use of klonopin.  Start trazodone once a day at bedtime.  Please make appointment with pscychology. Bring FMLA paperworks after firat appointment with pschologist.  Encourage regular exercise and heart healthy diet.  mproved HgbA1c from 9.2 to 6.9. Continue current DM medication. Also incorporate healthy diet and regular exercise. Mild protein in urine which is an effect of DM of renal function. Added low dose lisinopril to protect kidney.

## 2016-12-30 NOTE — Assessment & Plan Note (Signed)
Uncontrolled symptoms (insomnia, racing thoughts, lack of motivation to exercise or diet). No SI or HI, no ETOH or illicit substance use. Encourage patient to make appt with psychologist. Added trazodone 50mg  Stop klonopin prn Decrease zoloft to 100mg . F/up in 58month.

## 2016-12-31 MED ORDER — GLIPIZIDE ER 5 MG PO TB24: 5.0000 mg | ORAL_TABLET | Freq: Every day | ORAL | 1 refills | Status: DC

## 2016-12-31 MED ORDER — PRAVASTATIN SODIUM 40 MG PO TABS: 40.0000 mg | ORAL_TABLET | Freq: Every day | ORAL | 1 refills | Status: DC

## 2016-12-31 MED ORDER — ESOMEPRAZOLE MAGNESIUM 20 MG PO CPDR: 20.0000 mg | DELAYED_RELEASE_CAPSULE | Freq: Every day | ORAL | 1 refills | Status: DC

## 2016-12-31 MED ORDER — LISINOPRIL 2.5 MG PO TABS: 2.5000 mg | ORAL_TABLET | Freq: Every day | ORAL | 1 refills | Status: DC

## 2016-12-31 MED ORDER — AMLODIPINE BESYLATE 5 MG PO TABS: 5.0000 mg | ORAL_TABLET | Freq: Every day | ORAL | 1 refills | Status: DC

## 2016-12-31 MED ORDER — ALBUTEROL SULFATE HFA 108 (90 BASE) MCG/ACT IN AERS: 2.0000 | INHALATION_SPRAY | Freq: Four times a day (QID) | RESPIRATORY_TRACT | 2 refills | Status: DC | PRN

## 2017-01-08 ENCOUNTER — Encounter: Payer: Self-pay | Admitting: Nurse Practitioner

## 2017-01-10 ENCOUNTER — Other Ambulatory Visit: Payer: Self-pay | Admitting: Nurse Practitioner

## 2017-01-10 DIAGNOSIS — Z1231 Encounter for screening mammogram for malignant neoplasm of breast: Secondary | ICD-10-CM

## 2017-01-17 ENCOUNTER — Ambulatory Visit: Payer: Self-pay

## 2017-01-24 ENCOUNTER — Telehealth: Payer: Self-pay | Admitting: Nurse Practitioner

## 2017-01-24 NOTE — Telephone Encounter (Signed)
Pt is requesting Clonazepam instead of Trazodone.

## 2017-01-24 NOTE — Telephone Encounter (Signed)
Trazodone is to be used at bedtime only. I will not be refilling klonopin at this time.

## 2017-01-24 NOTE — Telephone Encounter (Signed)
Left vm for the pt to call back, need to inform massage below. Baldo Ash gave her trazodone to take it at night time and discontinue klonopin so she will not refill this med at this time.

## 2017-01-24 NOTE — Telephone Encounter (Signed)
Copied from [place]. Topic: Quick Communication - See Telephone Encounter >> Jan 24, 2017  1:15 PM Boyd Kerbs wrote: CRM for notification. See Telephone encounter for:   Patient called wants to go back to the Clonazepam, as the Trazodone makes her sleepy during the day. Asking for prescription for the Clonazepam. And a Refill Ibuprofen 600 mg   [id number] [address], [place] - [place] [address] Phone: [phone] Fax: [phone]   01/24/17.

## 2017-01-28 ENCOUNTER — Telehealth: Payer: Self-pay | Admitting: Nurse Practitioner

## 2017-01-28 MED ORDER — IBUPROFEN 600 MG PO TABS: 600.0000 mg | ORAL_TABLET | Freq: Three times a day (TID) | ORAL | 0 refills | Status: DC | PRN

## 2017-01-28 NOTE — Telephone Encounter (Signed)
Pt is aware of massage below and rx for ibuprofen 600 sent in for pt.

## 2017-01-28 NOTE — Telephone Encounter (Signed)
Pt would like to change medication ; last office visit 12/28/16; will route to [facility].

## 2017-01-28 NOTE — Telephone Encounter (Signed)
Please instruct patient to cut trazodone table in half. Therefore she will be taking 25mg  at HS

## 2017-01-28 NOTE — Telephone Encounter (Signed)
Spoke with pt and she said that seem like the trazodone is too strong because in the morning its making feel sleepy. Pt has an appt on 01/31/2017.   Pt also req refill for ibuprofen 600mg ? Please advise.

## 2017-01-28 NOTE — Addendum Note (Signed)
Addended byShawnie Pons on: 01/28/2017 11:43 AM   Modules accepted: Orders

## 2017-01-31 ENCOUNTER — Ambulatory Visit: Payer: Self-pay | Admitting: Nurse Practitioner

## 2017-02-14 ENCOUNTER — Other Ambulatory Visit: Payer: Self-pay | Admitting: Nurse Practitioner

## 2017-02-14 MED ORDER — IBUPROFEN 600 MG PO TABS: 600.0000 mg | ORAL_TABLET | Freq: Three times a day (TID) | ORAL | 0 refills | Status: DC | PRN

## 2017-02-19 ENCOUNTER — Ambulatory Visit: Payer: BLUE CROSS/BLUE SHIELD | Admitting: Family Medicine

## 2017-02-24 ENCOUNTER — Ambulatory Visit: Payer: BLUE CROSS/BLUE SHIELD | Admitting: Family Medicine

## 2017-02-28 ENCOUNTER — Other Ambulatory Visit: Payer: Self-pay | Admitting: Internal Medicine

## 2017-02-28 NOTE — Telephone Encounter (Signed)
Please advise, we never send ths med in for her. Brenda [name] as was the last one who made note on 07/2016

## 2017-02-28 NOTE — Telephone Encounter (Signed)
She is on nexium at this time. Does not need pantoprazole

## 2017-04-17 ENCOUNTER — Other Ambulatory Visit: Payer: Self-pay | Admitting: Nurse Practitioner

## 2017-04-17 DIAGNOSIS — F32A Depression, unspecified: Secondary | ICD-10-CM

## 2017-04-17 DIAGNOSIS — F329 Major depressive disorder, single episode, unspecified: Secondary | ICD-10-CM

## 2017-04-17 DIAGNOSIS — F411 Generalized anxiety disorder: Secondary | ICD-10-CM

## 2017-04-17 MED ORDER — TRAZODONE HCL 50 MG PO TABS: 50.0000 mg | ORAL_TABLET | Freq: Every day | ORAL | 0 refills | Status: DC

## 2017-04-17 NOTE — Telephone Encounter (Signed)
Request for Protonix and trazodone refill.Protonix not previously filled by Wilfred Lacy.   LOV: 12/30/16  PCP: Am[facility] Coe  [facility] [address]

## 2017-04-17 NOTE — Telephone Encounter (Signed)
Copied from [place] [id number]. Topic: Quick Communication - Rx Refill/Question >> Apr 17, 2017  9:22 AM [name] wrote: Medication: pantoprazole (PROTONIX) 40 MG tablet traZODone (DESYREL) 50 MG tablet   Has the patient contacted their pharmacy? yes   (Agent: If no, request that the patient contact the pharmacy for the refill.)   Preferred Pharmacy (with phone number or street name): [facility] [address]   Agent: Please be advised that RX refills may take up to 3 business days. We ask that you follow-up with your pharmacy.

## 2017-04-17 NOTE — Telephone Encounter (Signed)
Ok to refill trazodone, 60tabs, no refill, no OV for additional refills.  Do not fill protonix, nexium was sent previously

## 2017-04-17 NOTE — Telephone Encounter (Signed)
Please advise, we never send in pantoprazole and pt was suppose to f/u with [facility] in 01/2017 for trazodone.

## 2017-04-17 NOTE — Telephone Encounter (Signed)
rx sent

## 2017-04-28 ENCOUNTER — Encounter: Payer: Self-pay | Admitting: Nurse Practitioner

## 2017-04-28 ENCOUNTER — Ambulatory Visit: Payer: BLUE CROSS/BLUE SHIELD | Admitting: Nurse Practitioner

## 2017-04-28 VITALS — BP 152/82 | HR 99 | Temp 98.0°F | Wt 150.4 lb

## 2017-04-28 DIAGNOSIS — K219 Gastro-esophageal reflux disease without esophagitis: Secondary | ICD-10-CM | POA: Diagnosis not present

## 2017-04-28 DIAGNOSIS — R0602 Shortness of breath: Secondary | ICD-10-CM | POA: Diagnosis not present

## 2017-04-28 DIAGNOSIS — J069 Acute upper respiratory infection, unspecified: Secondary | ICD-10-CM | POA: Diagnosis not present

## 2017-04-28 DIAGNOSIS — F32A Depression, unspecified: Secondary | ICD-10-CM

## 2017-04-28 DIAGNOSIS — F411 Generalized anxiety disorder: Secondary | ICD-10-CM

## 2017-04-28 DIAGNOSIS — R05 Cough: Secondary | ICD-10-CM

## 2017-04-28 DIAGNOSIS — E118 Type 2 diabetes mellitus with unspecified complications: Secondary | ICD-10-CM

## 2017-04-28 DIAGNOSIS — F329 Major depressive disorder, single episode, unspecified: Secondary | ICD-10-CM

## 2017-04-28 DIAGNOSIS — E7800 Pure hypercholesterolemia, unspecified: Secondary | ICD-10-CM

## 2017-04-28 DIAGNOSIS — N951 Menopausal and female climacteric states: Secondary | ICD-10-CM

## 2017-04-28 DIAGNOSIS — R059 Cough, unspecified: Secondary | ICD-10-CM

## 2017-04-28 MED ORDER — PANTOPRAZOLE SODIUM 40 MG PO TBEC: 40.0000 mg | DELAYED_RELEASE_TABLET | Freq: Every day | ORAL | 1 refills | Status: DC

## 2017-04-28 MED ORDER — RANITIDINE HCL 150 MG PO TABS
300.0000 mg | ORAL_TABLET | Freq: Every day | ORAL | 1 refills | Status: DC
Start: 2017-04-28 — End: 2018-05-12

## 2017-04-28 MED ORDER — VENLAFAXINE HCL ER 75 MG PO CP24: 75.0000 mg | ORAL_CAPSULE | Freq: Every day | ORAL | 0 refills | Status: DC

## 2017-04-28 MED ORDER — FLUTICASONE PROPIONATE 50 MCG/ACT NA SUSP
2.0000 | Freq: Every day | NASAL | 3 refills | Status: DC
Start: 2017-04-28 — End: 2020-12-29

## 2017-04-28 NOTE — Patient Instructions (Addendum)
Stop zoloft and trazodone. Start effexor tomorrow.  Go to lab for blood draw and CXR.  you will be called with results. Will send other medication refills after review of labs.  Will refer to ENT if CXR normal.

## 2017-04-28 NOTE — Progress Notes (Signed)
Subjective:  Patient ID: Brenda [name], female    DOB: 20-Jun-1965  Age: 52 y.o. MRN: [id number]  CC: Cough (has a cough that wont go away., 3 weeks, produces yellow mucous, OTC ) and Medication Refill (med refill?)   Cough  This is a recurrent problem. The current episode started 1 to 4 weeks ago. The problem has been waxing and waning. The problem occurs constantly. The cough is productive of sputum. Associated symptoms include heartburn, postnasal drip, rhinorrhea and shortness of breath. Pertinent negatives include no headaches, myalgias, sweats, weight loss or wheezing. The symptoms are aggravated by lying down. She has tried oral steroids, OTC cough suppressant and prescription cough suppressant for the symptoms. Her past medical history is significant for environmental allergies.  Depression         This is a chronic problem.  The current episode started more than 1 year ago.   The onset quality is gradual.   The problem occurs constantly.The problem is unchanged.  Associated symptoms include decreased concentration, fatigue, decreased interest, body aches, indigestion and sad.  Associated symptoms include does not have insomnia, not irritable, no myalgias, no headaches and no suicidal ideas.  Improved with augmentin, prednisone and promethazine DM. Prescribed by urgent care provider. Has persistent hoarseness.  DM: does not check glucose at home. Denies any adverse effects with glipizide.  Outpatient Medications Prior to Visit  Medication Sig Dispense Refill  . albuterol (PROVENTIL HFA;VENTOLIN HFA) 108 (90 Base) MCG/ACT inhaler Inhale 2 puffs every 6 (six) hours as needed into the lungs for wheezing or shortness of breath. 1 Inhaler 2  . amLODipine (NORVASC) 5 MG tablet Take 1 tablet (5 mg total) daily by mouth. 90 tablet 1  . blood glucose meter kit and supplies KIT Dispense based on patient and insurance preference.  Use before breakfast and at bedtime. E11.4 1 each 1  .  estradiol (VIVELLE-DOT) 0.05 MG/24HR patch Place 1 patch (0.05 mg total) onto the skin 2 (two) times a week. 8 patch 12  . glipiZIDE (GLUCOTROL XL) 5 MG 24 hr tablet Take 1 tablet (5 mg total) daily with breakfast by mouth. 90 tablet 1  . guaiFENesin (MUCINEX) 600 MG 12 hr tablet Take 1 tablet (600 mg total) 2 (two) times daily as needed by mouth for cough or to loosen phlegm. 14 tablet 0  . ibuprofen (ADVIL,MOTRIN) 600 MG tablet Take 1 tablet (600 mg total) by mouth every 8 (eight) hours as needed. With food. 90 tablet 0  . Ibuprofen-Famotidine 800-26.6 MG TABS Take 1 tablet by mouth 3 (three) times daily as needed. 90 tablet 1  . lisinopril (PRINIVIL,ZESTRIL) 2.5 MG tablet Take 1 tablet (2.5 mg total) daily by mouth. 90 tablet 1  . pravastatin (PRAVACHOL) 40 MG tablet Take 1 tablet (40 mg total) daily by mouth. 90 tablet 1  . promethazine-dextromethorphan (PROMETHAZINE-DM) 6.25-15 MG/5ML syrup TAKE 1 TEASPOON(5ML) BY MOUTH EVERY 6 HOURS AS NEEDED FOR 7 DAYS  0  . sodium chloride (OCEAN) 0.65 % SOLN nasal spray Place 1 spray as needed into both nostrils for congestion. 15 mL 0  . triamcinolone cream (KENALOG) 0.1 % Apply 1 application topically 2 (two) times daily. 30 g 3  . benzonatate (TESSALON) 100 MG capsule Take 1 capsule (100 mg total) 3 (three) times daily as needed by mouth for cough. 20 capsule 0  . fluticasone (FLONASE) 50 MCG/ACT nasal spray Place 2 sprays daily into both nostrils. 16 g 3  . pantoprazole (PROTONIX) 40 MG  tablet     . predniSONE (DELTASONE) 20 MG tablet Take 2 tablets (40 mg total) daily with breakfast by mouth. 8 tablet 0  . sertraline (ZOLOFT) 100 MG tablet Take 1 tablet (100 mg total) daily by mouth. 90 tablet 1  . traZODone (DESYREL) 50 MG tablet Take 1 tablet (50 mg total) by mouth at bedtime. 60 tablet 0  . amoxicillin-clavulanate (AUGMENTIN) 875-125 MG tablet Take 1 tablet 2 (two) times daily by mouth. (Patient not taking: Reported on 04/28/2017) 14 tablet 0  .  esomeprazole (NEXIUM) 20 MG capsule Take 1 capsule (20 mg total) daily by mouth. (Patient not taking: Reported on 04/28/2017) 90 capsule 1   No facility-administered medications prior to visit.     ROS See HPI  Objective:  BP (!) 152/82   Pulse 99   Temp 98 F (36.7 C)   Wt 150 lb 6.4 oz (68.2 kg)   LMP 11/26/2013   SpO2 98%   BMI 27.51 kg/m   BP Readings from Last 3 Encounters:  04/28/17 (!) 152/82  12/30/16 116/82  09/30/16 (!) 164/86    Wt Readings from Last 3 Encounters:  04/28/17 150 lb 6.4 oz (68.2 kg)  12/30/16 149 lb (67.6 kg)  09/30/16 149 lb (67.6 kg)    Physical Exam  Constitutional: She is oriented to person, place, and time. She is not irritable. No distress.  HENT:  Left Ear: External ear normal.  Mouth/Throat: Oropharynx is clear and moist.  Neck: Normal range of motion. Neck supple. No thyromegaly present.  Cardiovascular: Normal rate, regular rhythm and normal heart sounds.  Pulmonary/Chest: Effort normal and breath sounds normal.  Abdominal: Soft. Bowel sounds are normal.  Lymphadenopathy:    She has no cervical adenopathy.  Neurological: She is alert and oriented to person, place, and time.  Psychiatric: She has a normal mood and affect. Her behavior is normal.  Vitals reviewed.   Lab Results  Component Value Date   WBC 7.2 07/15/2016   HGB 12.3 07/15/2016   HCT 37.4 07/15/2016   PLT 362.0 07/15/2016   GLUCOSE 251 (H) 07/15/2016   CHOL 194 07/15/2016   TRIG 75.0 07/15/2016   HDL 56.60 07/15/2016   LDLDIRECT 131.3 02/15/2011   LDLCALC 122 (H) 07/15/2016   ALT 12 07/15/2016   AST 11 07/15/2016   NA 137 07/15/2016   K 4.3 07/15/2016   CL 100 07/15/2016   CREATININE 0.56 07/15/2016   BUN 12 07/15/2016   CO2 29 07/15/2016   TSH 1.54 10/17/2015   HGBA1C 6.9 (H) 12/30/2016   MICROALBUR 9.8 (H) 12/30/2016    Dg Knee Complete 4 Views Right  Result Date: 12/26/2015 CLINICAL DATA:  52 y/o F; rule out osteoarthrosis. Generalized pain  for 2 months. EXAM: RIGHT KNEE - COMPLETE 4+ VIEW COMPARISON:  06/21/2012 right knee radiographs. FINDINGS: No evidence of fracture, dislocation, or joint effusion. No evidence of arthropathy or other focal bone abnormality. Superior and inferior patellar enthesophytes. Tiny periarticular osteophytes of the patellofemoral compartment. IMPRESSION: No significant joint space narrowing. Tiny osteophytes of the patellofemoral compartment. Electronically Signed   By: Kristine Garbe M.D.   On: 12/26/2015 16:10    Assessment & Plan:   Takira was seen today for cough and medication refill.  Diagnoses and all orders for this visit:  Depression, unspecified depression type -     Ambulatory referral to Psychiatry -     venlafaxine XR (EFFEXOR XR) 75 MG 24 hr capsule; Take 1 capsule (75 mg total)  by mouth daily with breakfast. -     Comprehensive metabolic panel; Future  ANXIETY DISORDER, GENERALIZED -     Ambulatory referral to Psychiatry -     venlafaxine XR (EFFEXOR XR) 75 MG 24 hr capsule; Take 1 capsule (75 mg total) by mouth daily with breakfast. -     Comprehensive metabolic panel; Future -     TSH; Future  Type 2 diabetes mellitus with complication, without long-term current use of insulin (HCC) -     Cancel: Comprehensive metabolic panel -     Cancel: Hemoglobin A1c -     Comprehensive metabolic panel; Future -     Hemoglobin A1c; Future -     Microalbumin / creatinine urine ratio; Future  Hot flashes due to menopause -     Cancel: TSH -     TSH; Future  Cough -     DG Chest 2 View; Future  SOB (shortness of breath) -     DG Chest 2 View; Future  HYPERCHOLESTEROLEMIA -     Cancel: Lipid panel -     Comprehensive metabolic panel; Future -     Lipid panel; Future  Gastroesophageal reflux disease without esophagitis -     pantoprazole (PROTONIX) 40 MG tablet; Take 1 tablet (40 mg total) by mouth daily. -     ranitidine (ZANTAC) 150 MG tablet; Take 2 tablets (300 mg  total) by mouth at bedtime.  Acute URI -     fluticasone (FLONASE) 50 MCG/ACT nasal spray; Place 2 sprays into both nostrils daily.   I have discontinued Corrissa A. Maniscalco's predniSONE, benzonatate, sertraline, amoxicillin-clavulanate, esomeprazole, and traZODone. I have also changed her pantoprazole and fluticasone. Additionally, I am having her start on venlafaxine XR and ranitidine. Lastly, I am having her maintain her Ibuprofen-Famotidine, triamcinolone cream, estradiol, blood glucose meter kit and supplies, guaiFENesin, sodium chloride, lisinopril, amLODipine, albuterol, glipiZIDE, pravastatin, ibuprofen, and promethazine-dextromethorphan.  Meds ordered this encounter  Medications  . venlafaxine XR (EFFEXOR XR) 75 MG 24 hr capsule    Sig: Take 1 capsule (75 mg total) by mouth daily with breakfast.    Dispense:  30 capsule    Refill:  0    Order Specific Question:   Supervising Provider    Answer:   Lucille Passy [[id number]]  . pantoprazole (PROTONIX) 40 MG tablet    Sig: Take 1 tablet (40 mg total) by mouth daily.    Dispense:  90 tablet    Refill:  1    Order Specific Question:   Supervising Provider    Answer:   Lucille Passy [[id number]]  . ranitidine (ZANTAC) 150 MG tablet    Sig: Take 2 tablets (300 mg total) by mouth at bedtime.    Dispense:  90 tablet    Refill:  1    Order Specific Question:   Supervising Provider    Answer:   Lucille Passy [[id number]]  . fluticasone (FLONASE) 50 MCG/ACT nasal spray    Sig: Place 2 sprays into both nostrils daily.    Dispense:  16 g    Refill:  3    Order Specific Question:   Supervising Provider    Answer:   Lucille Passy [[id number]]    Follow-up: Return in about 2 weeks (around 05/12/2017) for CPE (fasting) and anxiety re eval.  Wilfred Lacy, NP

## 2017-04-28 NOTE — Assessment & Plan Note (Signed)
Unable to tolerate trazodone. Current use of zoloft only. Complains of persistent anxiety and depression. Did not make appt with psychology. Complains of hot flashes not controlled by estradiol twice a week. D/c zoloft and trazodone. Start effexor. Referral to psychiatry for long term benzodiazepine use.

## 2017-04-28 NOTE — Assessment & Plan Note (Addendum)
Unable to tolerate trazodone. Current use of zoloft only. Complains of persistent anxiety and depression. Did not make appt with psychology. Complains of hot flashes not controlled by estradiol twice a week. D/c zoloft and trazodone. Start effexor. Referral to psychiatry for long term benzodiazepine use.

## 2017-04-29 ENCOUNTER — Ambulatory Visit: Payer: Self-pay

## 2017-04-29 ENCOUNTER — Other Ambulatory Visit: Payer: Self-pay

## 2017-04-30 ENCOUNTER — Encounter: Payer: Self-pay | Admitting: Nurse Practitioner

## 2017-04-30 ENCOUNTER — Other Ambulatory Visit: Payer: Self-pay

## 2017-04-30 ENCOUNTER — Ambulatory Visit: Payer: Self-pay

## 2017-05-01 ENCOUNTER — Telehealth: Payer: Self-pay | Admitting: Nurse Practitioner

## 2017-05-01 ENCOUNTER — Ambulatory Visit (INDEPENDENT_AMBULATORY_CARE_PROVIDER_SITE_OTHER): Payer: BLUE CROSS/BLUE SHIELD

## 2017-05-01 ENCOUNTER — Other Ambulatory Visit (INDEPENDENT_AMBULATORY_CARE_PROVIDER_SITE_OTHER): Payer: BLUE CROSS/BLUE SHIELD

## 2017-05-01 ENCOUNTER — Ambulatory Visit: Payer: BLUE CROSS/BLUE SHIELD

## 2017-05-01 DIAGNOSIS — F411 Generalized anxiety disorder: Secondary | ICD-10-CM | POA: Diagnosis not present

## 2017-05-01 DIAGNOSIS — E7800 Pure hypercholesterolemia, unspecified: Secondary | ICD-10-CM | POA: Diagnosis not present

## 2017-05-01 DIAGNOSIS — F329 Major depressive disorder, single episode, unspecified: Secondary | ICD-10-CM

## 2017-05-01 DIAGNOSIS — E118 Type 2 diabetes mellitus with unspecified complications: Secondary | ICD-10-CM

## 2017-05-01 DIAGNOSIS — F32A Depression, unspecified: Secondary | ICD-10-CM

## 2017-05-01 DIAGNOSIS — R05 Cough: Secondary | ICD-10-CM | POA: Diagnosis not present

## 2017-05-01 DIAGNOSIS — N951 Menopausal and female climacteric states: Secondary | ICD-10-CM

## 2017-05-01 DIAGNOSIS — R059 Cough, unspecified: Secondary | ICD-10-CM

## 2017-05-01 DIAGNOSIS — R0602 Shortness of breath: Secondary | ICD-10-CM | POA: Diagnosis not present

## 2017-05-01 LAB — TSH: TSH: 1.14 u[IU]/mL (ref 0.35–4.50)

## 2017-05-01 LAB — MICROALBUMIN / CREATININE URINE RATIO
Creatinine,U: 188.1 mg/dL
MICROALB UR: 10.1 mg/dL — AB (ref 0.0–1.9)
Microalb Creat Ratio: 5.4 mg/g (ref 0.0–30.0)

## 2017-05-01 LAB — HEMOGLOBIN A1C: Hgb A1c MFr Bld: 7.4 % — ABNORMAL HIGH (ref 4.6–6.5)

## 2017-05-01 NOTE — Telephone Encounter (Signed)
I will not provide work note for 05/01/2017 You can create another work note for 04/30/2017 only

## 2017-05-01 NOTE — Telephone Encounter (Signed)
Pt came in to do lab and x-ray today. Pt request extended out of work note for 04/30/17 and 05/01/17 as well (we wrote out of work 04/28/17-04/29/17). Pt stated she still has bad cough after finish abx, nothing helping. Please advise.

## 2017-05-02 ENCOUNTER — Encounter: Payer: Self-pay | Admitting: Nurse Practitioner

## 2017-05-02 LAB — COMPREHENSIVE METABOLIC PANEL
ALK PHOS: 58 U/L (ref 39–117)
ALT: 14 U/L (ref 0–35)
AST: 11 U/L (ref 0–37)
Albumin: 4.6 g/dL (ref 3.5–5.2)
BUN: 10 mg/dL (ref 6–23)
CALCIUM: 9.7 mg/dL (ref 8.4–10.5)
CO2: 25 mEq/L (ref 19–32)
Chloride: 102 mEq/L (ref 96–112)
Creatinine, Ser: 0.56 mg/dL (ref 0.40–1.20)
GFR: 145.21 mL/min (ref >60.00)
GLUCOSE: 242 mg/dL — AB (ref 70–99)
POTASSIUM: 4.7 meq/L (ref 3.5–5.1)
Sodium: 137 mEq/L (ref 135–145)
TOTAL PROTEIN: 7.9 g/dL (ref 6.0–8.3)
Total Bilirubin: 0.4 mg/dL (ref 0.2–1.2)

## 2017-05-02 LAB — LIPID PANEL
CHOLESTEROL: 196 mg/dL (ref 0–200)
HDL: 59.3 mg/dL (ref >39.00)
LDL CALC: 117 mg/dL — AB (ref 0–99)
NONHDL: 136.65
Total CHOL/HDL Ratio: 3
Triglycerides: 98 mg/dL (ref 0.0–149.0)
VLDL: 19.6 mg/dL (ref 0.0–40.0)

## 2017-05-02 NOTE — Telephone Encounter (Signed)
Left vm for the pt to call back, need to inform her of out of work note from [place]. Letter up front.

## 2017-05-02 NOTE — Telephone Encounter (Signed)
Pt verbalize understand. Letter up front to pick up.

## 2017-07-09 ENCOUNTER — Telehealth: Payer: Self-pay | Admitting: Nurse Practitioner

## 2017-07-09 DIAGNOSIS — F32A Depression, unspecified: Secondary | ICD-10-CM

## 2017-07-09 DIAGNOSIS — J069 Acute upper respiratory infection, unspecified: Secondary | ICD-10-CM

## 2017-07-09 DIAGNOSIS — E7800 Pure hypercholesterolemia, unspecified: Secondary | ICD-10-CM

## 2017-07-09 DIAGNOSIS — F329 Major depressive disorder, single episode, unspecified: Secondary | ICD-10-CM

## 2017-07-09 DIAGNOSIS — F411 Generalized anxiety disorder: Secondary | ICD-10-CM

## 2017-07-09 DIAGNOSIS — E118 Type 2 diabetes mellitus with unspecified complications: Secondary | ICD-10-CM

## 2017-07-09 DIAGNOSIS — I10 Essential (primary) hypertension: Secondary | ICD-10-CM

## 2017-07-09 MED ORDER — ALBUTEROL SULFATE HFA 108 (90 BASE) MCG/ACT IN AERS: 2.0000 | INHALATION_SPRAY | Freq: Four times a day (QID) | RESPIRATORY_TRACT | 0 refills | Status: DC | PRN

## 2017-07-09 MED ORDER — PRAVASTATIN SODIUM 40 MG PO TABS: 40.0000 mg | ORAL_TABLET | Freq: Every day | ORAL | 0 refills | Status: DC

## 2017-07-09 MED ORDER — LISINOPRIL 2.5 MG PO TABS: 2.5000 mg | ORAL_TABLET | Freq: Every day | ORAL | 0 refills | Status: DC

## 2017-07-09 MED ORDER — AMLODIPINE BESYLATE 5 MG PO TABS: 5.0000 mg | ORAL_TABLET | Freq: Every day | ORAL | 0 refills | Status: DC

## 2017-07-09 MED ORDER — ESTRADIOL 0.05 MG/24HR TD PTTW: 1.0000 | MEDICATED_PATCH | TRANSDERMAL | 0 refills | Status: DC

## 2017-07-09 NOTE — Telephone Encounter (Unsigned)
Copied from [place] [id number]. Topic: Quick Communication - Rx Refill/Question >> Jul 09, 2017 11:11 AM Neva Seat wrote:  albuterol (PROVENTIL HFA;VENTOLIN HFA) 108 (90 Base) MCG/ACT inhaler   amLODipine (NORVASC) 5 MG tablet estradiol (VIVELLE-DOT) 0.05 MG/24HR patch    lisinopril (PRINIVIL,ZESTRIL) 2.5 MG tablet   pantoprazole (PROTONIX) 40 MG tablet   pravastatin (PRAVACHOL) 40 MG tablet   promethazine-dextromethorphan (PROMETHAZINE-DM) 6.25-15 MG/5ML syrup     triamcinolone cream (KENALOG) 0.1 %   venlafaxine XR (EFFEXOR XR) 75 MG 24 hr capsule   Pt wanting refills on all medications above.   [id number] - [place], [place] [address] Phone: [phone] Fax: [phone]

## 2017-07-09 NOTE — Telephone Encounter (Signed)
Patient called, left VM to return call to the office to a TN to discuss symptoms needing the Promethazine-DM cough syrup refill. Patient also needs a CPE-Physical scheduled as noted in the last OV note on 04/28/17.   Promethazine DM and Effexor refill Last OV:04/28/17 Last refill: Effexor 04/28/17 30 tab/0 refill; Promethazine 04/28/17 0 refills TDD:[facility] Pharmacy:  [id number] - [place], [place] [phone] (Phone) [phone] (Fax)

## 2017-07-10 ENCOUNTER — Other Ambulatory Visit: Payer: Self-pay

## 2017-07-10 DIAGNOSIS — E118 Type 2 diabetes mellitus with unspecified complications: Secondary | ICD-10-CM

## 2017-07-10 MED ORDER — GLIPIZIDE ER 5 MG PO TB24: 5.0000 mg | ORAL_TABLET | Freq: Every day | ORAL | 0 refills | Status: DC

## 2017-07-10 NOTE — Telephone Encounter (Signed)
Left vm for the pt to call back, need to be seen to reevaluation for Effexor XR and address for cough. Waiting for the pt to call back.

## 2017-07-10 NOTE — Telephone Encounter (Signed)
Pt needing glipiZIDE (GLUCOTROL XL) 5 MG 24 hr tablet   sent in for her. She has scheduled appt with Dr. Ethelene Hal 07/14/17 since Baldo Ash is out of office. Pt also requesting trazodone and ibuprofen 600mg . I advised she will likely need to see provider to get those refilled.  Send any of above to [id number] - [place], [place]

## 2017-07-10 NOTE — Telephone Encounter (Signed)
Refill sent to pharmacy. Pt needs an appt for future refills. TLG

## 2017-07-14 ENCOUNTER — Ambulatory Visit: Payer: BLUE CROSS/BLUE SHIELD | Admitting: Family Medicine

## 2017-07-23 ENCOUNTER — Other Ambulatory Visit: Payer: Self-pay | Admitting: Nurse Practitioner

## 2017-07-23 DIAGNOSIS — E7800 Pure hypercholesterolemia, unspecified: Secondary | ICD-10-CM

## 2017-07-23 DIAGNOSIS — I10 Essential (primary) hypertension: Secondary | ICD-10-CM

## 2017-07-23 NOTE — Telephone Encounter (Signed)
left vm for the pt to call back, need to schedule an CPE appt first before refill can be send in.

## 2017-07-24 ENCOUNTER — Other Ambulatory Visit: Payer: Self-pay | Admitting: Nurse Practitioner

## 2017-07-24 DIAGNOSIS — E118 Type 2 diabetes mellitus with unspecified complications: Secondary | ICD-10-CM

## 2017-07-25 MED ORDER — PRAVASTATIN SODIUM 40 MG PO TABS: 40.0000 mg | ORAL_TABLET | Freq: Every day | ORAL | 0 refills | Status: DC

## 2017-07-25 MED ORDER — AMLODIPINE BESYLATE 5 MG PO TABS: 5.0000 mg | ORAL_TABLET | Freq: Every day | ORAL | 0 refills | Status: DC

## 2017-07-25 NOTE — Telephone Encounter (Signed)
Pt need to see PCP fore more refills on BP med

## 2017-08-04 ENCOUNTER — Other Ambulatory Visit: Payer: Self-pay | Admitting: Family Medicine

## 2017-08-04 DIAGNOSIS — E118 Type 2 diabetes mellitus with unspecified complications: Secondary | ICD-10-CM

## 2017-08-04 MED ORDER — GLIPIZIDE ER 5 MG PO TB24: ORAL_TABLET | ORAL | 1 refills | Status: DC

## 2017-08-04 MED ORDER — ESTRADIOL 0.05 MG/24HR TD PTTW: MEDICATED_PATCH | TRANSDERMAL | 1 refills | Status: DC

## 2017-08-04 NOTE — Telephone Encounter (Signed)
Rx resend, last refill fail to send.

## 2017-08-15 ENCOUNTER — Other Ambulatory Visit: Payer: Self-pay | Admitting: Internal Medicine

## 2017-08-18 ENCOUNTER — Other Ambulatory Visit: Payer: Self-pay | Admitting: Internal Medicine

## 2017-08-26 ENCOUNTER — Telehealth: Payer: Self-pay | Admitting: Nurse Practitioner

## 2017-08-26 NOTE — Telephone Encounter (Signed)
Left patient vm to call back to transfer to [place] or [place].

## 2017-08-26 NOTE — Telephone Encounter (Signed)
Ok to transfer. 

## 2017-08-26 NOTE — Telephone Encounter (Signed)
Please help call the pt, not sure who is taking new or transfer pt from [place] to transfer to [place] location [place]? Please advise.     Copied from [place] [id number]. Topic: Appointment Scheduling - Scheduling Inquiry for Clinic >> Aug 26, 2017  8:54 AM Lennox Solders wrote: Reason for CRM: pt would like to switch to [place] location and see  NP

## 2017-09-13 ENCOUNTER — Emergency Department (HOSPITAL_COMMUNITY)
Admission: EM | Admit: 2017-09-13 | Discharge: 2017-09-13 | Disposition: A | Discharge status: Home / Self Care | Payer: BLUE CROSS/BLUE SHIELD | Attending: Emergency Medicine | Admitting: Emergency Medicine

## 2017-09-13 ENCOUNTER — Encounter (HOSPITAL_COMMUNITY): Payer: Self-pay | Admitting: Emergency Medicine

## 2017-09-13 ENCOUNTER — Other Ambulatory Visit: Payer: Self-pay

## 2017-09-13 DIAGNOSIS — Y92009 Unspecified place in unspecified non-institutional (private) residence as the place of occurrence of the external cause: Secondary | ICD-10-CM | POA: Diagnosis not present

## 2017-09-13 DIAGNOSIS — Y939 Activity, unspecified: Secondary | ICD-10-CM | POA: Diagnosis not present

## 2017-09-13 DIAGNOSIS — Z7984 Long term (current) use of oral hypoglycemic drugs: Secondary | ICD-10-CM | POA: Diagnosis not present

## 2017-09-13 DIAGNOSIS — Y999 Unspecified external cause status: Secondary | ICD-10-CM | POA: Insufficient documentation

## 2017-09-13 DIAGNOSIS — I10 Essential (primary) hypertension: Secondary | ICD-10-CM | POA: Diagnosis not present

## 2017-09-13 DIAGNOSIS — Z23 Encounter for immunization: Secondary | ICD-10-CM | POA: Insufficient documentation

## 2017-09-13 DIAGNOSIS — S51812A Laceration without foreign body of left forearm, initial encounter: Secondary | ICD-10-CM | POA: Diagnosis not present

## 2017-09-13 DIAGNOSIS — X788XXA Intentional self-harm by other sharp object, initial encounter: Secondary | ICD-10-CM | POA: Insufficient documentation

## 2017-09-13 DIAGNOSIS — E119 Type 2 diabetes mellitus without complications: Secondary | ICD-10-CM | POA: Insufficient documentation

## 2017-09-13 DIAGNOSIS — S51811A Laceration without foreign body of right forearm, initial encounter: Secondary | ICD-10-CM | POA: Insufficient documentation

## 2017-09-13 DIAGNOSIS — Z79899 Other long term (current) drug therapy: Secondary | ICD-10-CM | POA: Diagnosis not present

## 2017-09-13 HISTORY — DX: Type 2 diabetes mellitus without complications: E11.9

## 2017-09-13 HISTORY — DX: Essential (primary) hypertension: I10

## 2017-09-13 MED ORDER — TETANUS-DIPHTH-ACELL PERTUSSIS 5-2.5-18.5 LF-MCG/0.5 IM SUSP
0.5000 mL | Freq: Once | INTRAMUSCULAR | Status: AC
  Administered 2017-09-13: 0.5 mL via INTRAMUSCULAR
  Filled 2017-09-13: qty 0.5
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
## 2017-09-13 NOTE — ED Notes (Signed)
Care handoff to [facility]

## 2018-04-07 ENCOUNTER — Other Ambulatory Visit: Payer: Self-pay | Admitting: Family Medicine

## 2018-04-07 DIAGNOSIS — R1011 Right upper quadrant pain: Secondary | ICD-10-CM

## 2018-04-16 ENCOUNTER — Other Ambulatory Visit: Payer: Self-pay

## 2018-04-23 ENCOUNTER — Ambulatory Visit
Admission: RE | Admit: 2018-04-23 | Discharge: 2018-04-23 | Disposition: A | Discharge status: Home / Self Care | Payer: BLUE CROSS/BLUE SHIELD | Source: Ambulatory Visit | Attending: Family Medicine | Admitting: Family Medicine

## 2018-04-23 DIAGNOSIS — R1011 Right upper quadrant pain: Secondary | ICD-10-CM

## 2018-05-08 ENCOUNTER — Telehealth: Payer: Self-pay | Admitting: Gastroenterology

## 2018-05-08 NOTE — Telephone Encounter (Signed)
Patient is self referred.  Hemangioma on [place].  She is scheduled for an in office visit next week with Dr. Fuller Plan

## 2018-05-08 NOTE — Telephone Encounter (Signed)
Hi Brenda [name], this pt had an appt with Dr. Fuller Plan on 4/20 but she had an [place] that showed a cyst on her liver, pt stated that she has been experiencing pain and is requesting a sooner appt. She is willing to do webex. Can I move her up for a webex appt? Thank you.

## 2018-05-08 NOTE — Telephone Encounter (Signed)
Dr. Fuller Plan okay for Webex with you - Liver US shows cyst on liver

## 2018-05-08 NOTE — Telephone Encounter (Signed)
Since she is a new patient having abdominal pain need in person and referral records.

## 2018-05-12 ENCOUNTER — Encounter: Payer: Self-pay | Admitting: Gastroenterology

## 2018-05-12 ENCOUNTER — Encounter

## 2018-05-12 ENCOUNTER — Ambulatory Visit (INDEPENDENT_AMBULATORY_CARE_PROVIDER_SITE_OTHER): Payer: BLUE CROSS/BLUE SHIELD | Admitting: Gastroenterology

## 2018-05-12 VITALS — BP 126/72 | HR 74 | Temp 98.5°F | Ht 62.0 in | Wt 143.8 lb

## 2018-05-12 DIAGNOSIS — R079 Chest pain, unspecified: Secondary | ICD-10-CM

## 2018-05-12 DIAGNOSIS — Z1212 Encounter for screening for malignant neoplasm of rectum: Secondary | ICD-10-CM

## 2018-05-12 DIAGNOSIS — Z1211 Encounter for screening for malignant neoplasm of colon: Secondary | ICD-10-CM | POA: Diagnosis not present

## 2018-05-12 DIAGNOSIS — K219 Gastro-esophageal reflux disease without esophagitis: Secondary | ICD-10-CM | POA: Diagnosis not present

## 2018-05-12 DIAGNOSIS — K7689 Other specified diseases of liver: Secondary | ICD-10-CM | POA: Diagnosis not present

## 2018-05-12 NOTE — Patient Instructions (Signed)
We will contact you around 6 weeks to schedule your MRI.   Take ibuoprofen or Aleve as directed. Follow up with your primary care physician if symptoms have not improved in one week.  You will be due for a recall colonoscopy in 09/2018. We will send you a reminder in the mail when it gets closer to that time.  Thank you for choosing me and [facility].  Pricilla Riffle. Dagoberto Ligas., MD., [facility] Regal

## 2018-05-12 NOTE — Progress Notes (Signed)
History of Present Illness: This is a 53 year old female referred by Brenda [name], Brenda Brooke, NP for the evaluation of right-sided chest pain for several weeks.  She states her symptoms worsen with movement and bending her pain occasionally radiates into her right shoulder and right abdomen.  She states she cannot rest on her left side as it is too uncomfortable.  Her symptoms do not change with meals or bowel movements. RUQ [place] as below. Images personally reviewed.  She underwent colonoscopy and EGD by Dr. Sharlett Iles in 2002.  Colonoscopy to terminal ileum was normal.  EGD showed mild reflux changes otherwise normal.  Denies weight loss, constipation, diarrhea, change in stool caliber, melena, hematochezia, nausea, vomiting, dysphagia, reflux symptoms, chest pain.  RUQ [place] 04/23/2018 Multiple cysts and possible hemangioma within the liver. Cysts range from 0.75 cm to 1.71 cm. Short-term follow-up in 6 months is recommended to assess for stability.  No acute abnormality noted.   Allergies  Allergen Reactions  . Codeine Phosphate     REACTION: vomiting   Outpatient Medications Prior to Visit  Medication Sig Dispense Refill  . albuterol (PROVENTIL HFA;VENTOLIN HFA) 108 (90 Base) MCG/ACT inhaler Inhale 2 puffs into the lungs every 6 (six) hours as needed for wheezing or shortness of breath. 1 Inhaler 0  . amLODipine (NORVASC) 5 MG tablet Take 1 tablet (5 mg total) by mouth daily. 30 tablet 0  . blood glucose meter kit and supplies KIT Dispense based on patient and insurance preference.  Use before breakfast and at bedtime. E11.4 1 each 1  . estradiol (VIVELLE-DOT) 0.05 MG/24HR patch APPLY 1 PATCH ONTO SKIN 2 TIMES A WEEK 24 patch 1  . fluticasone (FLONASE) 50 MCG/ACT nasal spray Place 2 sprays into both nostrils daily. 16 g 3  . glipiZIDE (GLUCOTROL XL) 5 MG 24 hr tablet TAKE 1 TABLET BY MOUTH EVERY DAY WITH BREAKFAST 90 tablet 1  . lisinopril (PRINIVIL,ZESTRIL) 2.5 MG tablet Take 1 tablet (2.5 mg  total) by mouth daily. 30 tablet 0  . pantoprazole (PROTONIX) 40 MG tablet Take 1 tablet (40 mg total) by mouth daily. 90 tablet 1  . pravastatin (PRAVACHOL) 40 MG tablet Take 1 tablet (40 mg total) by mouth daily. 90 tablet 0  . promethazine-dextromethorphan (PROMETHAZINE-DM) 6.25-15 MG/5ML syrup TAKE 1 TEASPOON(5ML) BY MOUTH EVERY 6 HOURS AS NEEDED FOR 7 DAYS  0  . sodium chloride (OCEAN) 0.65 % SOLN nasal spray Place 1 spray as needed into both nostrils for congestion. 15 mL 0  . triamcinolone cream (KENALOG) 0.1 % Apply 1 application topically 2 (two) times daily. 30 g 3  . venlafaxine XR (EFFEXOR XR) 75 MG 24 hr capsule Take 1 capsule (75 mg total) by mouth daily with breakfast. 30 capsule 0  . ranitidine (ZANTAC) 150 MG tablet Take 2 tablets (300 mg total) by mouth at bedtime. 90 tablet 1   No facility-administered medications prior to visit.    Past Medical History:  Diagnosis Date  . Allergic rhinitis, cause unspecified   . Anemia, unspecified   . Diabetes mellitus without complication ([place])   . Esophageal reflux   . Generalized anxiety disorder   . Headache(784.0)   . Hypertension   . Pure hypercholesterolemia   . Rash and other nonspecific skin eruption   . Unspecified vitamin D deficiency    Past Surgical History:  Procedure Laterality Date  . TENDON REPAIR  1990   left index finger   Social History   Socioeconomic History  .  Marital status: Single    Spouse name: Not on file  . Number of children: Not on file  . Years of education: Not on file  . Highest education level: Not on file  Occupational History  . Occupation: Presenter, broadcasting  Social Needs  . Financial resource strain: Not on file  . Food insecurity:    Worry: Not on file    Inability: Not on file  . Transportation needs:    Medical: Not on file    Non-medical: Not on file  Tobacco Use  . Smoking status: Never Smoker  . Smokeless tobacco: Never Used  Substance and Sexual Activity  . Alcohol use:  Yes    Comment: occasionally  . Drug use: No  . Sexual activity: Not Currently  Lifestyle  . Physical activity:    Days per week: Not on file    Minutes per session: Not on file  . Stress: Not on file  Relationships  . Social connections:    Talks on phone: Not on file    Gets together: Not on file    Attends religious service: Not on file    Active member of club or organization: Not on file    Attends meetings of clubs or organizations: Not on file    Relationship status: Not on file  Other Topics Concern  . Not on file  Social History Narrative  . Not on file   Family History  Problem Relation Age of Onset  . Heart disease Maternal Grandfather   . Asthma Mother   . Diabetes Mother   . Arthritis Mother   . Alcohol abuse Mother   . Asthma Sister   . Diabetes Sister   . Allergies Sister        Review of Systems: Pertinent positive and negative review of systems were noted in the above HPI section. All other review of systems were otherwise negative.    Physical Exam: General: Well developed, well nourished, no acute distress Head: Normocephalic and atraumatic Eyes:  sclerae anicteric, EOMI Ears: Normal auditory acuity Mouth: No deformity or lesions Neck: Supple, no masses or thyromegaly Lungs: Clear throughout to auscultation Heart: Regular rate and rhythm; no murmurs, rubs or bruits Chest: tenderness over right anterior and lateral chest wall, reproduced her symptoms Abdomen: Soft, non tender and non distended. No masses, hepatosplenomegaly or hernias noted. Normal Bowel sounds Rectal: not done  Musculoskeletal: Symmetrical with no gross deformities  Skin: No lesions on visible extremities Pulses:  Normal pulses noted Extremities: No clubbing, cyanosis, edema or deformities noted Neurological: Alert oriented x 4, grossly nonfocal Cervical Nodes:  No significant cervical adenopathy Inguinal Nodes: No significant inguinal adenopathy Psychological:  Alert and  cooperative. Normal mood and affect   Assessment and Recommendations:  1. Right sided chest wall pain, musculoskeletal.  Pain radiates to her right abdomen and right shoulder. Begin OTC Aleve or Advil prn for 1 week per label directions.  If symptoms do not resolve over the next week contact PCP for further management.  2.  Multiple small hepatic cysts and possible small hemangioma.  These are asymptomatic. They do not relate to her current symptoms.  Further evaluation with hepatic MRI once COVID-19 restrictions are relaxed. Plan for MRI in 6-8 weeks.   3. GERD.  Well-controlled on antireflux measures and pantoprazole 40 mg daily.  Continue current management.  4.  CRC screening, average risk.  Colonoscopy in August 2020 after COVID-19 restrictions are relaxed.   cc: Flossie Buffy, NP  [place], [place] [address]

## 2018-06-01 ENCOUNTER — Encounter: Payer: Self-pay | Admitting: Nurse Practitioner

## 2018-06-01 ENCOUNTER — Ambulatory Visit: Payer: Self-pay | Admitting: Gastroenterology

## 2018-06-01 ENCOUNTER — Ambulatory Visit (INDEPENDENT_AMBULATORY_CARE_PROVIDER_SITE_OTHER): Payer: BLUE CROSS/BLUE SHIELD | Admitting: Nurse Practitioner

## 2018-06-01 VITALS — Ht 62.0 in | Wt 145.0 lb

## 2018-06-01 DIAGNOSIS — F329 Major depressive disorder, single episode, unspecified: Secondary | ICD-10-CM

## 2018-06-01 DIAGNOSIS — I10 Essential (primary) hypertension: Secondary | ICD-10-CM

## 2018-06-01 DIAGNOSIS — E1165 Type 2 diabetes mellitus with hyperglycemia: Secondary | ICD-10-CM

## 2018-06-01 DIAGNOSIS — F411 Generalized anxiety disorder: Secondary | ICD-10-CM | POA: Diagnosis not present

## 2018-06-01 DIAGNOSIS — F32A Depression, unspecified: Secondary | ICD-10-CM

## 2018-06-01 DIAGNOSIS — E7800 Pure hypercholesterolemia, unspecified: Secondary | ICD-10-CM

## 2018-06-01 MED ORDER — VENLAFAXINE HCL ER 75 MG PO CP24
ORAL_CAPSULE | ORAL | 2 refills | Status: DC
Start: 2018-06-01 — End: 2018-06-26

## 2018-06-01 MED ORDER — VENLAFAXINE HCL ER 150 MG PO CP24: 150.0000 mg | ORAL_CAPSULE | Freq: Every day | ORAL | 1 refills | Status: DC

## 2018-06-01 MED ORDER — AMLODIPINE BESYLATE 5 MG PO TABS: 5.0000 mg | ORAL_TABLET | Freq: Every day | ORAL | 1 refills | Status: DC

## 2018-06-01 NOTE — Progress Notes (Signed)
Virtual Visit via Video Note  I connected with Brenda [name] on 06/01/18 at  1:00 PM EDT by a video enabled telemedicine application and verified that I am speaking with the correct person using two identifiers.   I discussed the limitations of evaluation and management by telemedicine and the availability of in person appointments. The patient expressed understanding and agreed to proceed.  History of Present Illness:  HTN: unsontrolled Home BP reading in last 1week140s/90s-150s/90s. Not taking med amlodipine nor lisinopril  DM: Uncontrolled Fasting glucose:250s Not taking glipizide  Anxiety and Depression: Uncontrolled. Did not take effexor as prescribed, did not make appt with psychiatry Depression screen [facility] 2/9 06/01/2018 07/15/2016 10/21/2013  Decreased Interest 3 2 0  Down, Depressed, Hopeless 2 1 1   PHQ - 2 Score 5 3 1   Altered sleeping 3 1 -  Tired, decreased energy 1 2 -  Change in appetite 2 1 -  Feeling bad or failure about yourself  2 2 -  Trouble concentrating 1 1 -  Moving slowly or fidgety/restless 0 2 -  Suicidal thoughts 0 1 -  PHQ-9 Score 14 13 -   GAD 7 : Generalized Anxiety Score 06/01/2018 07/15/2016  Nervous, Anxious, on Edge 3 3  Control/stop worrying 3 3  Worry too much - different things 3 3  Trouble relaxing 2 3  Restless 1 2  Easily annoyed or irritable 2 2  Afraid - awful might happen 1 2  Total GAD 7 Score 15 18   Observations/Objective: Unable to check any vital signs today  Assessment and Plan: Brenda [name] was seen today for follow-up.  Diagnoses and all orders for this visit:  Essential hypertension, benign -     Basic metabolic panel; Future -     amLODipine (NORVASC) 5 MG tablet; Take 1 tablet (5 mg total) by mouth daily.  ANXIETY DISORDER, GENERALIZED -     Discontinue: venlafaxine XR (EFFEXOR-XR) 150 MG 24 hr capsule; Take 1 capsule (150 mg total) by mouth daily with breakfast. -     venlafaxine XR (EFFEXOR-XR) 75 MG 24 hr capsule;  Take 1 capsule (75 mg total) by mouth daily with breakfast for 7 days, THEN 2 capsules (150 mg total) daily with breakfast for 21 days.  Type 2 diabetes mellitus with hyperglycemia, without long-term current use of insulin (HCC) -     Basic metabolic panel; Future -     Hemoglobin A1c; Future -     Microalbumin / creatinine urine ratio; Future  Depression, unspecified depression type -     Discontinue: venlafaxine XR (EFFEXOR-XR) 150 MG 24 hr capsule; Take 1 capsule (150 mg total) by mouth daily with breakfast. -     venlafaxine XR (EFFEXOR-XR) 75 MG 24 hr capsule; Take 1 capsule (75 mg total) by mouth daily with breakfast for 7 days, THEN 2 capsules (150 mg total) daily with breakfast for 21 days.  HYPERCHOLESTEROLEMIA -     Lipid panel; Future   Follow Up Instructions: She agreed to Return to lab within this week for blood draw and urine collection. Will need to review lab results prior to sending additional medications. Resume amlodipine and effexor.   I discussed the assessment and treatment plan with the patient. The patient was provided an opportunity to ask questions and all were answered. The patient agreed with the plan and demonstrated an understanding of the instructions.   The patient was advised to call back or seek an in-person evaluation if the symptoms worsen or if  the condition fails to improve as anticipated.  I provided 25 minutes of non-face-to-face time during this encounter.   Wilfred Lacy, NP

## 2018-06-03 ENCOUNTER — Encounter: Payer: Self-pay | Admitting: Nurse Practitioner

## 2018-06-03 NOTE — Patient Instructions (Signed)
She agreed to Return to lab within this week for blood draw and urine collection. Will need to review lab results prior to sending additional medications. Resume amlodipine and effexor.

## 2018-06-22 ENCOUNTER — Ambulatory Visit: Payer: Self-pay | Admitting: Nurse Practitioner

## 2018-06-22 ENCOUNTER — Other Ambulatory Visit: Payer: BLUE CROSS/BLUE SHIELD

## 2018-06-22 ENCOUNTER — Other Ambulatory Visit: Payer: Self-pay

## 2018-06-22 DIAGNOSIS — D1803 Hemangioma of intra-abdominal structures: Secondary | ICD-10-CM

## 2018-06-22 DIAGNOSIS — K7689 Other specified diseases of liver: Secondary | ICD-10-CM

## 2018-06-24 ENCOUNTER — Ambulatory Visit: Payer: BLUE CROSS/BLUE SHIELD | Admitting: Nurse Practitioner

## 2018-06-26 ENCOUNTER — Other Ambulatory Visit: Payer: Self-pay | Admitting: Nurse Practitioner

## 2018-06-26 DIAGNOSIS — F32A Depression, unspecified: Secondary | ICD-10-CM

## 2018-06-26 DIAGNOSIS — F411 Generalized anxiety disorder: Secondary | ICD-10-CM

## 2018-06-26 DIAGNOSIS — F329 Major depressive disorder, single episode, unspecified: Secondary | ICD-10-CM

## 2018-06-29 ENCOUNTER — Other Ambulatory Visit: Payer: BLUE CROSS/BLUE SHIELD

## 2018-07-01 ENCOUNTER — Ambulatory Visit: Payer: BLUE CROSS/BLUE SHIELD | Admitting: Nurse Practitioner

## 2018-07-04 ENCOUNTER — Other Ambulatory Visit: Payer: BLUE CROSS/BLUE SHIELD

## 2018-07-23 ENCOUNTER — Telehealth: Payer: Self-pay

## 2018-07-23 NOTE — Telephone Encounter (Signed)
During this illness, did/does the patient experience any of the following symptoms? Fever >100.8F []   Yes [x]   No []   Unknown Subjective fever (felt feverish) []   Yes [x]   No []   Unknown Chills []   Yes []   No []   Unknown Muscle aches (myalgia) []   Yes []   No [x]   Unknown Runny nose (rhinorrhea) []   Yes [x]   No []   Unknown Sore throat []   Yes []   No []   Unknown Cough (new onset or worsening of chronic cough) []   Yes []   No []   Unknown Shortness of breath (dyspnea) []   Yes []   No []   Unknown Nausea or vomiting []   Yes [x]   No []   Unknown Headache []   Yes [x]   No []   Unknown Abdominal pain  []   Yes [x]   No []   Unknown Diarrhea (?3 loose/looser than normal stools/24hr period) []   Yes []   No []   Unknown Other, specify:

## 2018-07-24 ENCOUNTER — Other Ambulatory Visit (INDEPENDENT_AMBULATORY_CARE_PROVIDER_SITE_OTHER): Payer: BC Managed Care – PPO

## 2018-07-24 DIAGNOSIS — E7800 Pure hypercholesterolemia, unspecified: Secondary | ICD-10-CM | POA: Diagnosis not present

## 2018-07-24 DIAGNOSIS — E1165 Type 2 diabetes mellitus with hyperglycemia: Secondary | ICD-10-CM

## 2018-07-24 DIAGNOSIS — I10 Essential (primary) hypertension: Secondary | ICD-10-CM

## 2018-07-24 DIAGNOSIS — E118 Type 2 diabetes mellitus with unspecified complications: Secondary | ICD-10-CM

## 2018-07-24 LAB — MICROALBUMIN / CREATININE URINE RATIO
Creatinine,U: 158.5 mg/dL
Microalb Creat Ratio: 3 mg/g (ref 0.0–30.0)
Microalb, Ur: 4.7 mg/dL — ABNORMAL HIGH (ref 0.0–1.9)

## 2018-07-24 LAB — LIPID PANEL
Cholesterol: 214 mg/dL — ABNORMAL HIGH (ref 0–200)
HDL: 54.3 mg/dL (ref >39.00)
LDL Cholesterol: 147 mg/dL — ABNORMAL HIGH (ref 0–99)
NonHDL: 159.45
Total CHOL/HDL Ratio: 4
Triglycerides: 64 mg/dL (ref 0.0–149.0)
VLDL: 12.8 mg/dL (ref 0.0–40.0)

## 2018-07-24 LAB — BASIC METABOLIC PANEL
BUN: 10 mg/dL (ref 6–23)
CO2: 26 mEq/L (ref 19–32)
Calcium: 9.3 mg/dL (ref 8.4–10.5)
Chloride: 103 mEq/L (ref 96–112)
Creatinine, Ser: 0.62 mg/dL (ref 0.40–1.20)
GFR: 120.93 mL/min (ref >60.00)
Glucose, Bld: 189 mg/dL — ABNORMAL HIGH (ref 70–99)
Potassium: 4.1 mEq/L (ref 3.5–5.1)
Sodium: 138 mEq/L (ref 135–145)

## 2018-07-24 LAB — HEMOGLOBIN A1C: Hgb A1c MFr Bld: 7.5 % — ABNORMAL HIGH (ref 4.6–6.5)

## 2018-07-27 ENCOUNTER — Telehealth: Payer: Self-pay | Admitting: Nurse Practitioner

## 2018-07-27 MED ORDER — GLIPIZIDE ER 5 MG PO TB24: ORAL_TABLET | ORAL | 1 refills | Status: DC

## 2018-07-27 MED ORDER — ATORVASTATIN CALCIUM 20 MG PO TABS: 20.0000 mg | ORAL_TABLET | Freq: Every day | ORAL | 1 refills | Status: DC

## 2018-07-27 MED ORDER — LISINOPRIL 5 MG PO TABS: 5.0000 mg | ORAL_TABLET | Freq: Every day | ORAL | 1 refills | Status: DC

## 2018-07-27 NOTE — Addendum Note (Signed)
Addended by: Wilfred Lacy L on: 07/27/2018 11:10 AM   Modules accepted: Orders

## 2018-07-27 NOTE — Telephone Encounter (Signed)
-----   Message from Shawnie Pons, LPN sent at 08/04/7626 11:57 AM EDT ----- Pt verbalize understand of test result.   Pt was wondering if Brenda [name] will be able to write her out of work some due to an out break of COVID 19 at work and it is spreading to some of the employee already--she is out on short term disability right now but not sure if Brenda [name] can help some more because she doesn't not want to get infected due to her health issue. Pt denied any symptoms for right now.   Pt has an appt 07/28/2018 for depression and anxiety--do you want her to keep this? And make another appt in 3 month for F/u? Please advise.

## 2018-07-27 NOTE — Telephone Encounter (Signed)
Unable to leave vm, need to inform the pt letter is print and talk with her about tomorrow appt.

## 2018-07-27 NOTE — Telephone Encounter (Signed)
Please inform patient about letter

## 2018-07-28 ENCOUNTER — Encounter: Payer: Self-pay | Admitting: Nurse Practitioner

## 2018-07-28 ENCOUNTER — Telehealth: Payer: BC Managed Care – PPO | Admitting: Nurse Practitioner

## 2018-07-28 NOTE — Telephone Encounter (Signed)
Spoke with the pt, she is aware. Letter faxed.

## 2018-07-28 NOTE — Telephone Encounter (Signed)
LVM for the pt to call back.

## 2018-07-30 ENCOUNTER — Telehealth: Payer: Self-pay | Admitting: Nurse Practitioner

## 2018-07-30 NOTE — Telephone Encounter (Signed)
Pt called this morning about short tram disability forms that needs to be filled out. Paperwork did come through the fax. Patient will need to come by the office and fill out her part on the forms. After forms are completed, please fax forms to two fax numbers: [phone] and [phone]. Forms will be in providers folder at the front.

## 2018-07-31 DIAGNOSIS — Z0279 Encounter for issue of other medical certificate: Secondary | ICD-10-CM

## 2018-07-31 NOTE — Telephone Encounter (Signed)
Patient requesting form be re faxed to [phone] as they never received initial fax.

## 2018-07-31 NOTE — Telephone Encounter (Signed)
From refaxed to below fax # as pt request.

## 2018-07-31 NOTE — Telephone Encounter (Signed)
LVM for the pt to call back, need to inform that the form faxed, need to offer an appt with PCP in 2 mo F2F.

## 2018-07-31 NOTE — Telephone Encounter (Signed)
Form completed to return to work 09/26/2018. She is to schedule f71f appt with me in 50months

## 2018-07-31 NOTE — Telephone Encounter (Signed)
Pt is aware appt made

## 2018-07-31 NOTE — Telephone Encounter (Signed)
Pt came in to fill out her part of the form. Pt stated it is due by Monday 08/03/2018.  BP 148/90 Pulse 82  Temp 98  Weight 146.4 LB

## 2018-08-05 ENCOUNTER — Telehealth: Payer: Self-pay | Admitting: Nurse Practitioner

## 2018-08-05 DIAGNOSIS — F329 Major depressive disorder, single episode, unspecified: Secondary | ICD-10-CM

## 2018-08-05 DIAGNOSIS — F32A Depression, unspecified: Secondary | ICD-10-CM

## 2018-08-05 DIAGNOSIS — F411 Generalized anxiety disorder: Secondary | ICD-10-CM

## 2018-08-05 NOTE — Telephone Encounter (Signed)
Patient returning call.

## 2018-08-05 NOTE — Telephone Encounter (Signed)
Spoke with the pt, she stated she need referral to [facility] for her depression.   Pt also stated she wants Baldo Ash to refer her to Dr. Susa Raring due to memory loss (forgetting thing more often)--hasnt not see Baldo Ash about this yet.   [place] please advise, pt has an appt with you on 09/29/2018.

## 2018-08-05 NOTE — Telephone Encounter (Signed)
Ok to enter referral to psychiatry. We can discuss memory loss during upcoming appt or she can schedule virtual visit sooner

## 2018-08-05 NOTE — Telephone Encounter (Signed)
LVM for the pt to call back.

## 2018-08-05 NOTE — Telephone Encounter (Signed)
Referral Request - Has patient seen PCP for this complaint? No.   Referral for which specialty: Neurologist Preferred provider/office:Dr. Susa Raring, [facility]  Reason for referral: Patient request  CB# for patient [phone]

## 2018-08-06 NOTE — Telephone Encounter (Signed)
Referral enter, pt is aware. appt set for referral consult on 08/10/2018

## 2018-08-08 ENCOUNTER — Other Ambulatory Visit: Payer: BC Managed Care – PPO

## 2018-08-10 ENCOUNTER — Telehealth: Payer: BC Managed Care – PPO | Admitting: Nurse Practitioner

## 2018-08-11 ENCOUNTER — Other Ambulatory Visit: Payer: Self-pay | Admitting: Nurse Practitioner

## 2018-08-11 DIAGNOSIS — F411 Generalized anxiety disorder: Secondary | ICD-10-CM

## 2018-08-11 DIAGNOSIS — F329 Major depressive disorder, single episode, unspecified: Secondary | ICD-10-CM

## 2018-08-11 DIAGNOSIS — F32A Depression, unspecified: Secondary | ICD-10-CM

## 2018-08-12 NOTE — Telephone Encounter (Signed)
Please advise, this rx discontinue back in 04/2017.

## 2018-09-08 ENCOUNTER — Other Ambulatory Visit: Payer: Self-pay | Admitting: Nurse Practitioner

## 2018-09-08 DIAGNOSIS — E1165 Type 2 diabetes mellitus with hyperglycemia: Secondary | ICD-10-CM

## 2018-09-08 NOTE — Telephone Encounter (Addendum)
Pt called in to request a Rx for her Verio One Touch test strips. Pt says that this would be a new Rx.  Pt would like to have Rx sent to the pharmacy below.   [facility] [place]: [phone]

## 2018-09-08 NOTE — Telephone Encounter (Signed)
Baldo Ash pleas advise, Ok to send in new rx?

## 2018-09-08 NOTE — Telephone Encounter (Signed)
ok 

## 2018-09-09 MED ORDER — GLUCOSE BLOOD VI STRP
ORAL_STRIP | 3 refills | Status: DC
Start: 2018-09-09 — End: 2019-12-18

## 2018-09-09 NOTE — Telephone Encounter (Signed)
Left detailed VM letting pt know we sent in test strips to pharmacy provided.

## 2018-09-09 NOTE — Telephone Encounter (Signed)
rx refilled and sent to pharmacy requested.

## 2018-09-15 ENCOUNTER — Telehealth: Payer: Self-pay

## 2018-09-15 NOTE — Telephone Encounter (Signed)
Brenda [name] please advise okay to extend letter for pt?  Copied from [place] [phone]. Topic: General - Other >> Sep 15, 2018  9:51 AM Keene Breath wrote: Reason for CRM: Patient called to request that the doctor extend her COVID test letter that she gave the patient earlier.  Patient stated that her insurance company is requesting another letter with an extension for her to go back to work.  Please advise and notify the patient when it is available.  CB# [phone]

## 2018-09-15 NOTE — Telephone Encounter (Signed)
Previous letter written does not have an end date, so why is another letter needed? You can print  It again and re fax.

## 2018-09-16 NOTE — Telephone Encounter (Signed)
Spoke with pt. She is dropping off short term disability form at the front desk for [place] to fill out. The note that was provided is fine.

## 2018-09-21 NOTE — Telephone Encounter (Signed)
Spoke with the patient, she stated they still need more more infomation from her PCP because disability expired after 5 weeks since the letter sent in from our office (even the letter doesn't have an end date). They need to to know why the patient unable to go back to work, pt stated we can mention that we still in phase 2 of pandemic and her work place still has an outbreak within the department.   Waiting for pt to fax over paper work---gave her fax # [phone] (since the [phone] is not working).

## 2018-09-23 NOTE — Telephone Encounter (Signed)
Letter written

## 2018-09-23 NOTE — Telephone Encounter (Signed)
Spoke with the patient, she stated her work place now requesting a letter from PCP stating specific date in the letter for ex: include in the letter that pt needs to be out from 07/27/2018-until safe to resume duties (last letter is good but need specific date in there). Baldo Ash please advise  Please fax to Andres Labrum # [phone]  Pt will also drop off short term disability to be fill out as well.

## 2018-09-23 NOTE — Telephone Encounter (Signed)
Patient called in stating she is needing a call back from [place] as she is having an issue with faxing paperwork and is also needing to discuss a few things. Call back is [phone].

## 2018-09-23 NOTE — Telephone Encounter (Signed)
Letter faxed.

## 2018-09-24 ENCOUNTER — Telehealth: Payer: Self-pay | Admitting: Nurse Practitioner

## 2018-09-24 NOTE — Telephone Encounter (Signed)
Patient came by a dropped off disability forms for [facility] to fill out. Paperwork will be in provider folder at the front. Please mail paperwork when completed.

## 2018-09-28 ENCOUNTER — Ambulatory Visit (INDEPENDENT_AMBULATORY_CARE_PROVIDER_SITE_OTHER): Payer: BC Managed Care – PPO | Admitting: Nurse Practitioner

## 2018-09-28 ENCOUNTER — Encounter: Payer: Self-pay | Admitting: Nurse Practitioner

## 2018-09-28 ENCOUNTER — Other Ambulatory Visit: Payer: Self-pay

## 2018-09-28 VITALS — Ht 62.0 in

## 2018-09-28 DIAGNOSIS — E7800 Pure hypercholesterolemia, unspecified: Secondary | ICD-10-CM

## 2018-09-28 DIAGNOSIS — K219 Gastro-esophageal reflux disease without esophagitis: Secondary | ICD-10-CM

## 2018-09-28 DIAGNOSIS — F411 Generalized anxiety disorder: Secondary | ICD-10-CM | POA: Diagnosis not present

## 2018-09-28 DIAGNOSIS — F331 Major depressive disorder, recurrent, moderate: Secondary | ICD-10-CM | POA: Diagnosis not present

## 2018-09-28 DIAGNOSIS — N951 Menopausal and female climacteric states: Secondary | ICD-10-CM

## 2018-09-28 DIAGNOSIS — E1165 Type 2 diabetes mellitus with hyperglycemia: Secondary | ICD-10-CM | POA: Diagnosis not present

## 2018-09-28 MED ORDER — PANTOPRAZOLE SODIUM 40 MG PO TBEC: 40.0000 mg | DELAYED_RELEASE_TABLET | Freq: Every day | ORAL | 1 refills | Status: DC

## 2018-09-28 MED ORDER — ATORVASTATIN CALCIUM 20 MG PO TABS: 20.0000 mg | ORAL_TABLET | Freq: Every day | ORAL | 1 refills | Status: DC

## 2018-09-28 MED ORDER — MIRTAZAPINE 15 MG PO TABS: 15.0000 mg | ORAL_TABLET | Freq: Every day | ORAL | 2 refills | Status: DC

## 2018-09-28 MED ORDER — ESTRADIOL 0.025 MG/24HR TD PTWK: 0.0250 mg | MEDICATED_PATCH | TRANSDERMAL | 11 refills | Status: DC

## 2018-09-28 MED ORDER — VENLAFAXINE HCL ER 75 MG PO CP24: 75.0000 mg | ORAL_CAPSULE | Freq: Every day | ORAL | 1 refills | Status: DC

## 2018-09-28 NOTE — Progress Notes (Signed)
Virtual Visit via Video Note  I connected with Brenda [name] on 09/28/18 at 10:15 AM EDT by a video enabled telemedicine application and verified that I am speaking with the correct person using two identifiers.  Location: Patient: Home Provider: Office   I discussed the limitations of evaluation and management by telemedicine and the availability of in person appointments. The patient expressed understanding and agreed to proceed.  History of Present Illness: Anxiety and Depression: Minimal improvement with effexor. Unable to tolerate trazodone. No improvement with zoloft.  Increased anxiety due to current pandemic: her friend was possibly exposed to another individual with [place]. Her fired is asymptomatic and waiting for COVID test results. She denies any COVID related symptoms at this time. Depression screen [facility] 2/9 09/28/2018 06/01/2018 07/15/2016  Decreased Interest 1 3 2   Down, Depressed, Hopeless 2 2 1   PHQ - 2 Score 3 5 3   Altered sleeping 3 3 1   Tired, decreased energy 2 1 2   Change in appetite 3 2 1   Feeling bad or failure about yourself  0 2 2  Trouble concentrating 1 1 1   Moving slowly or fidgety/restless 0 0 2  Suicidal thoughts 0 0 1  PHQ-9 Score 12 14 13    GAD 7 : Generalized Anxiety Score 09/28/2018 06/01/2018 07/15/2016  Nervous, Anxious, on Edge 2 3 3   Control/stop worrying 1 3 3   Worry too much - different things 1 3 3   Trouble relaxing 1 2 3   Restless 1 1 2   Easily annoyed or irritable 1 2 2   Afraid - awful might happen 1 1 2   Total GAD 7 Score 8 15 18    She needs medication refill: estradiol and  lipitor  Observations/Objective: Physical Exam  Constitutional: She is oriented to person, place, and time. She appears well-developed. No distress.  Eyes: Pupils are equal, round, and reactive to light. EOM are normal.  Neck: Normal range of motion. Neck supple.  Pulmonary/Chest: Effort normal.  Neurological: She is alert and oriented to person, place, and time.   Psychiatric: Her speech is normal and behavior is normal. Thought content normal. Her mood appears anxious. Cognition and memory are normal.  Vitals reviewed.  Assessment and Plan: Brenda [name] was seen today for follow-up.  Diagnoses and all orders for this visit:  ANXIETY DISORDER, GENERALIZED -     mirtazapine (REMERON) 15 MG tablet; Take 1 tablet (15 mg total) by mouth at bedtime. -     venlafaxine XR (EFFEXOR-XR) 75 MG 24 hr capsule; Take 1 capsule (75 mg total) by mouth daily with breakfast. -     Ambulatory referral to Psychiatry  Moderate episode of recurrent major depressive disorder (HCC) -     mirtazapine (REMERON) 15 MG tablet; Take 1 tablet (15 mg total) by mouth at bedtime. -     venlafaxine XR (EFFEXOR-XR) 75 MG 24 hr capsule; Take 1 capsule (75 mg total) by mouth daily with breakfast. -     Ambulatory referral to Psychiatry  Type 2 diabetes mellitus with hyperglycemia, without long-term current use of insulin (HCC) -     atorvastatin (LIPITOR) 20 MG tablet; Take 1 tablet (20 mg total) by mouth daily at 6 PM.  HYPERCHOLESTEROLEMIA -     atorvastatin (LIPITOR) 20 MG tablet; Take 1 tablet (20 mg total) by mouth daily at 6 PM.  Gastroesophageal reflux disease without esophagitis -     pantoprazole (PROTONIX) 40 MG tablet; Take 1 tablet (40 mg total) by mouth daily.  Hot flashes due  to menopause -     estradiol (CLIMARA - DOSED IN MG/24 HR) 0.025 mg/24hr patch; Place 1 patch (0.025 mg total) onto the skin once a week.   Follow Up Instructions: See avs   I discussed the assessment and treatment plan with the patient. The patient was provided an opportunity to ask questions and all were answered. The patient agreed with the plan and demonstrated an understanding of the instructions.   The patient was advised to call back or seek an in-person evaluation if the symptoms worsen or if the condition fails to improve as anticipated.   Wilfred Lacy, NP

## 2018-09-28 NOTE — Patient Instructions (Addendum)
Call office to schedule f/up appt in 73month. Start remeron at bedtime. Continue effexor.  Let us know you friend's COVID test results and if you develop any symptoms.  COVID-19: How to Protect Yourself and Others Know how it spreads  There is currently no vaccine to prevent coronavirus disease 2019 (COVID-19).  The best way to prevent illness is to avoid being exposed to this virus.  The virus is thought to spread mainly from person-to-person. ? Between people who are in close contact with one another (within about 6 feet). ? Through respiratory droplets produced when an infected person coughs, sneezes or talks. ? These droplets can land in the mouths or noses of people who are nearby or possibly be inhaled into the lungs. ? Some recent studies have suggested that COVID-19 may be spread by people who are not showing symptoms. Everyone should Clean your hands often  Wash your hands often with soap and water for at least 20 seconds especially after you have been in a public place, or after blowing your nose, coughing, or sneezing.  If soap and water are not readily available, use a hand sanitizer that contains at least 60% alcohol. Cover all surfaces of your hands and rub them together until they feel dry.  Avoid touching your eyes, nose, and mouth with unwashed hands. Avoid close contact  Stay home if you are sick.  Avoid close contact with people who are sick.  Put distance between yourself and other people. ? Remember that some people without symptoms may be able to spread virus. ? This is especially important for people who are at higher risk of getting very GainPain.com.cy Cover your mouth and nose with a cloth face cover when around others  You could spread COVID-19 to others even if you do not feel sick.  Everyone should wear a cloth face cover when they have to go out in public, for example to the  grocery store or to pick up other necessities. ? Cloth face coverings should not be placed on young children under age 72, anyone who has trouble breathing, or is unconscious, incapacitated or otherwise unable to remove the mask without assistance.  The cloth face cover is meant to protect other people in case you are infected.  Do NOT use a facemask meant for a Dietitian.  Continue to keep about 6 feet between yourself and others. The cloth face cover is not a substitute for social distancing. Cover coughs and sneezes  If you are in a private setting and do not have on your cloth face covering, remember to always cover your mouth and nose with a tissue when you cough or sneeze or use the inside of your elbow.  Throw used tissues in the trash.  Immediately wash your hands with soap and water for at least 20 seconds. If soap and water are not readily available, clean your hands with a hand sanitizer that contains at least 60% alcohol. Clean and disinfect  Clean AND disinfect frequently touched surfaces daily. This includes tables, doorknobs, light switches, countertops, handles, desks, phones, keyboards, toilets, faucets, and sinks. RackRewards.fr  If surfaces are dirty, clean them: Use detergent or soap and water prior to disinfection.  Then, use a household disinfectant. You can see a list of EPA-registered household disinfectants here. michellinders.com 06/16/2018 This information is not intended to replace advice given to you by your health care provider. Make sure you discuss any questions you have with your health care provider. Document Released: 05/26/2018 Document  Revised: 06/24/2018 Document Reviewed: 05/26/2018 Elsevier Patient Education  [employer].

## 2018-09-29 ENCOUNTER — Ambulatory Visit: Payer: BC Managed Care – PPO | Admitting: Nurse Practitioner

## 2018-09-29 DIAGNOSIS — Z0279 Encounter for issue of other medical certificate: Secondary | ICD-10-CM

## 2018-09-29 NOTE — Telephone Encounter (Signed)
Paper work faxed to 2 places as pt requested and mail to pt's home address.

## 2018-10-12 ENCOUNTER — Other Ambulatory Visit: Payer: Self-pay | Admitting: Nurse Practitioner

## 2018-10-12 DIAGNOSIS — N951 Menopausal and female climacteric states: Secondary | ICD-10-CM

## 2018-10-12 DIAGNOSIS — F331 Major depressive disorder, recurrent, moderate: Secondary | ICD-10-CM

## 2018-10-12 DIAGNOSIS — F411 Generalized anxiety disorder: Secondary | ICD-10-CM

## 2018-10-26 ENCOUNTER — Telehealth: Payer: Self-pay | Admitting: Nurse Practitioner

## 2018-10-26 NOTE — Telephone Encounter (Signed)
Spoke with the pt and advise her that Baldo Ash want some kind of letter from her employee that this is whats going on. Pt is aware and she going to speak to the HR department about this. Waiting for the pt to call back/fax of letter from he work place.     Copied from [place] [id number]. Topic: General - Other >> Oct 26, 2018 11:15 AM Carolyn Stare wrote: Pt said she is suppose to return to work on 11/06/2018 and req that her absent be extended , they had another out break of COVID on her job  [phone]

## 2018-10-27 NOTE — Telephone Encounter (Signed)
Pt returning call to nurse

## 2018-10-27 NOTE — Telephone Encounter (Signed)
Please call HR lady name Andres Labrum for more information about the outbreak  [phone]

## 2018-10-27 NOTE — Telephone Encounter (Signed)
Just called and left voice message

## 2018-10-28 NOTE — Telephone Encounter (Signed)
Pt is aware.  

## 2018-10-28 NOTE — Telephone Encounter (Signed)
LVM for the pt to call back, need to inform message below.  

## 2018-10-28 NOTE — Telephone Encounter (Signed)
I just talked to [employer] who stated there has not been any COVID cases for several weeks. She is to return to work with recommended CDC guidelines (mask, social distancing and handwashing).

## 2018-10-29 ENCOUNTER — Ambulatory Visit: Payer: BC Managed Care – PPO

## 2018-11-03 ENCOUNTER — Telehealth: Payer: Self-pay | Admitting: Nurse Practitioner

## 2018-11-03 NOTE — Telephone Encounter (Signed)
I can not writing her out of work continue because of her fear of possible exposure. I recommend she returns to work and follow CDC guidelines.

## 2018-11-03 NOTE — Telephone Encounter (Signed)
Informed patient of message below. Patient verbalized understanding.

## 2018-11-03 NOTE — Telephone Encounter (Signed)
Please advise   Copied from [place] [id number]. Topic: General - Other >> Nov 03, 2018 12:23 PM Leward Quan A wrote: Reason for CRM: Patient called to request a call back from [place] states that there is another coronavirus outbreak at her job and she is fearing going back so asking to be called by [facility] please. Ph# [phone]

## 2018-11-03 NOTE — Telephone Encounter (Signed)
LMV for the pt to call back, need to inform message below.

## 2018-11-17 ENCOUNTER — Other Ambulatory Visit: Payer: Self-pay | Admitting: Nurse Practitioner

## 2018-11-17 DIAGNOSIS — N951 Menopausal and female climacteric states: Secondary | ICD-10-CM

## 2018-11-19 ENCOUNTER — Other Ambulatory Visit: Payer: Self-pay | Admitting: Nurse Practitioner

## 2018-11-19 DIAGNOSIS — F331 Major depressive disorder, recurrent, moderate: Secondary | ICD-10-CM

## 2018-11-19 DIAGNOSIS — F411 Generalized anxiety disorder: Secondary | ICD-10-CM

## 2018-11-19 NOTE — Telephone Encounter (Signed)
LVM for the pt to call back, need to offer 1 mo f/u with Nche (last ov was 09/2018).

## 2018-11-21 ENCOUNTER — Other Ambulatory Visit: Payer: Self-pay | Admitting: Nurse Practitioner

## 2018-12-03 ENCOUNTER — Other Ambulatory Visit: Payer: Self-pay | Admitting: Nurse Practitioner

## 2018-12-04 ENCOUNTER — Other Ambulatory Visit: Payer: Self-pay

## 2018-12-04 ENCOUNTER — Ambulatory Visit (INDEPENDENT_AMBULATORY_CARE_PROVIDER_SITE_OTHER): Payer: BC Managed Care – PPO | Admitting: Family Medicine

## 2018-12-04 DIAGNOSIS — Z5329 Procedure and treatment not carried out because of patient's decision for other reasons: Secondary | ICD-10-CM

## 2018-12-04 NOTE — Progress Notes (Signed)
Pt did not answer phone call x 3 or respond to [email or web address] link that was sent via text x 3

## 2018-12-07 ENCOUNTER — Encounter: Payer: Self-pay | Admitting: Nurse Practitioner

## 2018-12-07 ENCOUNTER — Other Ambulatory Visit: Payer: Self-pay

## 2018-12-07 ENCOUNTER — Ambulatory Visit (INDEPENDENT_AMBULATORY_CARE_PROVIDER_SITE_OTHER): Payer: BC Managed Care – PPO | Admitting: Nurse Practitioner

## 2018-12-07 VITALS — Ht 62.0 in

## 2018-12-07 DIAGNOSIS — K529 Noninfective gastroenteritis and colitis, unspecified: Secondary | ICD-10-CM

## 2018-12-07 NOTE — Patient Instructions (Addendum)
Go to COVID testing center on [address] tomorrow morning.   Maintain adequate oral hydration with water and pedialyte. Maintain Brat diet x 2days, then advance as tolerated. Use mucinex of delsym or robitussin Dm for cough. Use tylenol for fever and pain. Stop use of imodium. You may use peptobismol if needed for abdominal pain/nausea/diarrhea.  This information is directly available on the CDC website: RunningShows.co.za.html    Source:CDC Reference to specific commercial products, manufacturers, companies, or trademarks does not constitute its endorsement or recommendation by the [place], [place], or Centers for [employer] and Prevention.

## 2018-12-07 NOTE — Progress Notes (Signed)
Virtual Visit via Video Note  I connected with Brenda [name] on 12/07/18 at  2:30 PM EDT by a video enabled telemedicine application and verified that I am speaking with the correct person using two identifiers.  Location: Patient: Home  Provider: Office   I discussed the limitations of evaluation and management by telemedicine and the availability of in person appointments. The patient expressed understanding and agreed to proceed.  CC: nasal congestion, diarrhea and ABD pain x 1week.  History of Present Illness: Cough This is a new problem. The current episode started in the past 7 days. The problem has been unchanged. The cough is non-productive. Associated symptoms include myalgias, nasal congestion, postnasal drip, rhinorrhea and a sore throat. Pertinent negatives include no chest pain, chills, ear congestion, ear pain, fever, headaches, heartburn, hemoptysis, rash, shortness of breath, sweats, weight loss or wheezing. She has tried OTC cough suppressant for the symptoms. The treatment provided mild relief.  Diarrhea  This is a new problem. The current episode started in the past 7 days. The problem occurs less than 2 times per day. The problem has been unchanged. The stool consistency is described as watery. The patient states that diarrhea does not awaken her from sleep. Associated symptoms include abdominal pain, coughing, myalgias and a URI. Pertinent negatives include no arthralgias, bloating, chills, fever, headaches, increased  flatus, sweats, vomiting or weight loss. Risk factors include ill contacts. She has tried anti-motility drug for the symptoms. The treatment provided moderate relief.   Observations/Objective: Physical Exam  Constitutional: She is oriented to person, place, and time. No distress.  Pulmonary/Chest: Effort normal.  Neurological: She is alert and oriented to person, place, and time.  Skin: She is not diaphoretic.  Psychiatric: She has a normal mood and  affect.  Vitals reviewed.  Assessment and Plan: Brenda [name] was seen today for cough.  Diagnoses and all orders for this visit:  Gastroenteritis   Follow Up Instructions: See avs  Go to COVID testing center on [address] tomorrow morning.   I discussed the assessment and treatment plan with the patient. The patient was provided an opportunity to ask questions and all were answered. The patient agreed with the plan and demonstrated an understanding of the instructions.   The patient was advised to call back or seek an in-person evaluation if the symptoms worsen or if the condition fails to improve as anticipated.   Wilfred Lacy, NP

## 2018-12-08 ENCOUNTER — Other Ambulatory Visit: Payer: Self-pay

## 2018-12-08 ENCOUNTER — Ambulatory Visit: Payer: BC Managed Care – PPO

## 2018-12-08 ENCOUNTER — Encounter: Payer: Self-pay | Admitting: Nurse Practitioner

## 2018-12-08 DIAGNOSIS — Z20822 Contact with and (suspected) exposure to covid-19: Secondary | ICD-10-CM

## 2018-12-09 ENCOUNTER — Encounter: Payer: Self-pay | Admitting: Nurse Practitioner

## 2018-12-10 ENCOUNTER — Encounter: Payer: Self-pay | Admitting: Nurse Practitioner

## 2018-12-10 LAB — NOVEL CORONAVIRUS, NAA: SARS-CoV-2, NAA: NOT DETECTED

## 2018-12-26 ENCOUNTER — Other Ambulatory Visit: Payer: Self-pay | Admitting: Nurse Practitioner

## 2018-12-26 DIAGNOSIS — N951 Menopausal and female climacteric states: Secondary | ICD-10-CM

## 2018-12-27 ENCOUNTER — Other Ambulatory Visit: Payer: Self-pay | Admitting: Nurse Practitioner

## 2018-12-27 DIAGNOSIS — N951 Menopausal and female climacteric states: Secondary | ICD-10-CM

## 2019-01-01 ENCOUNTER — Encounter: Payer: Self-pay | Admitting: Gastroenterology

## 2019-02-28 ENCOUNTER — Other Ambulatory Visit: Payer: Self-pay | Admitting: Nurse Practitioner

## 2019-02-28 DIAGNOSIS — I10 Essential (primary) hypertension: Secondary | ICD-10-CM

## 2019-02-28 DIAGNOSIS — E1165 Type 2 diabetes mellitus with hyperglycemia: Secondary | ICD-10-CM

## 2019-03-01 NOTE — Telephone Encounter (Signed)
Pt schedule an appt with Nche on 03/05/2019--virtual visit--pt stated she does not have BP cuff at home but I ask the pt to get obtain the reading 1 day prior of she can for the visit--pt agree.   I denied these rx and please let me know if you want me to do anything different.

## 2019-03-05 ENCOUNTER — Telehealth (INDEPENDENT_AMBULATORY_CARE_PROVIDER_SITE_OTHER): Payer: BC Managed Care – PPO | Admitting: Nurse Practitioner

## 2019-03-05 ENCOUNTER — Other Ambulatory Visit: Payer: Self-pay

## 2019-03-05 ENCOUNTER — Encounter: Payer: Self-pay | Admitting: Nurse Practitioner

## 2019-03-05 VITALS — Ht 62.0 in

## 2019-03-05 DIAGNOSIS — I10 Essential (primary) hypertension: Secondary | ICD-10-CM | POA: Diagnosis not present

## 2019-03-05 DIAGNOSIS — M8949 Other hypertrophic osteoarthropathy, multiple sites: Secondary | ICD-10-CM

## 2019-03-05 DIAGNOSIS — E1165 Type 2 diabetes mellitus with hyperglycemia: Secondary | ICD-10-CM

## 2019-03-05 DIAGNOSIS — M159 Polyosteoarthritis, unspecified: Secondary | ICD-10-CM

## 2019-03-05 DIAGNOSIS — F331 Major depressive disorder, recurrent, moderate: Secondary | ICD-10-CM

## 2019-03-05 DIAGNOSIS — Z1231 Encounter for screening mammogram for malignant neoplasm of breast: Secondary | ICD-10-CM

## 2019-03-05 DIAGNOSIS — F411 Generalized anxiety disorder: Secondary | ICD-10-CM | POA: Diagnosis not present

## 2019-03-05 DIAGNOSIS — K219 Gastro-esophageal reflux disease without esophagitis: Secondary | ICD-10-CM | POA: Diagnosis not present

## 2019-03-05 DIAGNOSIS — E7800 Pure hypercholesterolemia, unspecified: Secondary | ICD-10-CM

## 2019-03-05 DIAGNOSIS — M150 Primary generalized (osteo)arthritis: Secondary | ICD-10-CM

## 2019-03-05 MED ORDER — LISINOPRIL 5 MG PO TABS: 5.0000 mg | ORAL_TABLET | Freq: Every day | ORAL | 1 refills | Status: DC

## 2019-03-05 MED ORDER — VENLAFAXINE HCL ER 150 MG PO CP24: 150.0000 mg | ORAL_CAPSULE | Freq: Every day | ORAL | 3 refills | Status: DC

## 2019-03-05 MED ORDER — DICLOFENAC SODIUM 1 % EX GEL: 2.0000 g | Freq: Four times a day (QID) | CUTANEOUS | 1 refills | Status: DC

## 2019-03-05 MED ORDER — MIRTAZAPINE 15 MG PO TABS: 15.0000 mg | ORAL_TABLET | Freq: Every day | ORAL | 1 refills | Status: DC

## 2019-03-05 MED ORDER — PANTOPRAZOLE SODIUM 20 MG PO TBEC: 20.0000 mg | DELAYED_RELEASE_TABLET | Freq: Every day | ORAL | 1 refills | Status: DC

## 2019-03-05 MED ORDER — AMLODIPINE BESYLATE 5 MG PO TABS: 5.0000 mg | ORAL_TABLET | Freq: Every day | ORAL | 1 refills | Status: DC

## 2019-03-05 NOTE — Progress Notes (Signed)
Virtual Visit via Video Note  I connected with@ on 03/05/19 at 10:00 AM EST by a video enabled telemedicine application and verified that I am speaking with the correct person using two identifiers.  Location: Patient:Home Provider: Office Participants: patient and provider   I connected with "Patient Name" today by a video enabled telemedicine application and verified that I am speaking with the correct person using two identifiers. Location patient: home Location provider: work Persons participating in the virtual visit: patient, provider  I discussed the limitations of evaluation and management by telemedicine and the availability of in person appointments. I also discussed with the patient that there may be a patient responsible charge related to this service. The patient expressed understanding and agreed to proceed.  CC:f/up on chronic conditions.  History of Present Illness: HTN: Unable to evaluate, she does not check BP at home. Reports she is compliant with amlodipine and lisinopril. BP Readings from Last 3 Encounters:  05/12/18 126/72  09/13/17 (!) 146/75  04/28/17 (!) 152/82   DM: lastHgbA1c of 7.2 Current use of metformin and glipizide. Non compliant with diet, elevate glucose readings. Positive urine microalbumin, LDL not at goal. Current use of ACE and statin. She has not schedule eye exam   Anxiety and depression: Stable mood with effexor and remeron.  Hot flashes: Improved with estrogen patch. Did not schedule mammogram as recommended.  Observations/Objective: Physical Exam  Constitutional: She is oriented to person, place, and time. No distress.  Pulmonary/Chest: Effort normal.  Neurological: She is alert and oriented to person, place, and time.  Psychiatric: She has a normal mood and affect. Her behavior is normal. Thought content normal.   Assessment and Plan: Brenda [name] was seen today for follow-up.  Diagnoses and all orders for this  visit:  ANXIETY DISORDER, GENERALIZED -     venlafaxine XR (EFFEXOR-XR) 150 MG 24 hr capsule; Take 1 capsule (150 mg total) by mouth daily with breakfast. -     mirtazapine (REMERON) 15 MG tablet; Take 1 tablet (15 mg total) by mouth at bedtime.  Moderate episode of recurrent major depressive disorder (HCC) -     venlafaxine XR (EFFEXOR-XR) 150 MG 24 hr capsule; Take 1 capsule (150 mg total) by mouth daily with breakfast. -     mirtazapine (REMERON) 15 MG tablet; Take 1 tablet (15 mg total) by mouth at bedtime.  Essential hypertension, benign -     TSH; Future -     Basic metabolic panel; Future -     amLODipine (NORVASC) 5 MG tablet; Take 1 tablet (5 mg total) by mouth daily. -     lisinopril (ZESTRIL) 5 MG tablet; Take 1 tablet (5 mg total) by mouth daily.  Gastroesophageal reflux disease without esophagitis -     pantoprazole (PROTONIX) 20 MG tablet; Take 1 tablet (20 mg total) by mouth daily.  Type 2 diabetes mellitus with hyperglycemia, without long-term current use of insulin (HCC) -     Hepatic function panel; Future -     Hemoglobin A1c; Future -     lisinopril (ZESTRIL) 5 MG tablet; Take 1 tablet (5 mg total) by mouth daily.  Primary osteoarthritis involving multiple joints -     diclofenac Sodium (VOLTAREN) 1 % GEL; Apply 2 g topically 4 (four) times daily.  HYPERCHOLESTEROLEMIA -     Lipid panel; Future  Breast cancer screening by mammogram -     MM DIGITAL SCREENING BILATERAL; Future    Follow Up Instructions: See avs   I  discussed the assessment and treatment plan with the patient. The patient was provided an opportunity to ask questions and all were answered. The patient agreed with the plan and demonstrated an understanding of the instructions.   The patient was advised to call back or seek an in-person evaluation if the symptoms worsen or if the condition fails to improve as anticipated.   Wilfred Lacy, NP

## 2019-03-08 ENCOUNTER — Ambulatory Visit: Payer: BC Managed Care – PPO | Admitting: Nurse Practitioner

## 2019-03-08 ENCOUNTER — Other Ambulatory Visit: Payer: BC Managed Care – PPO

## 2019-03-08 ENCOUNTER — Encounter: Payer: Self-pay | Admitting: Nurse Practitioner

## 2019-03-12 ENCOUNTER — Other Ambulatory Visit (INDEPENDENT_AMBULATORY_CARE_PROVIDER_SITE_OTHER): Payer: BC Managed Care – PPO

## 2019-03-12 ENCOUNTER — Other Ambulatory Visit: Payer: Self-pay

## 2019-03-12 ENCOUNTER — Ambulatory Visit (INDEPENDENT_AMBULATORY_CARE_PROVIDER_SITE_OTHER): Payer: BC Managed Care – PPO

## 2019-03-12 ENCOUNTER — Encounter: Payer: Self-pay | Admitting: Nurse Practitioner

## 2019-03-12 VITALS — BP 146/82 | HR 93 | Temp 96.2°F | Ht 62.0 in | Wt 149.2 lb

## 2019-03-12 DIAGNOSIS — I10 Essential (primary) hypertension: Secondary | ICD-10-CM

## 2019-03-12 DIAGNOSIS — E7800 Pure hypercholesterolemia, unspecified: Secondary | ICD-10-CM | POA: Diagnosis not present

## 2019-03-12 DIAGNOSIS — Z23 Encounter for immunization: Secondary | ICD-10-CM

## 2019-03-12 DIAGNOSIS — E1165 Type 2 diabetes mellitus with hyperglycemia: Secondary | ICD-10-CM | POA: Diagnosis not present

## 2019-03-12 LAB — BASIC METABOLIC PANEL
BUN: 13 mg/dL (ref 6–23)
CO2: 31 mEq/L (ref 19–32)
Calcium: 9.6 mg/dL (ref 8.4–10.5)
Chloride: 101 mEq/L (ref 96–112)
Creatinine, Ser: 0.84 mg/dL (ref 0.40–1.20)
GFR: 84.98 mL/min (ref >60.00)
Glucose, Bld: 179 mg/dL — ABNORMAL HIGH (ref 70–99)
Potassium: 4.3 mEq/L (ref 3.5–5.1)
Sodium: 136 mEq/L (ref 135–145)

## 2019-03-12 LAB — LIPID PANEL
Cholesterol: 210 mg/dL — ABNORMAL HIGH (ref 0–200)
HDL: 48.7 mg/dL (ref >39.00)
LDL Cholesterol: 130 mg/dL — ABNORMAL HIGH (ref 0–99)
NonHDL: 161.63
Total CHOL/HDL Ratio: 4
Triglycerides: 160 mg/dL — ABNORMAL HIGH (ref 0.0–149.0)
VLDL: 32 mg/dL (ref 0.0–40.0)

## 2019-03-12 LAB — HEPATIC FUNCTION PANEL
ALT: 11 U/L (ref 0–35)
AST: 10 U/L (ref 0–37)
Albumin: 4.5 g/dL (ref 3.5–5.2)
Alkaline Phosphatase: 64 U/L (ref 39–117)
Bilirubin, Direct: 0 mg/dL (ref 0.0–0.3)
Total Bilirubin: 0.3 mg/dL (ref 0.2–1.2)
Total Protein: 7.2 g/dL (ref 6.0–8.3)

## 2019-03-12 LAB — HEMOGLOBIN A1C: Hgb A1c MFr Bld: 7.8 % — ABNORMAL HIGH (ref 4.6–6.5)

## 2019-03-12 LAB — TSH: TSH: 0.87 u[IU]/mL (ref 0.35–4.50)

## 2019-03-12 NOTE — Progress Notes (Signed)
Pt came into the office to get a flu shot today, gave injection into the left deltoid, pt tolerated injection well.

## 2019-03-13 ENCOUNTER — Telehealth: Payer: Self-pay | Admitting: Nurse Practitioner

## 2019-03-13 MED ORDER — ATORVASTATIN CALCIUM 40 MG PO TABS: 40.0000 mg | ORAL_TABLET | Freq: Every day | ORAL | 1 refills | Status: DC

## 2019-03-13 NOTE — Addendum Note (Signed)
Addended by: Wilfred Lacy L on: 03/13/2019 11:40 PM   Modules accepted: Orders

## 2019-03-13 NOTE — Telephone Encounter (Signed)
-----   Message from Shawnie Pons, LPN sent at [phone]  2:59 PM EST ----- Pt came into the office today for lab work, nurse visit for a flu shot. Pt vital sign as of today : 149.2lb 96.2 tamp 98 oxygen 93 heart rate 146/82 for BP  This is in nurse visit vital sign section as well. Please advise

## 2019-03-13 NOTE — Telephone Encounter (Signed)
Thank you. See results note

## 2019-03-19 ENCOUNTER — Other Ambulatory Visit: Payer: Self-pay | Admitting: Nurse Practitioner

## 2019-03-19 DIAGNOSIS — E1165 Type 2 diabetes mellitus with hyperglycemia: Secondary | ICD-10-CM

## 2019-03-19 NOTE — Telephone Encounter (Signed)
Patient is calling requesting a refill for glipizide, diclofenac and venlafaxine sent to [facility] on [address] in [place]. CB is [phone]

## 2019-04-20 ENCOUNTER — Ambulatory Visit: Payer: BC Managed Care – PPO

## 2019-05-19 ENCOUNTER — Other Ambulatory Visit: Payer: Self-pay

## 2019-05-20 ENCOUNTER — Ambulatory Visit: Payer: BC Managed Care – PPO | Admitting: Nurse Practitioner

## 2019-05-20 DIAGNOSIS — Z0289 Encounter for other administrative examinations: Secondary | ICD-10-CM

## 2019-06-22 ENCOUNTER — Ambulatory Visit: Payer: BC Managed Care – PPO | Admitting: Nurse Practitioner

## 2019-06-22 DIAGNOSIS — Z0289 Encounter for other administrative examinations: Secondary | ICD-10-CM

## 2019-06-27 ENCOUNTER — Emergency Department (HOSPITAL_BASED_OUTPATIENT_CLINIC_OR_DEPARTMENT_OTHER)
Admission: EM | Admit: 2019-06-27 | Discharge: 2019-06-27 | Disposition: A | Discharge status: Home / Self Care | Payer: BC Managed Care – PPO | Attending: Emergency Medicine | Admitting: Emergency Medicine

## 2019-06-27 ENCOUNTER — Other Ambulatory Visit: Payer: Self-pay

## 2019-06-27 ENCOUNTER — Encounter (HOSPITAL_BASED_OUTPATIENT_CLINIC_OR_DEPARTMENT_OTHER): Payer: Self-pay | Admitting: Emergency Medicine

## 2019-06-27 ENCOUNTER — Emergency Department (HOSPITAL_BASED_OUTPATIENT_CLINIC_OR_DEPARTMENT_OTHER): Payer: BC Managed Care – PPO

## 2019-06-27 DIAGNOSIS — I10 Essential (primary) hypertension: Secondary | ICD-10-CM | POA: Insufficient documentation

## 2019-06-27 DIAGNOSIS — M25511 Pain in right shoulder: Secondary | ICD-10-CM | POA: Diagnosis present

## 2019-06-27 DIAGNOSIS — R112 Nausea with vomiting, unspecified: Secondary | ICD-10-CM | POA: Diagnosis not present

## 2019-06-27 DIAGNOSIS — Z7984 Long term (current) use of oral hypoglycemic drugs: Secondary | ICD-10-CM | POA: Diagnosis not present

## 2019-06-27 DIAGNOSIS — E119 Type 2 diabetes mellitus without complications: Secondary | ICD-10-CM | POA: Diagnosis not present

## 2019-06-27 DIAGNOSIS — Z79899 Other long term (current) drug therapy: Secondary | ICD-10-CM | POA: Insufficient documentation

## 2019-06-27 DIAGNOSIS — E7800 Pure hypercholesterolemia, unspecified: Secondary | ICD-10-CM | POA: Insufficient documentation

## 2019-06-27 LAB — URINALYSIS, ROUTINE W REFLEX MICROSCOPIC
Bilirubin Urine: NEGATIVE
Glucose, UA: >=500 mg/dL — AB
Ketones, ur: 15 mg/dL — AB
Leukocytes,Ua: NEGATIVE
Nitrite: NEGATIVE
Protein, ur: 100 mg/dL — AB
Specific Gravity, Urine: 1.025 (ref 1.005–1.030)
pH: 6.5 (ref 5.0–8.0)

## 2019-06-27 LAB — URINALYSIS, MICROSCOPIC (REFLEX)

## 2019-06-27 LAB — PREGNANCY, URINE: Preg Test, Ur: NEGATIVE

## 2019-06-27 MED ORDER — PREDNISONE 10 MG PO TABS
20.0000 mg | ORAL_TABLET | Freq: Two times a day (BID) | ORAL | 0 refills | Status: DC
Start: 2019-06-27 — End: 2019-07-01

## 2019-06-27 MED ORDER — TRAMADOL HCL 50 MG PO TABS: 50.0000 mg | ORAL_TABLET | Freq: Four times a day (QID) | ORAL | 0 refills | Status: DC | PRN

## 2019-06-27 NOTE — ED Provider Notes (Signed)
[facility] EMERGENCY DEPARTMENT Provider Note   CSN: [id number] Arrival date & time: 06/27/19  2595     History No chief complaint on file.   Brenda [name] is a 54 y.o. female.  Patient is a 54 year old female with past medical history of diabetes, hypertension, hyperlipidemia.  She presents today with a 25-monthhistory of pain in her right shoulder.  This began in the absence of any specific injury or trauma.  She states she does do repetitive movements at work.  She has been taking Aleve and over-the-counter NSAIDs with little relief.  Yesterday her stomach became upset after taking these medications and she vomited several times.  She denies any abdominal pain or diarrhea.  She denies any fevers or chills.  She denies any chest pain or difficulty breathing.  Her pain is worse with movement, especially abduction and external rotation.  She denies any numbness or tingling.  The history is provided by the patient.       Past Medical History:  Diagnosis Date  . Allergic rhinitis, cause unspecified   . Anemia, unspecified   . Diabetes mellitus without complication ([place]   . Esophageal reflux   . Generalized anxiety disorder   . Headache(784.0)   . Hypertension   . Pure hypercholesterolemia   . Rash and other nonspecific skin eruption   . Unspecified vitamin D deficiency     Patient Active Problem List   Diagnosis Date Noted  . Hepatic cyst 05/12/2018  . Routine general medical examination at a health care facility 07/16/2016  . Right knee pain 12/26/2015  . Type 2 diabetes mellitus with hyperglycemia, without long-term current use of insulin ([place] 10/17/2015  . Depression 05/16/2014  . Essential hypertension, benign 03/08/2013  . DJD (degenerative joint disease) 06/16/2012  . HYPERCHOLESTEROLEMIA 08/18/2008  . ANEMIA, MILD 08/18/2008  . ANXIETY DISORDER, GENERALIZED 12/11/2006  . ALLERGIC RHINITIS 12/11/2006  . GERD 12/11/2006    Past Surgical History:    Procedure Laterality Date  . TENDON REPAIR  1990   left index finger     OB History   No obstetric history on file.     Family History  Problem Relation Age of Onset  . Heart disease Maternal Grandfather   . Asthma Mother   . Diabetes Mother   . Arthritis Mother   . Alcohol abuse Mother   . Asthma Sister   . Diabetes Sister   . Allergies Sister     Social History   Tobacco Use  . Smoking status: Never Smoker  . Smokeless tobacco: Never Used  Substance Use Topics  . Alcohol use: Yes    Comment: occasionally  . Drug use: No    Home Medications Prior to Admission medications   Medication Sig Start Date End Date Taking? Authorizing Provider  albuterol (PROVENTIL HFA;VENTOLIN HFA) 108 (90 Base) MCG/ACT inhaler Inhale 2 puffs into the lungs every 6 (six) hours as needed for wheezing or shortness of breath. 07/09/17   Nche, CCharlene Brooke NP  amLODipine (NORVASC) 5 MG tablet Take 1 tablet (5 mg total) by mouth daily. 03/05/19   Nche, CCharlene Brooke NP  atorvastatin (LIPITOR) 40 MG tablet Take 1 tablet (40 mg total) by mouth daily at 6 PM. 03/13/19   Nche, CCharlene Brooke NP  blood glucose meter kit and supplies KIT Dispense based on patient and insurance preference.  Use before breakfast and at bedtime. E11.4 Patient not taking: Reported on 06/01/2018 09/30/16   Nche, CCharlene Brooke NP  diclofenac Sodium (VOLTAREN) 1 % GEL Apply 2 g topically 4 (four) times daily. 03/05/19   Nche, Charlene Brooke, NP  estradiol (CLIMARA - DOSED IN MG/24 HR) 0.025 mg/24hr patch Place 1 patch (0.025 mg total) onto the skin once a week. Needs to schedule mammogram for additional refills 12/28/18   Nche, Charlene Brooke, NP  fluticasone (FLONASE) 50 MCG/ACT nasal spray Place 2 sprays into both nostrils daily. 04/28/17   Nche, Charlene Brooke, NP  glipiZIDE (GLUCOTROL XL) 5 MG 24 hr tablet TAKE 1 TABLET BY MOUTH EVERY DAY WITH BREAKFAST 03/19/19   Nche, Charlene Brooke, NP  glucose blood test strip Use before  breakfast and at bedtime. 09/09/18   Nche, Charlene Brooke, NP  lisinopril (ZESTRIL) 5 MG tablet Take 1 tablet (5 mg total) by mouth daily. 03/05/19   Nche, Charlene Brooke, NP  mirtazapine (REMERON) 15 MG tablet Take 1 tablet (15 mg total) by mouth at bedtime. 03/05/19   Nche, Charlene Brooke, NP  pantoprazole (PROTONIX) 20 MG tablet Take 1 tablet (20 mg total) by mouth daily. 03/05/19   Nche, Charlene Brooke, NP  venlafaxine XR (EFFEXOR-XR) 150 MG 24 hr capsule Take 1 capsule (150 mg total) by mouth daily with breakfast. 03/05/19   Nche, Charlene Brooke, NP  omeprazole (PRILOSEC OTC) 20 MG tablet Take 1 tablet (20 mg total) by mouth daily. 02/15/11 03/05/19  Noralee Space, MD    Allergies    Codeine phosphate  Review of Systems   Review of Systems  All other systems reviewed and are negative.   Physical Exam Updated Vital Signs BP (!) 144/77   Pulse 96   Temp 97.8 F (36.6 C) (Oral)   Resp 16   Ht 5' 2"  (1.575 m)   Wt 68 kg   LMP 11/26/2013   SpO2 100%   BMI 27.44 kg/m   Physical Exam Vitals and nursing note reviewed.  Constitutional:      General: She is not in acute distress.    Appearance: She is well-developed. She is not diaphoretic.  HENT:     Head: Normocephalic and atraumatic.  Cardiovascular:     Rate and Rhythm: Normal rate and regular rhythm.     Heart sounds: No murmur. No friction rub. No gallop.   Pulmonary:     Effort: Pulmonary effort is normal. No respiratory distress.     Breath sounds: Normal breath sounds. No wheezing.  Abdominal:     General: Bowel sounds are normal. There is no distension.     Palpations: Abdomen is soft.     Tenderness: There is no abdominal tenderness.  Musculoskeletal:        General: Normal range of motion.     Cervical back: Normal range of motion and neck supple.     Comments: The right shoulder appears grossly normal.  There is tenderness over the anterior and posterior aspect of the deltoid.  Ulnar and radial pulses are easily palpable  and motor and sensation are intact throughout the entire hand.  Her pain is exacerbated with abduction and external rotation.  Skin:    General: Skin is warm and dry.  Neurological:     Mental Status: She is alert and oriented to person, place, and time.     ED Results / Procedures / Treatments   Labs (all labs ordered are listed, but only abnormal results are displayed) Labs Reviewed  URINALYSIS, ROUTINE W REFLEX MICROSCOPIC - Abnormal; Notable for the following components:  Result Value   Glucose, UA >=500 (*)    Hgb urine dipstick SMALL (*)    Ketones, ur 15 (*)    Protein, ur 100 (*)    All other components within normal limits  URINALYSIS, MICROSCOPIC (REFLEX) - Abnormal; Notable for the following components:   Bacteria, UA FEW (*)    All other components within normal limits  PREGNANCY, URINE    EKG None  Radiology No results found.  Procedures Procedures (including critical care time)  Medications Ordered in ED Medications - No data to display  ED Course  I have reviewed the triage vital signs and the nursing notes.  Pertinent labs & imaging results that were available during my care of the patient were reviewed by me and considered in my medical decision making (see chart for details).    MDM Rules/Calculators/A&P  Patient presenting today with complaints of right shoulder pain that began in the absence of any specific injury or trauma.  This has been worsening over the past 2 months, but has been worse recently.  She has been taking NSAIDs and then had episodes of vomiting yesterday evening.  I suspect the vomiting is related to NSAID use as she is having no abdominal pain and her abdominal exam is benign.  X-rays show calcifications at the insertion site of the rotator cuff and I suspect patient has a rotator cuff tendinitis.  She will be placed in an arm sling and treated with a course of steroids.  She is to follow-up with primary doctor if she is not  improving in the next week.  Final Clinical Impression(s) / ED Diagnoses Final diagnoses:  None    Rx / DC Orders ED Discharge Orders    None       Veryl Speak, MD 06/27/19 [id number]

## 2019-06-27 NOTE — ED Notes (Signed)
EDP at bedside  

## 2019-06-27 NOTE — Discharge Instructions (Addendum)
Begin taking prednisone as prescribed.  Wear arm sling for the next several days, then slowly begin to reintroduce activity.  Follow-up with primary doctor if symptoms are not improving in the next week.

## 2019-06-27 NOTE — ED Notes (Signed)
MD aware of BP, pt reports she is picking up her BP medication when she picks up her other medication and will take it when she gets home.

## 2019-06-27 NOTE — ED Triage Notes (Signed)
Pt reports right arm pain x1-2 weeks. States progressively worse, especially bad since Friday. States vomiting since yesterday. States no diarrhea, reports abd cramping.

## 2019-06-27 NOTE — ED Notes (Signed)
Patient transported to X-ray 

## 2019-06-29 ENCOUNTER — Telehealth: Payer: Self-pay | Admitting: Nurse Practitioner

## 2019-06-29 NOTE — Telephone Encounter (Signed)
Patient  Is calling and requesting a call back. CB is [phone]

## 2019-06-29 NOTE — Telephone Encounter (Signed)
Her work indicates she should return to work on 06/30/2019 and not 06/28/2019. I will not change her work note at this time. If she is not improving I will recommend referral to sports medicine.

## 2019-06-29 NOTE — Telephone Encounter (Signed)
Pt called stating she was seen in the ER on 06/27/19 for acute right shoulder pain. The ER took her out of work till tomorrow 06/28/19 but pt has an appointment with Baldo Ash on 07/01/19 and states she still is in pain and wondering if she can stay out till after you see her and make sure she is okay to return to work.

## 2019-06-30 NOTE — Telephone Encounter (Signed)
Left pt detailed voicemail per DPR and explained how she won't be getting a work note but if still not improving a sports medicine referral will be given.   Pt has an appointment tomorrow 07/01/19.

## 2019-07-01 ENCOUNTER — Telehealth (INDEPENDENT_AMBULATORY_CARE_PROVIDER_SITE_OTHER): Payer: BC Managed Care – PPO | Admitting: Nurse Practitioner

## 2019-07-01 ENCOUNTER — Other Ambulatory Visit: Payer: Self-pay

## 2019-07-01 ENCOUNTER — Encounter: Payer: Self-pay | Admitting: Nurse Practitioner

## 2019-07-01 ENCOUNTER — Other Ambulatory Visit: Payer: Self-pay | Admitting: Nurse Practitioner

## 2019-07-01 VITALS — Ht 62.0 in | Wt 149.0 lb

## 2019-07-01 DIAGNOSIS — M778 Other enthesopathies, not elsewhere classified: Secondary | ICD-10-CM | POA: Diagnosis not present

## 2019-07-01 DIAGNOSIS — R110 Nausea: Secondary | ICD-10-CM | POA: Diagnosis not present

## 2019-07-01 DIAGNOSIS — E1165 Type 2 diabetes mellitus with hyperglycemia: Secondary | ICD-10-CM

## 2019-07-01 MED ORDER — NOVOLIN 70/30 FLEXPEN (70-30) 100 UNIT/ML ~~LOC~~ SUPN: 10.0000 [IU] | PEN_INJECTOR | Freq: Two times a day (BID) | SUBCUTANEOUS | 1 refills | Status: DC

## 2019-07-01 MED ORDER — ONDANSETRON HCL 4 MG PO TABS: 4.0000 mg | ORAL_TABLET | Freq: Three times a day (TID) | ORAL | 0 refills | Status: DC | PRN

## 2019-07-01 MED ORDER — PEN NEEDLES 31G X 6 MM MISC: 1.0000 "application " | Freq: Every day | 2 refills | Status: DC

## 2019-07-01 NOTE — Patient Instructions (Addendum)
Maintain adequate oral hydration with water. Check glucose BID before meals. Hold glipizide and oral prednisone Start Novolin injection You will be contacted to schedule appt with sports medicine.  Bland Diet A bland diet consists of foods that are often soft and do not have a lot of fat, fiber, or extra seasonings. Foods without fat, fiber, or seasoning are easier for the body to digest. They are also less likely to irritate your mouth, throat, stomach, and other parts of your digestive system. A bland diet is sometimes called a BRAT diet. What is my plan? Your health care provider or food and nutrition specialist (dietitian) may recommend specific changes to your diet to prevent symptoms or to treat your symptoms. These changes may include:  Eating small meals often.  Cooking food until it is soft enough to chew easily.  Chewing your food well.  Drinking fluids slowly.  Not eating foods that are very spicy, sour, or fatty.  Not eating citrus fruits, such as oranges and grapefruit. What do I need to know about this diet?  Eat a variety of foods from the bland diet food list.  Do not follow a bland diet longer than needed.  Ask your health care provider whether you should take vitamins or supplements. What foods can I eat? Grains  Hot cereals, such as cream of wheat. Rice. Bread, crackers, or tortillas made from refined white flour. Vegetables Canned or cooked vegetables. Mashed or boiled potatoes. Fruits  Bananas. Applesauce. Other types of cooked or canned fruit with the skin and seeds removed, such as canned peaches or pears. Meats and other proteins  Scrambled eggs. Creamy peanut butter or other nut butters. Lean, well-cooked meats, such as chicken or fish. Tofu. Soups or broths. Dairy Low-fat dairy products, such as milk, cottage cheese, or yogurt. Beverages  Water. Herbal tea. Apple juice. Fats and oils Mild salad dressings. Canola or olive oil. Sweets and  desserts Pudding. Custard. Fruit gelatin. Ice cream. The items listed above may not be a complete list of recommended foods and beverages. Contact a dietitian for more options. What foods are not recommended? Grains Whole grain breads and cereals. Vegetables Raw vegetables. Fruits Raw fruits, especially citrus, berries, or dried fruits. Dairy Whole fat dairy foods. Beverages Caffeinated drinks. Alcohol. Seasonings and condiments Strongly flavored seasonings or condiments. Hot sauce. Salsa. Other foods Spicy foods. Fried foods. Sour foods, such as pickled or fermented foods. Foods with high sugar content. Foods high in fiber. The items listed above may not be a complete list of foods and beverages to avoid. Contact a dietitian for more information. Summary  A bland diet consists of foods that are often soft and do not have a lot of fat, fiber, or extra seasonings.  Foods without fat, fiber, or seasoning are easier for the body to digest.  Check with your health care provider to see how long you should follow this diet plan. It is not meant to be followed for long periods. This information is not intended to replace advice given to you by your health care provider. Make sure you discuss any questions you have with your health care provider. Document Revised: 02/26/2017 Document Reviewed: 02/26/2017 Elsevier Patient Education  2020 [employer].   Hyperglycemic Hyperosmolar State Hyperglycemic hyperosmolar state is a serious condition in which you experience an extreme increase in your blood sugar (glucose) level. This makes your body become extremely dehydrated, which can be life-threatening. This condition is a result of uncontrolled or undiagnosed diabetes. It  occurs most often in people who have type 2 diabetes (type 2 diabetes mellitus). Certain hormones (insulin and glucagon) control the level of glucose that is in the blood. Insulin lowers blood glucose, and glucagon increases  blood glucose. Hyperglycemia can result from having too little insulin in the bloodstream, or from the body not responding normally to insulin. Normally, the body gets rid of excess glucose through urine. If you do not drink enough fluids, or if you drink fluids that contain sugar, your body cannot get rid of excess glucose. This can result in hyperglycemic hyperosmolar state. What are the causes? This condition may be caused by:  Infection.  Medicines that cause you to become dehydrated or cause you to lose fluid.  Certain illnesses.  Not taking your diabetes medicine.  New onset or diagnosis of diabetes.  Cardiovascular disease (CVD). What increases the risk? The following factors make you more likely to develop this condition:  Older age.  Poor management of diabetes.  Inability to eat or drink normally.  Heart failure.  Infection.  Surgery.  Illness. What are the signs or symptoms? Symptoms of this condition include:  Extreme or increased thirst. This symptom may gradually disappear.  Needing to urinate more often than usual.  Dry mouth.  Warm, dry skin that does not sweat even in high temperatures.  High fever.  Sleepiness or confusion.  Vision problems or vision loss.  Seeing, hearing, tasting, smelling, or feeling things that are not real (hallucinations).  Weakness.  Weight loss.  Vomiting. How is this diagnosed? Hyperglycemic hyperosmolar state is diagnosed based on your medical history, your symptoms, and a blood test to measure your blood glucose level. How is this treated? This condition is treated in the hospital. The goals of treatment are:  To correct dehydration by replacing fluids that you have lost. Fluids will be given through an IV tube.  To improve blood sugar levels using insulin or other medicines as needed.  To treat the cause of hyperglycemia, such as an infection, illness, or newly diagnosed diabetes. Follow these instructions  at home: General instructions  Take over-the-counter and prescription medicines only as told by your health care provider.  Do not use any products that contain nicotine or tobacco, such as cigarettes and e-cigarettes. If you need help quitting, ask your health care provider.  Limit alcohol intake to no more than 1 drink a day for nonpregnant women and 2 drinks a day for men. One drink equals 12 oz of beer, 5 oz of wine, or 1 oz of hard liquor.  Stay hydrated, especially when you exercise, when you get sick, or when you spend time in hot temperatures.  Learn to manage stress. If you need help with this, ask your health care provider.  Keep all follow-up visits as told by your health care provider. This is important. Eating and drinking   Maintain a healthy weight.  Exercise regularly, as directed by your health care provider.  Eat healthy foods, such as: ? Lean proteins. ? Complex carbohydrates. ? Fresh fruits and vegetables. ? Low-fat dairy products. ? Healthy fats.  Drink enough fluid to keep your urine clear or pale yellow. If You Have Diabetes:   Make sure you know the early signs and symptoms of hyperglycemia.  Follow your diabetes management plan, as told by your health care provider. Make sure you: ? Take your insulin and medicines as directed. ? Follow your exercise plan. ? Follow your meal plan. Eat on time, and do not  skip meals. ? Check your blood glucose as often as directed. Make sure to check your blood glucose before and after exercise. If you exercise longer or in a different way than usual, check your blood glucose more often. ? Follow your sick day plan whenever you cannot eat or drink normally. Make this plan in advance with your health care provider.  Share your diabetes management plan with people in your workplace, school, and household.  Check your urine for ketones when you are ill and as often as told by your health care provider.  Carry a  medical alert card or wear medical alert jewelry. Contact a health care provider if:  You cannot eat or drink without throwing up.  You develop a fever. Get help right away if:  You develop symptoms of hyperglycemic hyperosmolar state. These symptoms may represent a serious problem that is an emergency. Do not wait to see if the symptoms will go away. Get medical help right away. Call your local emergency services (911 in the [place] Do not drive yourself to the hospital. Summary  Hyperglycemic hyperosmolar state is a serious condition in which you experience an extreme increase in your blood sugar (glucose) level. This makes your body become extremely dehydrated, which can be life-threatening.  This condition is a result of uncontrolled or undiagnosed diabetes. It occurs most often in people who have type 2 diabetes (type 2 diabetes mellitus).  This condition is treated in the hospital. Treatment may include fluids given through an IV tube and other medicines.  Make sure you know the early signs and symptoms of hyperglycemia.  Follow your diabetes management plan, as told by your health care provider. This information is not intended to replace advice given to you by your health care provider. Make sure you discuss any questions you have with your health care provider. Document Revised: 01/22/2016 Document Reviewed: 01/22/2016 Elsevier Patient Education  2020 [employer].

## 2019-07-01 NOTE — Progress Notes (Signed)
Virtual Visit via Video Note  I connected with@ on 07/01/19 at 11:00 AM EDT by a video enabled telemedicine application and verified that I am speaking with the correct person using two identifiers.  Location: Patient:Home Provider: Office Participants: patient and provider  I discussed the limitations of evaluation and management by telemedicine and the availability of in person appointments. I also discussed with the patient that there may be a patient responsible charge related to this service. The patient expressed understanding and agreed to proceed.  CC:pt report still having acute shoulder pain and throwing up but thinks due to pain//pt reported high sugar her sugar was 405 this morn at last check//no eye//no colonoscopy//first covid shot  History of Present Illness: DM: Unable to provide any vital signs. Reports fasting glucose readings:300-400 associated with nausea Current use of glipizide Admits to diet discrepancy.  Right shoulder pain: Reports persistent right shoulder pain with limited ROM. She did not start oral prednisone as prescribed by ED provider. Current use of motrin with minimal relief.   Observations/Objective: Physical Exam  Constitutional: She is oriented to person, place, and time. No distress.  Pulmonary/Chest: Effort normal.  Neurological: She is alert and oriented to person, place, and time.   Assessment and Plan: Brenda [name] was seen today for hospitalization follow-up.  Diagnoses and all orders for this visit:  Type 2 diabetes mellitus with hyperglycemia, without long-term current use of insulin (HCC) -     insulin isophane & regular human (NOVOLIN 70/30 FLEXPEN) (70-30) 100 UNIT/ML KwikPen; Inject 10 Units into the skin 2 (two) times daily with a meal. -     Insulin Pen Needle (PEN NEEDLES) 31G X 6 MM MISC; 1 application by Does not apply route at bedtime.  Right shoulder tendonitis -     Ambulatory referral to Sports Medicine  Nausea -      ondansetron (ZOFRAN) 4 MG tablet; Take 1 tablet (4 mg total) by mouth every 8 (eight) hours as needed for nausea or vomiting.   Follow Up Instructions: Maintain adequate oral hydration with water. Check glucose BID before meals. Hold glipizide and oral prednisone Start Novolin injection You will be contacted to schedule appt with sports medicine F/up in office on Monday   I discussed the assessment and treatment plan with the patient. The patient was provided an opportunity to ask questions and all were answered. The patient agreed with the plan and demonstrated an understanding of the instructions.   The patient was advised to call back or seek an in-person evaluation if the symptoms worsen or if the condition fails to improve as anticipated.  Wilfred Lacy, NP

## 2019-07-02 ENCOUNTER — Ambulatory Visit: Payer: BC Managed Care – PPO | Admitting: Family Medicine

## 2019-07-07 ENCOUNTER — Encounter: Payer: Self-pay | Admitting: Family Medicine

## 2019-07-07 ENCOUNTER — Other Ambulatory Visit: Payer: Self-pay

## 2019-07-07 ENCOUNTER — Ambulatory Visit: Payer: Self-pay

## 2019-07-07 ENCOUNTER — Ambulatory Visit (INDEPENDENT_AMBULATORY_CARE_PROVIDER_SITE_OTHER): Payer: BC Managed Care – PPO | Admitting: Family Medicine

## 2019-07-07 VITALS — BP 122/74 | HR 93 | Ht 62.0 in | Wt 146.6 lb

## 2019-07-07 DIAGNOSIS — M25511 Pain in right shoulder: Secondary | ICD-10-CM | POA: Diagnosis not present

## 2019-07-07 NOTE — Progress Notes (Signed)
Subjective:    I'm seeing this patient as a consultation for:  Wilfred Lacy, NP. Note will be routed back to referring provider/PCP.  CC: R shoulder pain  I, Molly Weber, LAT, ATC, am serving as scribe for Dr. [name].  HPI: Pt is a 54 y/o female presenting w/ c/o R shoulder pain x approximately 2 months w/ no known MOI.  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  She locates her pain to her R lateral shoulder.  She rates her pain as severe and describes her pain as aching.  She works at [employer] the job that requires heavy use of her right arm.  She has been unable to work recently because of shoulder pain.  Radiating pain: yes into the R lateral upper arm R shoulder mechanical symptoms: yes Aggravating factors: L sidelying; overhead R shoulder ROM; functional IR Treatments tried: prednisone and tramadol from [facility] ED visit on 06/27/19; [facility]  Diagnostic testing: R shoulder XR- 06/27/19  Past medical history, Surgical history, Family history, Social history, Allergies, and medications have been entered into the medical record, reviewed.   Review of Systems: No new headache, visual changes, nausea, vomiting, diarrhea, constipation, dizziness, abdominal pain, skin rash, fevers, chills, night sweats, weight loss, swollen lymph nodes, body aches, joint swelling, muscle aches, chest pain, shortness of breath, mood changes, visual or auditory hallucinations.   Objective:    Vitals:   07/07/19 1605  BP: 122/74  Pulse: 93  SpO2: 97%   General: Well Developed, well nourished, and in no acute distress.  Neuro/Psych: Alert and oriented x3, extra-ocular muscles intact, able to move all 4 extremities, sensation grossly intact. Skin: Warm and dry, no rashes noted.  Respiratory: Not using accessory muscles, speaking in full sentences, trachea midline.  Cardiovascular: Pulses palpable, no extremity  edema. Abdomen: Does not appear distended. MSK: C-spine normal-appearing nontender normal cervical motion. Right shoulder normal-appearing mildly tender right trapezius and rhomboid otherwise nontender. Range of motion abduction limited 100 degrees active full passive. External rotation full.  Internal rotation to lumbar spine. Strength intact abduction external and internal rotation. Positive empty can test Hawkins and Neer's test. Negative Yergason's and speeds test. Pulses capillary fill and sensation are intact distally.  Lab and Radiology Results DG Shoulder Right  Result Date: 06/27/2019 CLINICAL DATA:  RIGHT shoulder pain, worsening. EXAM: RIGHT SHOULDER - 2+ VIEW COMPARISON:  None. FINDINGS: Osseous alignment is normal. No fracture line or displaced fracture fragment. No acute or suspicious osseous lesion. Faint calcifications overlying the superior-lateral margin of the RIGHT humeral head, likely chronic calcific tendinosis of the rotator cuff insertion. No evidence of significant DJD at the glenohumeral or acromioclavicular joint spaces. IMPRESSION: 1. No acute findings. 2. Faint calcifications overlying the superior-lateral margin of the RIGHT humeral head, likely chronic calcific tendinosis of the rotator cuff insertion. Electronically Signed   By: Franki Cabot M.D.   On: 06/27/2019 08:00   I, [name], personally (independently) visualized and performed the interpretation of the images attached in this note.  Diagnostic Limited MSK Ultrasound of: Right shoulder Biceps tendon intact normal-appearing in bicipital groove. Subscapularis tendon is intact however small amount of hyperechoic calcific change at distal tendon insertion. Supraspinatus tendon is intact without obvious tear.  Small amount of hyperechoic calcific change at distal tendon insertion present. Infraspinatus tendon is intact without tear.  Again calcific change at distal tendon  insertion present. Increased  subacromial bursa thickness present. AC joint with moderate effusion. Impression: Chronic calcific tendinopathy.  Mild AC effusion.    Procedure: Real-time Ultrasound Guided Injection of right shoulder subacromial bursa Device: Philips Affiniti 50G Images permanently stored and available for review in the ultrasound unit. Verbal informed consent obtained.  Discussed risks and benefits of procedure. Warned about infection bleeding damage to structures skin hypopigmentation and fat atrophy among others. Patient expresses understanding and agreement Time-out conducted.   Noted no overlying erythema, induration, or other signs of local infection.   Skin prepped in a sterile fashion.   Local anesthesia: Topical Ethyl chloride.   With sterile technique and under real time ultrasound guidance:  40 mg of Kenalog and 3 mL of Marcaine injected easily.   Completed without difficulty   Pain immediately resolved suggesting accurate placement of the medication.   Advised to call if fevers/chills, erythema, induration, drainage, or persistent bleeding.   Images permanently stored and available for review in the ultrasound unit.  Impression: Technically successful ultrasound guided injection.        Impression and Recommendations:    Assessment and Plan: 54 y.o. female with right shoulder pain significant improvement with subacromial injection.  Plan to also proceed with physical therapy and Voltaren gel.  Work note written.  Patient unable to work currently anticipate return to work in 1 to 4 weeks.  Will recheck in 4 weeks prior to anticipated return to work date.  May return to work sooner if feeling better.  =   Orders Placed This Encounter  Procedures  . [place] LIMITED JOINT SPACE STRUCTURES UP RIGHT(NO LINKED CHARGES)    Order Specific Question:   Reason for Exam (SYMPTOM  OR DIAGNOSIS REQUIRED)    Answer:   R shoulder pain    Order Specific Question:   Preferred imaging location?     Answer:   [place]  . Ambulatory referral to Physical Therapy    Referral Priority:   Routine    Referral Type:   Physical Medicine    Referral Reason:   Specialty Services Required    Requested Specialty:   Physical Therapy   No orders of the defined types were placed in this encounter.   Discussed warning signs or symptoms. Please see discharge instructions. Patient expresses understanding.   The above documentation has been reviewed and is accurate and complete [name], M.D.

## 2019-07-07 NOTE — Patient Instructions (Addendum)
Thank you for coming in today. Try some voltaren gel over the counter on the shoulder.  Plan for PT.  For work make sure they send me forms Plan to recheck before 4 weeks to follow up shoulder pain.  Let me know if you are not doing well.  Marland Kitchen

## 2019-07-09 ENCOUNTER — Telehealth: Payer: Self-pay | Admitting: Family Medicine

## 2019-07-09 NOTE — Telephone Encounter (Signed)
Patient called stating that she is needing a more specific work note regarding when she can return to work. Her current note says that she may return in 1-4 weeks. She would like to wait the full 4 weeks if possible. She will pull the letter off of MyChart when it is ready.

## 2019-07-13 ENCOUNTER — Encounter: Payer: Self-pay | Admitting: Family Medicine

## 2019-07-13 NOTE — Telephone Encounter (Signed)
Note written and sent via mychart. Also a note was printed and is ready for pickup at the front desk.

## 2019-07-13 NOTE — Progress Notes (Signed)
Note written and sent via mychart. Also a note was printed and is ready for pickup at the front desk.

## 2019-07-16 ENCOUNTER — Ambulatory Visit: Payer: BC Managed Care – PPO | Attending: Family Medicine | Admitting: Physical Therapy

## 2019-07-16 DIAGNOSIS — M25511 Pain in right shoulder: Secondary | ICD-10-CM | POA: Insufficient documentation

## 2019-07-16 DIAGNOSIS — R293 Abnormal posture: Secondary | ICD-10-CM | POA: Insufficient documentation

## 2019-07-16 DIAGNOSIS — M6281 Muscle weakness (generalized): Secondary | ICD-10-CM | POA: Insufficient documentation

## 2019-07-16 DIAGNOSIS — M25611 Stiffness of right shoulder, not elsewhere classified: Secondary | ICD-10-CM | POA: Insufficient documentation

## 2019-07-29 ENCOUNTER — Ambulatory Visit: Payer: BC Managed Care – PPO

## 2019-07-29 ENCOUNTER — Other Ambulatory Visit: Payer: Self-pay

## 2019-07-29 DIAGNOSIS — M25511 Pain in right shoulder: Secondary | ICD-10-CM | POA: Diagnosis present

## 2019-07-29 DIAGNOSIS — M6281 Muscle weakness (generalized): Secondary | ICD-10-CM

## 2019-07-29 DIAGNOSIS — R293 Abnormal posture: Secondary | ICD-10-CM

## 2019-07-29 DIAGNOSIS — M25611 Stiffness of right shoulder, not elsewhere classified: Secondary | ICD-10-CM | POA: Diagnosis present

## 2019-07-29 NOTE — Therapy (Signed)
[facility] [address]  [place], [place], [address] Phone: [phone]   Fax:  [phone]  Physical Therapy Evaluation  Patient Details  Name: Brenda [name] MRN: [id number] Date of Birth: Oct 17, 1965 Referring Provider (PT): [name], MD   Encounter Date: 07/29/2019   PT End of Session - 07/29/19 1834    Visit Number 1    Number of Visits 8    Date for PT Re-Evaluation 08/26/19    Authorization Type BCBS    PT Start Time 1615    PT Stop Time 1705    PT Time Calculation (min) 50 min    Activity Tolerance Patient tolerated treatment well    Behavior During Therapy [facility] for tasks assessed/performed           Past Medical History:  Diagnosis Date  . Allergic rhinitis, cause unspecified   . Anemia, unspecified   . Diabetes mellitus without complication ([place])   . Esophageal reflux   . Generalized anxiety disorder   . Headache(784.0)   . Hypertension   . Pure hypercholesterolemia   . Rash and other nonspecific skin eruption   . Unspecified vitamin D deficiency     Past Surgical History:  Procedure Laterality Date  . TENDON REPAIR  1990   left index finger    There were no vitals filed for this visit.    Subjective Assessment - 07/29/19 1618    Subjective Pt reports insidious R shoulder pain of questionable onset. 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 She thought the pain would go away, but it did not even with Aleve. She was prescribed Tramadol and Prednisone, but she has not taken much of the latter due to her diabetes.    Limitations Lifting;Reading;House hold activities    Diagnostic tests [place] "limited joint space"    Patient Stated Goals I want to get better    Currently in Pain? Yes    Pain Score 4     Pain Location Shoulder    Pain Orientation Right    Pain Descriptors / Indicators  Burning;Aching;Other (Comment)   sometimes burning, piercing pain   Pain Type Acute pain    Pain Radiating Towards radiates to deltoid    Pain Onset More than a month ago    Pain Frequency Constant    Aggravating Factors  R S/L, repetitive, reaching OH, HBH for bra strap    Pain Relieving Factors Tramadol    Effect of Pain on Daily Activities Out of work currently              [facility] PT Assessment - 07/29/19 0001      Assessment   Medical Diagnosis R Shoulder Pain    Referring Provider (PT) [name], MD    Onset Date/Surgical Date 05/29/19    Hand Dominance Right    Next MD Visit August?    Prior Therapy R knee as a child      Balance Screen   Has the patient fallen in the past 6 months No    Has the patient had a decrease in activity level because of a fear of falling?  No    Is the patient reluctant to leave their home because of a fear of falling?  No      ROM / Strength   AROM / PROM / Strength AROM;PROM;Strength  AROM   AROM Assessment Site Shoulder    Right/Left Shoulder Right;Left    Right Shoulder Flexion 120 Degrees   P   Right Shoulder ABduction 102 Degrees   P   Right Shoulder Internal Rotation --   glute, pain   Right Shoulder External Rotation --   base of head, pain   Left Shoulder Flexion 150 Degrees    Left Shoulder ABduction 150 Degrees    Left Shoulder Internal Rotation --   T10   Left Shoulder External Rotation --   T2     PROM   PROM Assessment Site Shoulder    Right/Left Shoulder Right;Left    Right Shoulder Flexion 110 Degrees   pain   Right Shoulder ABduction 105 Degrees   pain   Right Shoulder Internal Rotation 50 Degrees    Right Shoulder External Rotation 85 Degrees    Left Shoulder Flexion 175 Degrees    Left Shoulder ABduction 155 Degrees    Left Shoulder Internal Rotation 55 Degrees    Left Shoulder External Rotation 82 Degrees      Palpation   Palpation comment TTP posterior cuff, deltoid, pecs; hypomobility of GHJ inferior >  posterior mobility                      Objective measurements completed on examination: See above findings.               PT Education - 07/29/19 1817    Education Details Prognosis, POC, HEP (see pt instructions    Person(s) Educated Patient    Methods Handout;Verbal cues;Tactile cues;Demonstration;Explanation   emailed handout   Comprehension Verbalized understanding            PT Short Term Goals - 07/29/19 1835      PT SHORT TERM GOAL #1   Title Pt will be I and compliant with initial HEP.    Time 1    Period Weeks    Status New    Target Date 08/05/19      PT SHORT TERM GOAL #2   Title Pt will increase R shoulder flexion to 120 degrees.    Time 3    Period Weeks    Status New    Target Date 08/19/19      PT SHORT TERM GOAL #3   Title Pt will increase R shoulder abduction to >115 degrees.    Time 3    Period Weeks    Status New    Target Date 08/19/19      PT SHORT TERM GOAL #4   Title Pt will be able to sleep through the night without waking up with shoulder pain.    Time 3    Period Weeks    Status New    Target Date 08/19/19             PT Long Term Goals - 07/29/19 1838      PT LONG TERM GOAL #1   Title Pt will be I and compliant with long term HEP.    Time 6    Period Weeks    Status New    Target Date 09/09/19      PT LONG TERM GOAL #2   Title Pt will increase shoulder ROM to [facility] with no pain.    Time 6    Period Weeks    Status New    Target Date 09/09/19      PT LONG TERM GOAL #3  Title Pt will be able to reach behind back to don/doff bra.    Baseline cannot perform    Time 6    Period Weeks    Status New    Target Date 09/09/19      PT LONG TERM GOAL #4   Title Pt will be able to return to work and perform job duties.    Baseline TDI    Time 6    Period Weeks    Status New    Target Date 09/09/19                  Plan - 07/29/19 1818    Clinical Impression Statement Pt is a 54 year old  female who presents with c/o R shoulder pain of insidious onset 2 months ago. Pt had an ultrasound and received a CSI. Pt is unsure of cause, but reports her job helping make gas pumps is repetitive labor, and she is right-handed, though she has been working this job for 6 years now. She is currrently not working, on temporary disability. Pt TTP to anterior, lateral, and posterior shoulder in pectorals, posterior cuff and deltoid, as well as with inferior > posterior glenohumeral joint mobilizations. Pt hypomobile with inferior glides and has subsequent limited glenohumeral abduction to 105. Painful end feels into flexion and abduction passively, but painful end feels present with all AROM or R shoulder. Pt has mild forward head, shoulder rounding and thoracic kyphosis, but mostly unremarkable. Symptoms appear to be consistent with adhesive capsulitis due to limited ROM and capsular restriction greatest into flexion currently, but actively pt is very limited into ER and IR as well. She was educated on potential diagnosis, prognosis, POC, and HEP with handout emailed. Pt verbalized understanding and consent to treatment. She would benefit from skilled PT 2x/week for 4 weeks to address impairments and maximize shoulder mobility.    Personal Factors and Comorbidities Age;Sex;Comorbidity 1;Comorbidity 2;Comorbidity 3+;Time since onset of injury/illness/exacerbation;Past/Current Experience;Profession    Comorbidities DM 2, HLD, HTN    Examination-Activity Limitations Sleep;Lift;Caring for Others;Reach Overhead;Dressing   reaching behind back to clasp bra   Examination-Participation Restrictions Laundry;Cleaning    Stability/Clinical Decision Making Stable/Uncomplicated    Clinical Decision Making Low    Rehab Potential Good    PT Frequency 2x / week    PT Duration 6 weeks    PT Treatment/Interventions Vasopneumatic Device;Spinal Manipulations;Joint Manipulations;Dry needling;Taping;Passive range of  motion;Manual techniques;Patient/family education;Therapeutic activities;Therapeutic exercise;Moist Heat;Iontophoresis 4mg /ml Dexamethasone;Electrical Stimulation;Cryotherapy;ADLs/Self Care Home Management    PT Next Visit Plan Shoulder FOTO; Assess response to HEP stretching, progress R shoulder mobility and ROM, manual therapy for mobility/ROM/soft tissue restrictions; assess shoulder strength    PT Home Exercise Plan [phone] (medbridge) pictures in pt instructions           Patient will benefit from skilled therapeutic intervention in order to improve the following deficits and impairments:  Hypomobility, Pain, Postural dysfunction, Impaired UE functional use, Impaired flexibility, Increased fascial restricitons, Decreased strength, Impaired perceived functional ability, Improper body mechanics, Decreased range of motion  Visit Diagnosis: Acute pain of right shoulder - Plan: PT plan of care cert/re-cert  Muscle weakness (generalized) - Plan: PT plan of care cert/re-cert  Abnormal posture - Plan: PT plan of care cert/re-cert     Problem List Patient Active Problem List   Diagnosis Date Noted  . Hepatic cyst 05/12/2018  . Routine general medical examination at a health care facility 07/16/2016  . Right knee pain 12/26/2015  . Type  2 diabetes mellitus with hyperglycemia, without long-term current use of insulin ([place]) 10/17/2015  . Depression 05/16/2014  . Essential hypertension, benign 03/08/2013  . DJD (degenerative joint disease) 06/16/2012  . HYPERCHOLESTEROLEMIA 08/18/2008  . ANEMIA, MILD 08/18/2008  . ANXIETY DISORDER, GENERALIZED 12/11/2006  . ALLERGIC RHINITIS 12/11/2006  . GERD 12/11/2006    Izell [place], PT, DPT 07/29/2019, 6:50 PM  [facility] [address]  [place], [place], [address] Phone: [phone]   Fax:  [phone]  Name: Brenda [name] MRN: [id number] Date of Birth: [birth year]

## 2019-08-03 ENCOUNTER — Telehealth: Payer: Self-pay | Admitting: Family Medicine

## 2019-08-03 ENCOUNTER — Other Ambulatory Visit: Payer: Self-pay

## 2019-08-03 ENCOUNTER — Encounter: Payer: Self-pay | Admitting: Family Medicine

## 2019-08-03 ENCOUNTER — Ambulatory Visit: Payer: BC Managed Care – PPO

## 2019-08-03 DIAGNOSIS — M25511 Pain in right shoulder: Secondary | ICD-10-CM

## 2019-08-03 DIAGNOSIS — R293 Abnormal posture: Secondary | ICD-10-CM

## 2019-08-03 DIAGNOSIS — M25611 Stiffness of right shoulder, not elsewhere classified: Secondary | ICD-10-CM

## 2019-08-03 DIAGNOSIS — M6281 Muscle weakness (generalized): Secondary | ICD-10-CM

## 2019-08-03 NOTE — Telephone Encounter (Signed)
Letter written for 3 weeks which would be July 14.  Letter sent through my chart and also printed and is available at the front desk.

## 2019-08-03 NOTE — Telephone Encounter (Signed)
Pt has had her 2nd PT visit, feeling better. Since she just started PT, she does not feel well enough to go back to work tomorrow.  Would Dr. [name] agree to writing her OOW for another 3 weeks so that she can complete more PT? If agreeable, please send via [place].

## 2019-08-03 NOTE — Therapy (Signed)
[facility] [address]  [place], [place], [address] Phone: [phone]   Fax:  [phone]  Physical Therapy Treatment  Patient Details  Name: Brenda [name] MRN: [id number] Date of Birth: December 01, 1965 Referring Provider (PT): [name], MD   Encounter Date: 08/03/2019   PT End of Session - 08/03/19 1108    Visit Number 2    Number of Visits 8    Date for PT Re-Evaluation 08/26/19    Authorization Type BCBS    PT Start Time 1106   pt arrived late   PT Stop Time 1155    PT Time Calculation (min) 49 min    Activity Tolerance Patient tolerated treatment well    Behavior During Therapy [facility] for tasks assessed/performed           Past Medical History:  Diagnosis Date  . Allergic rhinitis, cause unspecified   . Anemia, unspecified   . Diabetes mellitus without complication ([place])   . Esophageal reflux   . Generalized anxiety disorder   . Headache(784.0)   . Hypertension   . Pure hypercholesterolemia   . Rash and other nonspecific skin eruption   . Unspecified vitamin D deficiency     Past Surgical History:  Procedure Laterality Date  . TENDON REPAIR  1990   left index finger    There were no vitals filed for this visit.   Subjective Assessment - 08/03/19 1108    Subjective Pt reports "it's my rotator cuff, I couldn't remember last time" for what is causing the pain. She still feels a lot of pain and is supposed to go back to work tomorrow, but she doesn't think she can tolerate the physical labor.    Limitations Lifting;Reading;House hold activities    Diagnostic tests [place] "limited joint space"    Patient Stated Goals I want to get better    Currently in Pain? Yes    Pain Score 6     Pain Location Shoulder    Pain Orientation Right    Pain Descriptors / Indicators Other (Comment)   piercing   Pain Type Acute pain    Pain Radiating Towards up to shoulder    Pain Onset More than a month ago                [facility] PT Assessment - 08/03/19 0001      ROM / Strength   AROM / PROM / Strength AROM;PROM      AROM   AROM Assessment Site Shoulder    Right/Left Shoulder Right    Right Shoulder Flexion 131 Degrees   pain, 150 post MT   Right Shoulder ABduction 120 Degrees   pain, 140 post MT   Right Shoulder Internal Rotation --   L3 post manual therapy   Right Shoulder External Rotation --   T1     PROM   Right Shoulder Internal Rotation 67 Degrees    Right Shoulder External Rotation 85 Degrees                         [facility] Adult PT Treatment/Exercise - 08/03/19 0001      Exercises   Exercises Shoulder      Shoulder Exercises: Seated   Retraction AROM;Strengthening;Both;10 reps      Shoulder Exercises: Stretch   Internal Rotation Stretch 30 seconds    Internal Rotation Stretch Limitations posterior capsule S with HBB (pushing forward and pulling  shoulder back)     Wall Stretch - Flexion 2 reps;30 seconds    Wall Stretch - Flexion Limitations elbow bent inferior capsule S at wall     Other Shoulder Stretches 90/90 pec S 2 x 30"      Manual Therapy   Manual Therapy Joint mobilization;Soft tissue mobilization;Passive ROM    Manual therapy comments prone/supine    Joint Mobilization mid-upper T/S grade 3-4 mobs, post/inf GHJ glides grade 3    Soft tissue mobilization R UT/LS, posterior cuff, pecs    Passive ROM shoulder all planes                    PT Short Term Goals - 07/29/19 1835      PT SHORT TERM GOAL #1   Title Pt will be I and compliant with initial HEP.    Time 1    Period Weeks    Status New    Target Date 08/05/19      PT SHORT TERM GOAL #2   Title Pt will increase R shoulder flexion to 120 degrees.    Time 3    Period Weeks    Status New    Target Date 08/19/19      PT SHORT TERM GOAL #3   Title Pt will increase R shoulder abduction to >115 degrees.    Time 3    Period Weeks    Status New    Target Date 08/19/19      PT  SHORT TERM GOAL #4   Title Pt will be able to sleep through the night without waking up with shoulder pain.    Time 3    Period Weeks    Status New    Target Date 08/19/19             PT Long Term Goals - 07/29/19 1838      PT LONG TERM GOAL #1   Title Pt will be I and compliant with long term HEP.    Time 6    Period Weeks    Status New    Target Date 09/09/19      PT LONG TERM GOAL #2   Title Pt will increase shoulder ROM to [facility] with no pain.    Time 6    Period Weeks    Status New    Target Date 09/09/19      PT LONG TERM GOAL #3   Title Pt will be able to reach behind back to don/doff bra.    Baseline cannot perform    Time 6    Period Weeks    Status New    Target Date 09/09/19      PT LONG TERM GOAL #4   Title Pt will be able to return to work and perform job duties.    Baseline TDI    Time 6    Period Weeks    Status New    Target Date 09/09/19                 Plan - 08/03/19 1108    Clinical Impression Statement Pt presents with significant improvement in flexion/abduction A/PROM prior to manual therapy and even further increase in ROM as well as decrease in pain following manual therapy. Considerable stiffness to mid thoracic spine and R scapular, with T/S mobilized in prone and scap actively mobilized with supine abduction stretching. Pt reported pain relief and demonstrated greater ease of motion following session today.  She was able to reach behind back to L3 versus glute at IE, as well as overhead to T1 versus base of head at IE. She will continue to benefit from progressions of ROM and posture, as well as initiating strengthening.    Personal Factors and Comorbidities Age;Sex;Comorbidity 1;Comorbidity 2;Comorbidity 3+;Time since onset of injury/illness/exacerbation;Past/Current Experience;Profession    Comorbidities DM 2, HLD, HTN    Examination-Activity Limitations Sleep;Lift;Caring for Others;Reach Overhead;Dressing   reaching behind back to  clasp bra   Examination-Participation Restrictions Laundry;Cleaning    Stability/Clinical Decision Making Stable/Uncomplicated    Rehab Potential Good    PT Frequency 2x / week    PT Duration 6 weeks    PT Treatment/Interventions Vasopneumatic Device;Spinal Manipulations;Joint Manipulations;Dry needling;Taping;Passive range of motion;Manual techniques;Patient/family education;Therapeutic activities;Therapeutic exercise;Moist Heat;Iontophoresis 4mg /ml Dexamethasone;Electrical Stimulation;Cryotherapy;ADLs/Self Care Home Management    PT Next Visit Plan Shoulder FOTO; progress R shoulder mobility and ROM, manual therapy for mobility/ROM/soft tissue restrictions; initiate strengthening    PT Home Exercise Plan [phone] (medbridge) pictures in pt instructions           Patient will benefit from skilled therapeutic intervention in order to improve the following deficits and impairments:  Hypomobility, Pain, Postural dysfunction, Impaired UE functional use, Impaired flexibility, Increased fascial restricitons, Decreased strength, Impaired perceived functional ability, Improper body mechanics, Decreased range of motion  Visit Diagnosis: Acute pain of right shoulder  Muscle weakness (generalized)  Abnormal posture  Stiffness of right shoulder, not elsewhere classified     Problem List Patient Active Problem List   Diagnosis Date Noted  . Hepatic cyst 05/12/2018  . Routine general medical examination at a health care facility 07/16/2016  . Right knee pain 12/26/2015  . Type 2 diabetes mellitus with hyperglycemia, without long-term current use of insulin ([place]) 10/17/2015  . Depression 05/16/2014  . Essential hypertension, benign 03/08/2013  . DJD (degenerative joint disease) 06/16/2012  . HYPERCHOLESTEROLEMIA 08/18/2008  . ANEMIA, MILD 08/18/2008  . ANXIETY DISORDER, GENERALIZED 12/11/2006  . ALLERGIC RHINITIS 12/11/2006  . GERD 12/11/2006    Izell Sharp, PT, DPT 08/03/2019,  12:10 PM  [facility] [address]  [place], [place], [address] Phone: [phone]   Fax:  [phone]  Name: Brenda [name] MRN: [id number] Date of Birth: [birth year]

## 2019-08-09 ENCOUNTER — Ambulatory Visit: Payer: BC Managed Care – PPO | Admitting: Family Medicine

## 2019-08-09 NOTE — Progress Notes (Deleted)
   I, Wendy Poet, LAT, ATC, am serving as scribe for Dr. [name].  Brenda [name] is a 54 y.o. female who presents to [place] at [facility] today for f/u of R shoulder pain.  She was last seen by Dr. [name] on 07/07/19 and had a R subacromial injection and was referred to outpatient PT.  She has completed 2 PT visits.  Since her last visit, pt reports   Diagnostic imaging: R shoulder XR- 06/27/19  Pertinent review of systems: ***  Relevant historical information: ***   Exam:  LMP 11/26/2013  General: Well Developed, well nourished, and in no acute distress.   MSK: ***    Lab and Radiology Results No results found for this or any previous visit (from the past 72 hour(s)). No results found.     Assessment and Plan: 54 y.o. female with ***   PDMP not reviewed this encounter. No orders of the defined types were placed in this encounter.  No orders of the defined types were placed in this encounter.    Discussed warning signs or symptoms. Please see discharge instructions. Patient expresses understanding.   ***

## 2019-08-24 ENCOUNTER — Ambulatory Visit: Payer: BC Managed Care – PPO | Admitting: Physical Therapy

## 2019-08-30 ENCOUNTER — Ambulatory Visit: Payer: Self-pay

## 2019-08-30 ENCOUNTER — Other Ambulatory Visit: Payer: Self-pay

## 2019-08-30 ENCOUNTER — Ambulatory Visit (INDEPENDENT_AMBULATORY_CARE_PROVIDER_SITE_OTHER): Payer: BC Managed Care – PPO | Admitting: Family Medicine

## 2019-08-30 ENCOUNTER — Encounter: Payer: Self-pay | Admitting: Family Medicine

## 2019-08-30 VITALS — BP 130/90 | HR 103 | Ht 62.0 in | Wt 142.0 lb

## 2019-08-30 DIAGNOSIS — M25552 Pain in left hip: Secondary | ICD-10-CM | POA: Diagnosis not present

## 2019-08-30 DIAGNOSIS — M25511 Pain in right shoulder: Secondary | ICD-10-CM

## 2019-08-30 NOTE — Patient Instructions (Addendum)
Thank you for coming in today. Recheck with me in about 1 month (before 8/23).  Work on this in [place].  If you get better sooner and want to return to work sooner let me know and I will write a new letter.

## 2019-08-30 NOTE — Progress Notes (Signed)
   I, [name], LAT, ATC, am serving as scribe for Dr. [name].  Brenda [name] is a 54 y.o. female who presents to [place] at [facility] today for f/u of R shoulder pain and new hip pain.  She was last seen by Dr. [name] on 07/07/19 and had a R subacromial injection and was referred to PT.  She has completed 2 PT visits.  Since her last visit w/ Dr. [name], pt reports the shoulder is coming along. Still going to PT and has an appointment tomorrow. States she heard a loud in her left hip while taking a shoulder. Left lateral hip pain. States she has tightness in the hip. States that when she walks she feels pain. Hasn't tried anything for the pain only exercise.   Diagnostic testing: R shoulder XR- 06/27/19  Pertinent review of systems: No fevers or chills  Relevant historical information: Hypertension, diabetes   Exam:  BP 130/90 (BP Location: Left Arm, Patient Position: Sitting)   Pulse (!) 103   Ht 5\' 2"  (1.575 m)   Wt 142 lb (64.4 kg)   LMP 11/26/2013   SpO2 98%   BMI 25.97 kg/m  General: Well Developed, well nourished, and in no acute distress.   MSK: Right shoulder normal-appearing Normal motion Intact strength. Positive Hawkins and Neer's test. Negative Yergason's and speeds test.  Left hip normal-appearing Normal motion. Tender palpation greater trochanter. Hip abduction strength and external rotation strength 4/5 with pain      Assessment and Plan: 54 y.o. female with right shoulder pain improved a bit with very limited physical therapy.  Unfortunately patient is still having quite a bit of pain and dysfunction.  She is not quite ready to return to full duties at this time.  Plan for continued PT and reassessment in 1 month.  Work note provided today.  Left lateral hip pain: Hip abductor tendinopathy/trochanteric bursitis with episode of external snapping hip.  Again plan for physical therapy this already scheduled.  Out of work for 1  month as above.   PDMP not reviewed this encounter. Orders Placed This Encounter  Procedures  . [place] LIMITED JOINT SPACE STRUCTURES LOW LEFT(NO LINKED CHARGES)    Order Specific Question:   Reason for Exam (SYMPTOM  OR DIAGNOSIS REQUIRED)    Answer:   L hip pain    Order Specific Question:   Preferred imaging location?    Answer:   [facility]  . Ambulatory referral to Physical Therapy    Referral Priority:   Routine    Referral Type:   Physical Medicine    Referral Reason:   Specialty Services Required    Requested Specialty:   Physical Therapy   No orders of the defined types were placed in this encounter.    Discussed warning signs or symptoms. Please see discharge instructions. Patient expresses understanding.   The above documentation has been reviewed and is accurate and complete [name], M.D.

## 2019-08-31 ENCOUNTER — Ambulatory Visit: Payer: BC Managed Care – PPO | Attending: Family Medicine

## 2019-09-15 ENCOUNTER — Ambulatory Visit: Payer: BC Managed Care – PPO | Admitting: Physical Therapy

## 2019-09-16 ENCOUNTER — Ambulatory Visit: Payer: BC Managed Care – PPO | Admitting: Physical Therapy

## 2019-09-23 ENCOUNTER — Other Ambulatory Visit: Payer: Self-pay | Admitting: Nurse Practitioner

## 2019-09-23 DIAGNOSIS — K219 Gastro-esophageal reflux disease without esophagitis: Secondary | ICD-10-CM

## 2019-09-23 DIAGNOSIS — E1165 Type 2 diabetes mellitus with hyperglycemia: Secondary | ICD-10-CM

## 2019-09-23 DIAGNOSIS — E7800 Pure hypercholesterolemia, unspecified: Secondary | ICD-10-CM

## 2019-09-23 DIAGNOSIS — I10 Essential (primary) hypertension: Secondary | ICD-10-CM

## 2019-09-27 ENCOUNTER — Ambulatory Visit: Payer: BC Managed Care – PPO | Attending: Family Medicine | Admitting: Physical Therapy

## 2019-09-27 ENCOUNTER — Telehealth: Payer: Self-pay | Admitting: Family Medicine

## 2019-09-27 ENCOUNTER — Other Ambulatory Visit: Payer: Self-pay

## 2019-09-27 ENCOUNTER — Encounter: Payer: Self-pay | Admitting: Physical Therapy

## 2019-09-27 DIAGNOSIS — R262 Difficulty in walking, not elsewhere classified: Secondary | ICD-10-CM | POA: Diagnosis present

## 2019-09-27 DIAGNOSIS — M25511 Pain in right shoulder: Secondary | ICD-10-CM | POA: Diagnosis present

## 2019-09-27 DIAGNOSIS — M6281 Muscle weakness (generalized): Secondary | ICD-10-CM | POA: Insufficient documentation

## 2019-09-27 DIAGNOSIS — M25552 Pain in left hip: Secondary | ICD-10-CM | POA: Insufficient documentation

## 2019-09-27 NOTE — Telephone Encounter (Signed)
Per pt, her manager has advised that she cannot return to work with any restrictions. She is due to RTW on 8/23, but still has 6 weeks of PT and does not feel ready for full duty. Can we extend OOW note?

## 2019-09-27 NOTE — Therapy (Addendum)
[facility] [address]  [place], [place], [address] Phone: [phone]   Fax:  [phone]  Physical Therapy Evaluation  Patient Details  Name: Brenda [name] MRN: [id number] Date of Birth: Jan 17, 1966 Referring Provider (PT): [name], MD   Encounter Date: 09/27/2019   PT End of Session - 09/27/19 1544    Visit Number 1    Number of Visits 13    Date for PT Re-Evaluation 11/08/19    Authorization Type BCBS    PT Start Time 1455   pt late   PT Stop Time 1534    PT Time Calculation (min) 39 min    Activity Tolerance Patient tolerated treatment well;Patient limited by pain    Behavior During Therapy [facility] for tasks assessed/performed           Past Medical History:  Diagnosis Date  . Allergic rhinitis, cause unspecified   . Anemia, unspecified   . Diabetes mellitus without complication ([place])   . Esophageal reflux   . Generalized anxiety disorder   . Headache(784.0)   . Hypertension   . Pure hypercholesterolemia   . Rash and other nonspecific skin eruption   . Unspecified vitamin D deficiency     Past Surgical History:  Procedure Laterality Date  . TENDON REPAIR  1990   left index finger    There were no vitals filed for this visit.    Subjective Assessment - 09/27/19 1457    Subjective Patient reporting L hip pain since mid July when she turned and felt a pop in her L hip while in the shower. Tried to perform some exercises for it on her own which didn't seem to help. Pain occurs over the L lateral hip. Reports tingling and burning over the lateral hip with intermittent radiation down the L leg to the middle toe which has been there for the past 2 weeks. Denies LBP or B&B changes. BG today was 102. Pain worse with prolonged sitting, standing, and turning. R shoulder pain was improved with exercises given to her during previous POC, but flared up in the past 2 weeks d/t moving. Pain is located over the  superior lateral shoulder. Worse with horizontal abduction and repetitive movement- which she is required to do at work.    Pertinent History HLD, HTN, HA, GAD, reflux, DM, anemia    Limitations Sitting;Lifting;Standing;Walking;House hold activities    How long can you sit comfortably? 2 hours    How long can you stand comfortably? 1 hour-1.5 hours    How long can you walk comfortably? 1 hour-1.5 hours    Diagnostic tests 07/07/19 R shoulder [facility] [place]: Chronic calcific tendinopathy.  Mild AC effusion.    Patient Stated Goals get rid of pain    Currently in Pain? Yes    Pain Score 6     Pain Location Shoulder    Pain Orientation Right    Pain Descriptors / Indicators Aching    Pain Type Acute pain    Multiple Pain Sites Yes    Pain Score 5    Pain Location Hip    Pain Orientation Left;Lateral    Pain Descriptors / Indicators Aching    Pain Type Acute pain              [facility] PT Assessment - 09/27/19 1506      Assessment   Medical Diagnosis L hip pain, acute pain of R shoulder    Referring Provider (PT)  [name], MD    Onset Date/Surgical Date 09/06/19    Hand Dominance Right    Prior Therapy R knee as a child      Precautions   Precautions None      Balance Screen   Has the patient fallen in the past 6 months No    Has the patient had a decrease in activity level because of a fear of falling?  No    Is the patient reluctant to leave their home because of a fear of falling?  No      Home Ecologist residence    Living Arrangements Parent   mother   Available Help at Discharge Family    Type of [place] to enter    Entrance Stairs-Number of Steps 2    Entrance Stairs-Rails Right    [place] One level    [place] None      Prior Function   Level of Independence Independent    Vocation Full time employment    Vocation Requirements standing, walking, lifting, using a drill    Leisure gym      Cognition     Overall Cognitive Status Within Functional Limits for tasks assessed      Sensation   Light Touch Appears Intact   L 3rd digit N/T     Coordination   Gross Motor Movements are Fluid and Coordinated Yes      AROM   AROM Assessment Site Hip;Lumbar    Right/Left Hip Right;Left    Right Hip Flexion 102    Right Hip External Rotation  48    Right Hip Internal Rotation  42    Left Hip Flexion 75   moderate pain over L lateral hip   Left Hip External Rotation  43   moderate lateral hip pain   Left Hip Internal Rotation  41   moderate pain over L lateral hip   Lumbar Flexion distal shin   moderate pain over L lateral hip   Lumbar Extension mildly limited    moderate pain over L lateral hip   Lumbar - Right Side Bend jt line   moderate pain over L lateral hip   Lumbar - Left Side Bend distal thigh   moderate pain over L lateral hip   Lumbar - Right Rotation mildly limited   moderate pain over L lateral hip   Lumbar - Left Rotation WFL   moderate pain over L lateral hip     Strength   Strength Assessment Site Hip;Knee;Ankle    Right/Left Hip Right;Left    Right Hip Flexion 4+/5    Right Hip ABduction 4/5    Right Hip ADduction 4/5    Left Hip Flexion 4+/5    Left Hip ABduction 4+/5    Left Hip ADduction 4/5    Right/Left Knee Right;Left    Right Knee Flexion 4/5    Right Knee Extension 4+/5    Left Knee Flexion 4/5    Left Knee Extension 5/5    Right/Left Ankle Right;Left    Right Ankle Dorsiflexion 4+/5    Right Ankle Plantar Flexion 4+/5    Left Ankle Dorsiflexion 4-/5    Left Ankle Plantar Flexion 4/5      Flexibility   Soft Tissue Assessment /Muscle Length yes    Hamstrings B WFL, hip pain on L    ITB positive L ober's test and pain  Piriformis B KTOS and fig 4 WFL, pain on L      Palpation   Palpation comment TTP over L TFL and glute med, TTP and large trigger point over L hip flexor/TFL      Ambulation/Gait   Gait Pattern Step-through pattern;Decreased weight  shift to left;Lateral hip instability    Ambulation Surface Level;Indoor    Gait velocity WFL                      Objective measurements completed on examination: See above findings.               PT Education - 09/27/19 1543    Education Details prognosis, POC, HEP    Person(s) Educated Patient    Methods Explanation;Demonstration;Tactile cues;Verbal cues;Handout    Comprehension Verbalized understanding;Returned demonstration            PT Short Term Goals - 09/27/19 1552      PT SHORT TERM GOAL #1   Title Pt will be I and compliant with initial HEP.    Time 3    Period Weeks    Status New    Target Date 10/18/19             PT Long Term Goals - 09/27/19 1553      PT LONG TERM GOAL #1   Title Pt will be I and compliant with long term HEP.    Time 6    Period Weeks    Status New    Target Date 11/08/19      PT LONG TERM GOAL #2   Title Pt will increase shoulder ROM to [facility] with no pain.    Time 6    Period Weeks    Status New    Target Date 11/08/19      PT LONG TERM GOAL #3   Title Patient to demonstrate L hip AROM WFL and without pain limiting.    Time 6    Period Weeks    Status New    Target Date 11/08/19      PT LONG TERM GOAL #4   Title Pt will be able to return to work and perform job duties.    Time 6    Period Weeks    Status New    Target Date 11/08/19      PT LONG TERM GOAL #5   Title Patient to demonstrate B LE strength >/=4+/5.    Time 6    Period Weeks    Status New    Target Date 11/08/19                  Plan - 09/27/19 1545    Clinical Impression Statement Patient is a 54y/o F presenting to OPPT with c/o acute on chronic R shoulder and acute L hip pain of 2 weeks duration. R shoulder pain occurs over the R superior lateral shoulder and worse with horizontal abduction and repetitive movement which she is required to perform at work. L hip pain occurs over the lateral aspect with intermittent N/T  down the L LE to the 3rd digit.  Denies LBP or B&B changes. Worse with prolonged sitting, standing, and turning. Patient today presenting with painful and limited lumbar and L hip AROM, decreased LE strength, pain and tightness with LE stretching, positive L Ober's test, TTP over L TFL and glute med with large trigger point over L hip flexor/TFL, and gait deviations. Patient was educated on  gentle stretching and strengthening HEP and advised not to push into pain. Patient reported understanding. Plan to assess R shoulder pain next session. Patient would benefit from skilled PT services 2x/week for 6 weeks to address aforementioned impairments.    Personal Factors and Comorbidities Age;Sex;Comorbidity 3+;Time since onset of injury/illness/exacerbation;Past/Current Experience;Profession;Comorbidity 2;Comorbidity 1    Comorbidities DM 2, HLD, HTN    Examination-Activity Limitations Sleep;Lift;Caring for Others;Reach Overhead;Dressing;Sit;Bend;Squat;Stairs;Stand;Transfers;Hygiene/Grooming;Locomotion Level    Examination-Participation Restrictions Laundry;Cleaning;Church;Community Activity;Shop;Driving;Meal Prep;Occupation    Stability/Clinical Decision Making Stable/Uncomplicated    Clinical Decision Making Low    Rehab Potential Good    PT Frequency 2x / week    PT Duration 6 weeks    PT Treatment/Interventions Vasopneumatic Device;Spinal Manipulations;Joint Manipulations;Dry needling;Taping;Passive range of motion;Manual techniques;Patient/family education;Therapeutic activities;Therapeutic exercise;Moist Heat;Iontophoresis 63m/ml Dexamethasone;Electrical Stimulation;Cryotherapy;ADLs/Self Care Home Management;Traction;Balance training;Functional mobility training;Stair training;Gait training;Ultrasound;Neuromuscular re-education;Energy conservation    PT Next Visit Plan assess R shoulder ROM and strength, reasses HEP    Consulted and Agree with Plan of Care Patient           Patient will benefit from  skilled therapeutic intervention in order to improve the following deficits and impairments:  Hypomobility, Pain, Postural dysfunction, Impaired UE functional use, Impaired flexibility, Increased fascial restricitons, Decreased strength, Impaired perceived functional ability, Improper body mechanics, Decreased range of motion, Decreased activity tolerance, Difficulty walking, Increased muscle spasms  Visit Diagnosis: Pain in left hip  Muscle weakness (generalized)  Acute pain of right shoulder  Difficulty in walking, not elsewhere classified     Problem List Patient Active Problem List   Diagnosis Date Noted  . Hepatic cyst 05/12/2018  . Routine general medical examination at a health care facility 07/16/2016  . Right knee pain 12/26/2015  . Type 2 diabetes mellitus with hyperglycemia, without long-term current use of insulin ([place] 10/17/2015  . Depression 05/16/2014  . Essential hypertension, benign 03/08/2013  . DJD (degenerative joint disease) 06/16/2012  . HYPERCHOLESTEROLEMIA 08/18/2008  . ANEMIA, MILD 08/18/2008  . ANXIETY DISORDER, GENERALIZED 12/11/2006  . ALLERGIC RHINITIS 12/11/2006  . GERD 12/11/2006     YJanene Harvey PT, DPT 09/27/19    [facility] [address] [place] [address]Phone: [phone]  Fax:  [phone] Name: Brenda TREPTOWMRN: [id number] of Birth: 6January 21, 1967 PHYSICAL THERAPY DISCHARGE SUMMARY  Visits from Start of Care: 1  Current functional level related to goals / functional outcomes: See above clinical impression; patient did not return   Remaining deficits: See above   Education / Equipment: HEP  Plan: Patient agrees to discharge.  Patient goals were not met. Patient is being discharged due to not returning since the last visit.  ?????     YJanene Harvey PT, DPT 10/28/19 1:31 PM

## 2019-09-27 NOTE — Telephone Encounter (Signed)
Pt called requesting something for pain. Her shoulder and hip are constantly throbbing. Has tried Tramadol in the past, does not feel like this was enough.

## 2019-09-28 ENCOUNTER — Encounter: Payer: Self-pay | Admitting: Family Medicine

## 2019-09-28 MED ORDER — HYDROCODONE-ACETAMINOPHEN 5-325 MG PO TABS: 1.0000 | ORAL_TABLET | Freq: Four times a day (QID) | ORAL | 0 refills | Status: DC | PRN

## 2019-09-28 NOTE — Telephone Encounter (Signed)
Called pt and informed her that updated work note is available through [employer] and that Dr. [name] prescribed hydrocodone.  Pt verbalizes understanding.

## 2019-09-28 NOTE — Telephone Encounter (Signed)
Letter written.  Letter sent electronically through my chart and a copy was printed.  Out of work until September 27.

## 2019-09-28 NOTE — Telephone Encounter (Signed)
Hydrocodone prescribed 

## 2019-09-30 ENCOUNTER — Ambulatory Visit: Payer: BC Managed Care – PPO

## 2019-10-05 ENCOUNTER — Ambulatory Visit: Payer: BC Managed Care – PPO

## 2019-10-07 ENCOUNTER — Ambulatory Visit: Payer: BC Managed Care – PPO

## 2019-10-12 ENCOUNTER — Ambulatory Visit: Payer: BC Managed Care – PPO

## 2019-10-14 ENCOUNTER — Ambulatory Visit: Payer: BC Managed Care – PPO | Admitting: Physical Therapy

## 2019-10-19 ENCOUNTER — Ambulatory Visit: Payer: BC Managed Care – PPO | Attending: Family Medicine

## 2019-10-21 ENCOUNTER — Encounter: Payer: BC Managed Care – PPO | Admitting: Physical Therapy

## 2019-11-02 ENCOUNTER — Ambulatory Visit (INDEPENDENT_AMBULATORY_CARE_PROVIDER_SITE_OTHER): Payer: BC Managed Care – PPO | Admitting: Family Medicine

## 2019-11-02 DIAGNOSIS — Z5329 Procedure and treatment not carried out because of patient's decision for other reasons: Secondary | ICD-10-CM

## 2019-11-02 NOTE — Progress Notes (Signed)
NO SHOW

## 2019-11-10 ENCOUNTER — Other Ambulatory Visit: Payer: Self-pay

## 2019-11-10 ENCOUNTER — Ambulatory Visit (INDEPENDENT_AMBULATORY_CARE_PROVIDER_SITE_OTHER): Payer: BC Managed Care – PPO | Admitting: Family Medicine

## 2019-11-10 ENCOUNTER — Encounter: Payer: Self-pay | Admitting: Family Medicine

## 2019-11-10 ENCOUNTER — Ambulatory Visit: Payer: Self-pay

## 2019-11-10 VITALS — BP 140/88 | HR 88 | Ht 62.0 in | Wt 141.6 lb

## 2019-11-10 DIAGNOSIS — M25511 Pain in right shoulder: Secondary | ICD-10-CM | POA: Diagnosis not present

## 2019-11-10 DIAGNOSIS — M25552 Pain in left hip: Secondary | ICD-10-CM | POA: Diagnosis not present

## 2019-11-10 NOTE — Patient Instructions (Signed)
Thank you for coming in today.  Plan to continue PT.  Let me know if you need to to modify you letter.  Call or go to the ER if you develop a large red swollen joint with extreme pain or oozing puss.   Recheck 6 weeks as needed.

## 2019-11-10 NOTE — Progress Notes (Signed)
I, Wendy Poet, LAT, ATC, am serving as scribe for Dr. [name].  Brenda [name] is a 54 y.o. female who presents to [place] [facility] today for f/u of L hip pain.  She was last seen by Dr. [name] on 08/30/19 for her L hip and R shoulder.  She has completed 1 PT visit for her hip and 2 for her shoulder.  Since her last visit, pt reports that her L hip is doing better but con't to have pain.  She con't to have throbbing pain in her L lateral hip, particularly when she walks a lot and when she does prolonged standing.  She states that she's had a lot of family issues over the past couple months and had to hold her PT.  She is now living w/ her mother and needs a new PT location.  New order placed for PT at [facility].  She works at [employer] and helps [employer].  She states that she will need some type of letter/documentation stating what she can/can't do from a work standpoint.  She would like to be able to sit occasionally at work and not left weight above the level of her shoulder.  She also would like to not have to work more than 8 hours a day.  She thinks with these restrictions she should be able to work.  Pertinent review of systems: No fevers or chills  Relevant historical information: Hypertension   Exam:  BP 140/88 (BP Location: Right Arm, Patient Position: Sitting, Cuff Size: Normal)   Pulse 88   Ht 5\' 2"  (1.575 m)   Wt 141 lb 9.6 oz (64.2 kg)   LMP 11/26/2013   SpO2 98%   BMI 25.90 kg/m  General: Well Developed, well nourished, and in no acute distress.   MSK: Right shoulder normal-appearing normal motion normal strength.  Left hip normal-appearing tender palpation greater trochanter normal motion.  Hip abduction strength diminished 4+/5.    Lab and Radiology Results  Hip greater trochanteric injection: left Consent obtained and timeout performed. Area of maximum tenderness palpated and identified. Skin cleaned with  alcohol, cold spray applied. A 22g spinal needle was used to access the greater trochanteric bursa. 40 mg of Kenalog and 2 mL of Marcaine were used to inject the trochanteric bursa. Patient tolerated the procedure well.     Assessment and Plan: 54 y.o. female with left lateral hip pain due to trochanteric bursitis.  Resuming physical therapy which should be very helpful.  Plan to proceed with injection as well.  Also work restrictions will hopefully allow return to work.  Shoulder pain still having a bit of discomfort and pain which physical therapy should be able to continue to improve.  Again work restrictions without heavy lifting should be helpful.  Letter written for work restrictions obviously will be happy to fill out forms if needed in the future.  Recheck 6 weeks as needed.   PDMP not reviewed this encounter. Orders Placed This Encounter  Procedures  . Ambulatory referral to Physical Therapy    Referral Priority:   Routine    Referral Type:   Physical Medicine    Referral Reason:   Specialty Services Required    Requested Specialty:   Physical Therapy    Number of Visits Requested:   1   No orders of the defined types were placed in this encounter.    Discussed warning signs or symptoms. Please see discharge instructions. Patient expresses  understanding.   The above documentation has been reviewed and is accurate and complete [name], M.D.

## 2019-11-16 ENCOUNTER — Telehealth: Payer: Self-pay | Admitting: Family Medicine

## 2019-11-16 NOTE — Telephone Encounter (Signed)
Patient called stating that she has been having some extreme low back pain that seems to be getting worse. She wanted to know if there was anything Dr [name] could recommend? She has not been able to do any therapy or go back to work because of it.  She said that she was here last week and had a cortisone injection for her hip.   Please advise.

## 2019-11-17 ENCOUNTER — Telehealth: Payer: Self-pay | Admitting: Family Medicine

## 2019-11-17 MED ORDER — TIZANIDINE HCL 4 MG PO TABS
4.0000 mg | ORAL_TABLET | Freq: Four times a day (QID) | ORAL | 1 refills | Status: DC | PRN
Start: 2019-11-17 — End: 2021-08-06

## 2019-11-17 NOTE — Telephone Encounter (Signed)
Pt called back and said she was able to get this [facility] [place]? Advised her to call me if any issues.

## 2019-11-17 NOTE — Telephone Encounter (Signed)
I have prescribed tizanidine which is a muscle relaxer that may help.  Physical therapy should be very important for this so it is important to go if possible.  Please let me know if you are still struggling.  [facility].

## 2019-11-17 NOTE — Telephone Encounter (Signed)
Pt went to pick up her Tizanidine today at [facility] and expected her pain meds, although I do not see this requested.  Can we [facility] [address].

## 2019-11-17 NOTE — Telephone Encounter (Signed)
Called pt to relay Dr. Clovis Riley message.  Advised her that [facility] [address]  Got disconnected.  Pt needs phone number for Roma Kayser PT: [phone].  Please provide if she calls back.

## 2019-11-19 ENCOUNTER — Telehealth: Payer: Self-pay | Admitting: Family Medicine

## 2019-11-19 ENCOUNTER — Encounter: Payer: Self-pay | Admitting: Family Medicine

## 2019-11-19 NOTE — Telephone Encounter (Signed)
Patient called stating that the HR director, Cordelia Pen, contacted her stating that she will need to be written out of work for another 1-2 weeks until she is able to see the doctor at [facility]. They have to see them before she is able to return to work.  Please advise.  (If a letter can be written, she asked if it would be able to be uploaed to [place])

## 2019-11-19 NOTE — Telephone Encounter (Signed)
Called patient and informed

## 2019-11-19 NOTE — Telephone Encounter (Signed)
Letter written and set to [facility]

## 2019-11-29 ENCOUNTER — Ambulatory Visit: Payer: BC Managed Care – PPO | Admitting: Rehabilitative and Restorative Service Providers"

## 2019-12-06 ENCOUNTER — Encounter: Payer: BC Managed Care – PPO | Admitting: Nurse Practitioner

## 2019-12-10 ENCOUNTER — Telehealth: Payer: Self-pay | Admitting: Family Medicine

## 2019-12-10 NOTE — Telephone Encounter (Signed)
Lets reassess this during visit scheduled November 8.

## 2019-12-10 NOTE — Telephone Encounter (Signed)
Pt is without short term currently as we released her but are now going to reassess at her next appt. She needs a letter stating she is still OOW until next assessment so she can continue her benefits.

## 2019-12-10 NOTE — Telephone Encounter (Signed)
Pt called, both she and her employer are uncomfortable with removing her limitations and returning to full duty. Especially since she is still attending PT.  Wonders if she could continue working with the same limitations until the beginning of the year.  If so, OK to send update via letter.

## 2019-12-10 NOTE — Telephone Encounter (Signed)
Spoke with patient and she is agreement to reassess at her appointment on 11/8. Brenda [name]

## 2019-12-13 ENCOUNTER — Encounter: Payer: Self-pay | Admitting: Family Medicine

## 2019-12-13 NOTE — Telephone Encounter (Signed)
Letter written. Ready for pickup

## 2019-12-13 NOTE — Telephone Encounter (Signed)
Letter emailed to patient, per her request.

## 2019-12-15 ENCOUNTER — Encounter: Payer: Self-pay | Admitting: Rehabilitative and Restorative Service Providers"

## 2019-12-15 ENCOUNTER — Other Ambulatory Visit: Payer: Self-pay

## 2019-12-15 ENCOUNTER — Ambulatory Visit (INDEPENDENT_AMBULATORY_CARE_PROVIDER_SITE_OTHER): Payer: BC Managed Care – PPO | Admitting: Rehabilitative and Restorative Service Providers"

## 2019-12-15 DIAGNOSIS — M6281 Muscle weakness (generalized): Secondary | ICD-10-CM | POA: Diagnosis not present

## 2019-12-15 DIAGNOSIS — M25652 Stiffness of left hip, not elsewhere classified: Secondary | ICD-10-CM | POA: Diagnosis not present

## 2019-12-15 DIAGNOSIS — G8929 Other chronic pain: Secondary | ICD-10-CM

## 2019-12-15 DIAGNOSIS — M25611 Stiffness of right shoulder, not elsewhere classified: Secondary | ICD-10-CM

## 2019-12-15 DIAGNOSIS — R293 Abnormal posture: Secondary | ICD-10-CM | POA: Diagnosis not present

## 2019-12-15 DIAGNOSIS — M25552 Pain in left hip: Secondary | ICD-10-CM

## 2019-12-15 DIAGNOSIS — M25511 Pain in right shoulder: Secondary | ICD-10-CM

## 2019-12-15 NOTE — Patient Instructions (Signed)
Access Code: [email or web address] URL: [email or web address] Date: 12/15/2019 Prepared by: Vista Mink  Exercises Supine Shoulder Internal Rotation Stretch - 2-3 x daily - 7 x weekly - 1 sets - 10-20 reps - 10 secondsinutes hold Standing Hip Hiking - 2 x daily - 7 x weekly - 2 sets - 10 reps - 3 seconds hold Single Knee to Chest Stretch - 2-3 x daily - 7 x weekly - 1 sets - 5 reps - 20 seconds hold Supine Figure 4 Piriformis Stretch - 2-3 x daily - 7 x weekly - 1 sets - 5 reps - 20 seconds hold

## 2019-12-15 NOTE — Therapy (Signed)
[facility] [address] [place], [place], [address] Phone: [phone]   Fax:  [phone]  Physical Therapy Evaluation  Patient Details  Name: Brenda [name] MRN: [id number] Date of Birth: 06-09-1965 Referring Provider (PT): Gregor Hams MD   Encounter Date: 12/15/2019   PT End of Session - 12/15/19 1712    Visit Number 1    Number of Visits 16    PT Start Time 3810    PT Stop Time 1512    PT Time Calculation (min) 56 min    Activity Tolerance Patient tolerated treatment well;No increased pain    Behavior During Therapy WFL for tasks assessed/performed           Past Medical History:  Diagnosis Date  . Allergic rhinitis, cause unspecified   . Anemia, unspecified   . Diabetes mellitus without complication ([place])   . Esophageal reflux   . Generalized anxiety disorder   . Headache(784.0)   . Hypertension   . Pure hypercholesterolemia   . Rash and other nonspecific skin eruption   . Unspecified vitamin D deficiency     Past Surgical History:  Procedure Laterality Date  . TENDON REPAIR  1990   left index finger    There were no vitals filed for this visit.    Subjective Assessment - 12/15/19 1709    Subjective Brenda [name] notes chronic L hip and R shoulder pain.  Some sciatic symptoms are also present so we addressed this today.  Prolonged sitting is most limited and the shoulder bothers her at work.  Although the shoulder is improving, it still limits function.  The L hip is the biggest concern.    Limitations Sitting;House hold activities;Lifting    How long can you sit comfortably? < 1 hour    How long can you stand comfortably? < 1 hour    How long can you walk comfortably? Not as limited    Patient Stated Goals Be able to do normal ADLs without being limited by pain.    Currently in Pain? Yes    Pain Score 5     Pain Location Hip    Pain Orientation Left    Pain Descriptors / Indicators Aching;Sore    Pain Type Chronic  pain    Pain Radiating Towards To L foot    Pain Onset More than a month ago    Pain Frequency Intermittent    Aggravating Factors  Sitting and prolonged standing    Pain Relieving Factors Change of position    Effect of Pain on Daily Activities Limits endurance with daily activities              [facility] PT Assessment - 12/15/19 0001      Assessment   Medical Diagnosis L hip and R shoulder pain    Referring Provider (PT) Gregor Hams MD    Onset Date/Surgical Date --   May for shoulder/July for hip   Next MD Visit 12/20/2019      Balance Screen   Has the patient fallen in the past 6 months No    Has the patient had a decrease in activity level because of a fear of falling?  No    Is the patient reluctant to leave their home because of a fear of falling?  No      Prior Function   Level of Independence Independent      Cognition   Overall Cognitive Status Within Functional Limits for tasks assessed  Observation/Other Assessments   Focus on Therapeutic Outcomes (FOTO)  57 (68 Goal)      ROM / Strength   AROM / PROM / Strength AROM;Strength      AROM   Overall AROM  Deficits    AROM Assessment Site Lumbar;Hip;Shoulder    Right/Left Shoulder Left;Right    Right Shoulder Flexion 130 Degrees    Right Shoulder Internal Rotation 55 Degrees    Right Shoulder External Rotation 110 Degrees    Right Shoulder Horizontal  ADduction 40 Degrees    Left Shoulder Flexion 165 Degrees    Left Shoulder Internal Rotation 65 Degrees    Left Shoulder External Rotation 100 Degrees    Left Shoulder Horizontal ADduction 45 Degrees    Right/Left Hip Left;Right    Right Hip Flexion 100    Right Hip External Rotation  37    Right Hip Internal Rotation  12    Left Hip Flexion 85    Left Hip External Rotation  26    Left Hip Internal Rotation  7    Lumbar Extension 5      Strength   Overall Strength Deficits    Strength Assessment Site Hip;Shoulder    Right/Left Shoulder Left;Right     Right Shoulder Internal Rotation 5/5    Right Shoulder External Rotation 4+/5    Left Shoulder External Rotation 5/5    Left Shoulder Horizontal ABduction 5/5    Right/Left Hip Left;Right    Right Hip Flexion 5/5    Right Hip ABduction 5/5    Left Hip Flexion 3-/5    Left Hip ABduction 3-/5      Flexibility   Soft Tissue Assessment /Muscle Length yes    Hamstrings 45/45 degrees                      Objective measurements completed on examination: See above findings.       [facility] Adult PT Treatment/Exercise - 12/15/19 0001      Exercises   Exercises Shoulder;Knee/Hip      Knee/Hip Exercises: Stretches   Hip Flexor Stretch 4 reps;20 seconds;Both   Single knee to chest   Piriformis Stretch 4 reps;20 seconds;Both   1 knee bent and opposite ankle crossed over push with hand     Knee/Hip Exercises: Standing   Other Standing Knee Exercises Hip hike using table for support 10X 3 seconds      Shoulder Exercises: Supine   Internal Rotation Left;10 reps   10 seconds                      PT Long Term Goals - 12/15/19 1718      PT LONG TERM GOAL #1   Title Improve FOTO score to 68.    Baseline 57    Time 8    Period Weeks    Status New    Target Date 02/11/20      PT LONG TERM GOAL #2   Title Improve L hip pain to 0-3/10 with no reports of sciatica or R shoulder pain.    Baseline 5-6/10 and sciatica    Time 8    Period Weeks    Status New    Target Date 02/11/20      PT LONG TERM GOAL #3   Title Improve L hip strength as assessed by MMT scores and functional self-report.    Baseline See note.    Time 8    Period  Weeks    Status New    Target Date 02/11/20      PT LONG TERM GOAL #4   Title Brenda [name] will be independent with her HEP at DC.    Time 8    Period Weeks    Status New    Target Date 02/11/20                  Plan - 12/15/19 1539    Clinical Impression Statement Brenda [name] has a chronic R shoulder impingement that should  do well with gentle posterior capsule stretching as strength appears good.  Some scapular and RTC strengthening may be needed to resolve chronic symptoms.  Her L hip is weak and symptoms may also be related to her flexed posture and low back stiffness and weakness.  The hip and back will be the emphasis of treatment while the shoulder will also be addressed and should recover quickly.    Personal Factors and Comorbidities Comorbidity 1    Comorbidities Diabetes    Examination-Activity Limitations Sit;Sleep;Bend;Lift;Squat;Locomotion Level;Stairs;Reach Overhead;Carry    Examination-Participation Restrictions Occupation;Community Activity    Stability/Clinical Decision Making Evolving/Moderate complexity    Clinical Decision Making Moderate    Rehab Potential Good    PT Frequency 2x / week    PT Duration 8 weeks    PT Treatment/Interventions ADLs/Self Care Home Management;Cryotherapy;Traction;Stair training;Therapeutic activities;Neuromuscular re-education;Therapeutic exercise;Patient/family education;Manual techniques;Dry needling    PT Next Visit Plan Progress hip/core strength and body mechanics work    PT Home Exercise Plan Access Code: [email or web address]    Consulted and Agree with Plan of Care Patient           Patient will benefit from skilled therapeutic intervention in order to improve the following deficits and impairments:  Decreased activity tolerance, Decreased endurance, Decreased range of motion, Decreased strength, Increased muscle spasms, Impaired flexibility, Increased edema, Impaired UE functional use, Improper body mechanics, Postural dysfunction, Pain  Visit Diagnosis: Abnormal posture  Stiffness of right shoulder, not elsewhere classified  Stiffness of left hip, not elsewhere classified  Muscle weakness (generalized)  Chronic right shoulder pain  Pain in left hip     Problem List Patient Active Problem List   Diagnosis Date Noted  . Hepatic cyst 05/12/2018  .  Routine general medical examination at a health care facility 07/16/2016  . Right knee pain 12/26/2015  . Type 2 diabetes mellitus with hyperglycemia, without long-term current use of insulin ([place]) 10/17/2015  . Depression 05/16/2014  . Essential hypertension, benign 03/08/2013  . DJD (degenerative joint disease) 06/16/2012  . HYPERCHOLESTEROLEMIA 08/18/2008  . ANEMIA, MILD 08/18/2008  . ANXIETY DISORDER, GENERALIZED 12/11/2006  . ALLERGIC RHINITIS 12/11/2006  . GERD 12/11/2006    Farley Ly PT, MPT 12/15/2019, [facility] [address] [place], [place], [address] Phone: [phone]   Fax:  [phone]  Name: Brenda [name] MRN: [id number] Date of Birth: 1965/02/16

## 2019-12-17 NOTE — Progress Notes (Signed)
   I, Wendy Poet, LAT, ATC, am serving as scribe for Dr. [name].  Brenda [name] is a 54 y.o. female who presents to [place] [facility] today for f/u of L hip pain.  She was last seen by Dr. [name] on 11/10/19 and noted con't L hip pain.  She was re-referred to PT due to a change in her living situation and need for a different location.  She has completed 1 PT session since her last visit.  Since her last visit w/ Dr. [name], pt reports that her L hip is swollen.  She states that her R arm is doing better.  She has more PT visits scheduled.  She thinks she is able to return to work if she is allowed to sit as needed at work.  She does not think that she needs any other restrictions..  She thinks she can return to work on Monday the 15th.   Pertinent review of systems: No fevers or chills  Relevant historical information: Hypertension, diabetes   Exam:  BP 130/78 (BP Location: Right Arm, Patient Position: Sitting, Cuff Size: Normal)   Pulse 97   Ht 5\' 2"  (1.575 m)   Wt 140 lb 9.6 oz (63.8 kg)   LMP 11/26/2013   SpO2 97%   BMI 25.72 kg/m  General: Well Developed, well nourished, and in no acute distress.   MSK: Left hip normal-appearing normal gait     Assessment and Plan: 54 y.o. female with pain thought to be trochanteric bursitis.  Starting physical therapy now.  Likely able to return to work on 15 November with a restriction to allow sitting as needed.  Plan to continue the physical therapy.  We will modified work note and forms to allow for sitting as needed.  Recheck back as needed.  May need to do FMLA paperwork to allow to her to attend physical therapy weekly.    Discussed warning signs or symptoms. Please see discharge instructions. Patient expresses understanding.   The above documentation has been reviewed and is accurate and complete [name], M.D.

## 2019-12-18 ENCOUNTER — Other Ambulatory Visit: Payer: Self-pay | Admitting: Family Medicine

## 2019-12-18 DIAGNOSIS — E1165 Type 2 diabetes mellitus with hyperglycemia: Secondary | ICD-10-CM

## 2019-12-18 MED ORDER — PEN NEEDLES 32G X 4 MM MISC: 1 refills | Status: DC

## 2019-12-18 MED ORDER — GLUCOSE BLOOD VI STRP: ORAL_STRIP | 1 refills | Status: DC

## 2019-12-18 MED ORDER — NOVOLIN 70/30 FLEXPEN (70-30) 100 UNIT/ML ~~LOC~~ SUPN: 5.0000 [IU] | PEN_INJECTOR | Freq: Two times a day (BID) | SUBCUTANEOUS | 1 refills | Status: DC

## 2019-12-18 NOTE — Progress Notes (Signed)
Patient prev had trouble paying for DM meds and supplies, was able to get funding now and needs rx sent in.  Sent.  Routed to PCP as FYI and for f/u.  Reported was taking 10 units total per day, so rx adjusted to 5units BID.

## 2019-12-20 ENCOUNTER — Ambulatory Visit (INDEPENDENT_AMBULATORY_CARE_PROVIDER_SITE_OTHER): Payer: BC Managed Care – PPO | Admitting: Family Medicine

## 2019-12-20 ENCOUNTER — Other Ambulatory Visit: Payer: Self-pay

## 2019-12-20 ENCOUNTER — Encounter: Payer: Self-pay | Admitting: Family Medicine

## 2019-12-20 VITALS — BP 130/78 | HR 97 | Ht 62.0 in | Wt 140.6 lb

## 2019-12-20 DIAGNOSIS — M25552 Pain in left hip: Secondary | ICD-10-CM

## 2019-12-20 NOTE — Patient Instructions (Signed)
Thank you for coming in today.  Keep working on your PT and home exercises.   If you need time off of work to attend PT that may require a new or separate FMLA. It helps me get the forms right if you state that on the cover sheet or form when you drop it off.   Recheck as needed for this.

## 2019-12-20 NOTE — Progress Notes (Signed)
Contact patient to schedule F/up appt for DM and HTN f/up with me. Needs to me F2F. Thank you

## 2019-12-24 ENCOUNTER — Telehealth: Payer: Self-pay

## 2019-12-24 NOTE — Telephone Encounter (Signed)
Patient called stating that the Tizanidine makes her go to bed so she cannot take it during the day. States that she was told tramadol or hydrocodone could/would be sent in to help during the day.

## 2019-12-27 ENCOUNTER — Encounter: Payer: Self-pay | Admitting: Nurse Practitioner

## 2019-12-27 ENCOUNTER — Telehealth (INDEPENDENT_AMBULATORY_CARE_PROVIDER_SITE_OTHER): Payer: BC Managed Care – PPO | Admitting: Nurse Practitioner

## 2019-12-27 VITALS — BP 155/86 | Ht 62.0 in | Wt 140.0 lb

## 2019-12-27 DIAGNOSIS — F411 Generalized anxiety disorder: Secondary | ICD-10-CM

## 2019-12-27 DIAGNOSIS — I10 Essential (primary) hypertension: Secondary | ICD-10-CM | POA: Diagnosis not present

## 2019-12-27 DIAGNOSIS — E1165 Type 2 diabetes mellitus with hyperglycemia: Secondary | ICD-10-CM

## 2019-12-27 DIAGNOSIS — Z1231 Encounter for screening mammogram for malignant neoplasm of breast: Secondary | ICD-10-CM

## 2019-12-27 DIAGNOSIS — E782 Mixed hyperlipidemia: Secondary | ICD-10-CM

## 2019-12-27 DIAGNOSIS — Z1211 Encounter for screening for malignant neoplasm of colon: Secondary | ICD-10-CM

## 2019-12-27 DIAGNOSIS — Z794 Long term (current) use of insulin: Secondary | ICD-10-CM

## 2019-12-27 DIAGNOSIS — F331 Major depressive disorder, recurrent, moderate: Secondary | ICD-10-CM

## 2019-12-27 MED ORDER — MIRTAZAPINE 7.5 MG PO TABS: 7.5000 mg | ORAL_TABLET | Freq: Every evening | ORAL | 5 refills | Status: DC | PRN

## 2019-12-27 NOTE — Assessment & Plan Note (Signed)
Elevated BP today Previous BP readings were at goal BP Readings from Last 3 Encounters:  12/27/19 (!) 155/86  12/20/19 130/78  11/10/19 140/88   Reports compliance with amlodipine and lisinopril Repeat BMP Maintain medications

## 2019-12-27 NOTE — Assessment & Plan Note (Signed)
Current use of atorvastatin with no adverse side effects  ASCVD risk of 18.6%  Repeat lipid panel Encourage to maintain DASH diet. Limit exercise due to ongoing hip and back pain

## 2019-12-27 NOTE — Assessment & Plan Note (Addendum)
Last HgbA1c of 7.8 No fasting glucose Post-prandial glucose:>200 Compliant with insulin 5units BID Previous use of metformin and glipizide (unsure why medications were discontinued) No hypoglycemia, no neuropathy Admits to high carb/sugar diet. LDL not at goal, current use of atorvastatin  advised to schedule annual DM eye exam  Repeat HgbA1c and urine microalbumin

## 2019-12-27 NOTE — Telephone Encounter (Signed)
Spoke with patient; she is having too much pain and is unable to take the Tizanidine during the day. Pt asked if she could have something to help w/ the pain during the day.

## 2019-12-27 NOTE — Assessment & Plan Note (Signed)
Stable mood with remeron prn at hs and effexor daily. Persistent lack of motivation and procastination. No SI Previous referral to psychiatrist, but she did not schedule. She plans to schedule appt with therapist.  Maintain current medication F/up in 52months

## 2019-12-27 NOTE — Progress Notes (Signed)
Virtual Visit via Video Note  I connected with@ on 12/27/19 at 11:30 AM EST by a video enabled telemedicine application and verified that I am speaking with the correct person using two identifiers.  Location: Patient:Home Provider: Office Participants: patient and provider  I discussed the limitations of evaluation and management by telemedicine and the availability of in person appointments. I also discussed with the patient that there may be a patient responsible charge related to this service. The patient expressed understanding and agreed to proceed.  CC:DM, HTN, depression and hyperlipidemia f/up.  History of Present Illness: Essential hypertension, benign Elevated BP today Previous BP readings were at goal BP Readings from Last 3 Encounters:  12/27/19 (!) 155/86  12/20/19 130/78  11/10/19 140/88   Reports compliance with amlodipine and lisinopril Repeat BMP Maintain medications  Type 2 diabetes mellitus with hyperglycemia, without long-term current use of insulin (H[facility]) Last HgbA1c of 7.8 No fasting glucose Post-prandial glucose:>200 Compliant with insulin 5units BID Previous use of metformin and glipizide (unsure why medications were discontinued) No hypoglycemia, no neuropathy Admits to high carb/sugar diet. LDL not at goal, current use of atorvastatin  advised to schedule annual DM eye exam  Repeat HgbA1c and urine microalbumin  Hyperlipidemia LDL goal <100 Current use of atorvastatin with no adverse side effects  ASCVD risk of 18.6%  Repeat lipid panel Encourage to maintain DASH diet. Limit exercise due to ongoing hip and back pain   Depression Stable mood with remeron prn at hs and effexor daily. Persistent lack of motivation and procastination. No SI Previous referral to psychiatrist, but she did not schedule. She plans to schedule appt with therapist.  Maintain current medication F/up in 74months  Observations/Objective: Physical  Exam Constitutional:      General: She is not in acute distress. Cardiovascular:     Rate and Rhythm: Normal rate.     Pulses: Normal pulses.  Pulmonary:     Effort: Pulmonary effort is normal.  Musculoskeletal:     Right lower leg: No edema.     Left lower leg: No edema.  Neurological:     Mental Status: She is alert and oriented to person, place, and time.    Assessment and Plan: Brenda [name] was seen today for follow-up.  Diagnoses and all orders for this visit:  Type 2 diabetes mellitus with hyperglycemia, with long-term current use of insulin ([place]) -     Hemoglobin A1c; Future -     Basic metabolic panel; Future -     Microalbumin / creatinine urine ratio; Future  ANXIETY DISORDER, GENERALIZED -     mirtazapine (REMERON) 7.5 MG tablet; Take 1 tablet (7.5 mg total) by mouth at bedtime as needed.  Mixed hyperlipidemia -     Lipid panel; Future  Essential hypertension, benign -     Basic metabolic panel; Future  Colon cancer screening -     Ambulatory referral to Gastroenterology  Breast cancer screening by mammogram -     MM 3D SCREEN BREAST BILATERAL; Future  Moderate episode of recurrent major depressive disorder ([place])   Follow Up Instructions: Go to lab for blood draw. Maintain current medications Schedule appt for mammogram and colonoscopy. F/up in 45months   I discussed the assessment and treatment plan with the patient. The patient was provided an opportunity to ask questions and all were answered. The patient agreed with the plan and demonstrated an understanding of the instructions.   The patient was advised to call back or seek an in-person evaluation  if the symptoms worsen or if the condition fails to improve as anticipated.  Wilfred Lacy, NP

## 2019-12-27 NOTE — Telephone Encounter (Signed)
Both of those medicines will likely be worse than tizanidine for during the day.

## 2019-12-28 ENCOUNTER — Telehealth: Payer: Self-pay

## 2019-12-28 ENCOUNTER — Encounter: Payer: BC Managed Care – PPO | Admitting: Physical Therapy

## 2019-12-28 ENCOUNTER — Other Ambulatory Visit: Payer: BC Managed Care – PPO

## 2019-12-28 ENCOUNTER — Telehealth: Payer: Self-pay | Admitting: Family Medicine

## 2019-12-28 DIAGNOSIS — F331 Major depressive disorder, recurrent, moderate: Secondary | ICD-10-CM

## 2019-12-28 DIAGNOSIS — F411 Generalized anxiety disorder: Secondary | ICD-10-CM

## 2019-12-28 NOTE — Telephone Encounter (Signed)
Patient following up from [place] yesterday.  She would like a referral sent to [facility].  Please advise.

## 2019-12-28 NOTE — Telephone Encounter (Signed)
Pt would like her RTW note changed to returning on 01/10/2020 ( instead of 12/27/2019 ).  All other info in the last letter is correct (limitations).

## 2019-12-28 NOTE — Telephone Encounter (Signed)
I called pt to follow up about missing her therapy appointment today. Pt reported she forgot. Pt was reminded of her upcoming appointment on Thursday 12/30/2019 at 1:00pm.   Kearney Hard, PT,  MPT 12/28/19 4:12 PM

## 2019-12-28 NOTE — Telephone Encounter (Signed)
I do not think there is a good medicine option for you during the day that would not cause sleepiness worse than tizanidine.  Maximize Tylenol and physical therapy.

## 2019-12-28 NOTE — Telephone Encounter (Signed)
Spoke w/ pt and discussed Dr. Clovis Riley medication recommendation. Pt understood information discussed.

## 2019-12-29 ENCOUNTER — Encounter: Payer: Self-pay | Admitting: Family Medicine

## 2019-12-29 NOTE — Telephone Encounter (Signed)
Patient called stating that they are needing a more detailed note. She asked if the same note from 11/08 could be used and just change the return to work date.  Please advise.

## 2019-12-29 NOTE — Telephone Encounter (Signed)
Work note return to work on 11/29 sent electronically to [employer].  Additionally copy was printed and signed.

## 2019-12-29 NOTE — Telephone Encounter (Signed)
Ok to enter referral 

## 2019-12-29 NOTE — Telephone Encounter (Signed)
Note revised and sent via mychart. A copy was printed and signed and ready for pickup at the front desk if needed.

## 2019-12-30 ENCOUNTER — Telehealth: Payer: Self-pay | Admitting: Nurse Practitioner

## 2019-12-30 ENCOUNTER — Telehealth: Payer: Self-pay | Admitting: Rehabilitative and Restorative Service Providers"

## 2019-12-30 ENCOUNTER — Encounter: Payer: BC Managed Care – PPO | Admitting: Rehabilitative and Restorative Service Providers"

## 2019-12-30 NOTE — Telephone Encounter (Signed)
Left message regarding 2nd consecutive no show.  No other appointments scheduled.  Left Kisha's direct number to call for rescheduling if interested in continuing PT.

## 2019-12-30 NOTE — Telephone Encounter (Signed)
Pt was no show for appt 12/06/19 for cpe. Pt has been seen for acute visit since & has rescheduled cpe for 03/2020.  3rd occurrence. No follow up needed. Letter mailed that additional no shows may result in dismissal.

## 2020-01-03 ENCOUNTER — Other Ambulatory Visit: Payer: BC Managed Care – PPO

## 2020-01-04 ENCOUNTER — Encounter: Payer: BC Managed Care – PPO | Admitting: Rehabilitative and Restorative Service Providers"

## 2020-01-10 ENCOUNTER — Encounter: Payer: BC Managed Care – PPO | Admitting: Physical Therapy

## 2020-01-10 ENCOUNTER — Other Ambulatory Visit: Payer: Self-pay | Admitting: Physical Therapy

## 2020-01-10 ENCOUNTER — Telehealth: Payer: Self-pay | Admitting: Family Medicine

## 2020-01-10 DIAGNOSIS — M25552 Pain in left hip: Secondary | ICD-10-CM

## 2020-01-10 DIAGNOSIS — M25511 Pain in right shoulder: Secondary | ICD-10-CM

## 2020-01-10 NOTE — Telephone Encounter (Signed)
Called pt, advised new referral place for [facility].

## 2020-01-10 NOTE — Telephone Encounter (Signed)
Patient called requesting a new referral for PT at [facility]. I think she cancelled a few appts with them, so they are requiring a new referral.

## 2020-01-10 NOTE — Telephone Encounter (Signed)
New PT referral placed for L hip and R shoulder pain to [facility].

## 2020-01-11 ENCOUNTER — Encounter: Payer: Self-pay | Admitting: Physical Therapy

## 2020-01-11 ENCOUNTER — Ambulatory Visit (INDEPENDENT_AMBULATORY_CARE_PROVIDER_SITE_OTHER): Payer: BC Managed Care – PPO | Admitting: Physical Therapy

## 2020-01-11 ENCOUNTER — Other Ambulatory Visit: Payer: Self-pay

## 2020-01-11 DIAGNOSIS — M25511 Pain in right shoulder: Secondary | ICD-10-CM

## 2020-01-11 DIAGNOSIS — M6281 Muscle weakness (generalized): Secondary | ICD-10-CM

## 2020-01-11 DIAGNOSIS — G8929 Other chronic pain: Secondary | ICD-10-CM

## 2020-01-11 DIAGNOSIS — R262 Difficulty in walking, not elsewhere classified: Secondary | ICD-10-CM | POA: Diagnosis not present

## 2020-01-11 DIAGNOSIS — M25552 Pain in left hip: Secondary | ICD-10-CM

## 2020-01-11 DIAGNOSIS — R293 Abnormal posture: Secondary | ICD-10-CM | POA: Diagnosis not present

## 2020-01-11 DIAGNOSIS — M25611 Stiffness of right shoulder, not elsewhere classified: Secondary | ICD-10-CM

## 2020-01-11 DIAGNOSIS — M25652 Stiffness of left hip, not elsewhere classified: Secondary | ICD-10-CM | POA: Diagnosis not present

## 2020-01-11 NOTE — Therapy (Signed)
[facility] Physical Therapy [address] [place], [place], [address] Phone: [phone]   Fax:  [phone]  Physical Therapy Treatment  Patient Details  Name: BETTY DAIDONE MRN: [id number] Date of Birth: 04-23-1965 Referring Provider (PT): Gregor Hams MD   Encounter Date: 01/11/2020   PT End of Session - 01/11/20 1414    Visit Number 2    Number of Visits 16    PT Start Time 4010    PT Stop Time 2725    PT Time Calculation (min) 30 min    Activity Tolerance Patient tolerated treatment well;No increased pain    Behavior During Therapy WFL for tasks assessed/performed           Past Medical History:  Diagnosis Date  . Allergic rhinitis, cause unspecified   . Anemia, unspecified   . Diabetes mellitus without complication ([place])   . Esophageal reflux   . Generalized anxiety disorder   . Headache(784.0)   . Hypertension   . Pure hypercholesterolemia   . Rash and other nonspecific skin eruption   . Unspecified vitamin D deficiency     Past Surgical History:  Procedure Laterality Date  . TENDON REPAIR  1990   left index finger    There were no vitals filed for this visit.   Subjective Assessment - 01/11/20 1406    Subjective Pt arriving today reporting 4/10 in her L hip. Pt reporting sitting aggrevates her pain.    Limitations Sitting;House hold activities;Lifting    How long can you walk comfortably? Not as limited    Patient Stated Goals Be able to do normal ADLs without being limited by pain.    Currently in Pain? Yes    Pain Score 4     Pain Location Hip    Pain Orientation Left    Pain Descriptors / Indicators Aching;Sore    Pain Type Chronic pain    Pain Onset More than a month ago    Pain Frequency Intermittent                             [facility] Adult PT Treatment/Exercise - 01/11/20 0001      Knee/Hip Exercises: Stretches   Hip Flexor Stretch 4 reps;20 seconds;Both   Single knee to chest   Piriformis Stretch  4 reps;20 seconds;Both   1 knee bent and opposite ankle crossed over push with hand   Gastroc Stretch Both;3 reps    Other Knee/Hip Stretches trunk rotation x 3 to each side holding 30 secaonds each      Knee/Hip Exercises: Aerobic   Nustep L4 x 7 minutes      Knee/Hip Exercises: Supine   Bridges Strengthening;10 reps;Limitations    Bridges Limitations holding 5 seconds    Other Supine Knee/Hip Exercises ball squeezes x 10 holding 5 seconds      Knee/Hip Exercises: Sidelying   Hip ABduction Strengthening;Both;15 reps      Knee/Hip Exercises: Prone   Other Prone Exercises quad stretch x 2 holding 30 seconds      Shoulder Exercises: Supine   Internal Rotation Left;10 reps   10 seconds     Manual Therapy   Manual therapy comments Percussion to L lateral hip and L IT band, L piriformis                  PT Education - 01/11/20 1413    Education Details exercise technique    Person(s) Educated  Patient    Methods Explanation;Demonstration    Comprehension Verbalized understanding;Returned demonstration               PT Long Term Goals - 01/11/20 1417      PT LONG TERM GOAL #1   Title Improve FOTO score to 68.    Status On-going      PT LONG TERM GOAL #2   Title Improve L hip pain to 0-3/10 with no reports of sciatica or R shoulder pain.    Baseline 5-6/10 and sciatica    Status On-going      PT LONG TERM GOAL #3   Title Improve L hip strength as assessed by MMT scores and functional self-report.    Status On-going      PT LONG TERM GOAL #4   Title Leala will be independent with her HEP at DC.    Status On-going      PT LONG TERM GOAL #5   Title Patient to demonstrate B LE strength >/=4+/5.    Status On-going                 Plan - 01/11/20 1415    Clinical Impression Statement Pt tolerating treatment exercises well concentration on Left hip this visit. Progressing toward strengtheing and stretching with cues for core activation. Continue  skilled PT.    Personal Factors and Comorbidities Comorbidity 1    Comorbidities Diabetes    Examination-Activity Limitations Sit;Sleep;Bend;Lift;Squat;Locomotion Level;Stairs;Reach Overhead;Carry    Examination-Participation Restrictions Occupation;Community Activity    Stability/Clinical Decision Making Evolving/Moderate complexity    Rehab Potential Good    PT Frequency 2x / week    PT Treatment/Interventions ADLs/Self Care Home Management;Cryotherapy;Traction;Stair training;Therapeutic activities;Neuromuscular re-education;Therapeutic exercise;Patient/family education;Manual techniques;Dry needling    PT Next Visit Plan Progress hip/core strength and body mechanics work    PT Home Exercise Plan Access Code: [email or web address]    Consulted and Agree with Plan of Care Patient           Patient will benefit from skilled therapeutic intervention in order to improve the following deficits and impairments:  Decreased activity tolerance, Decreased endurance, Decreased range of motion, Decreased strength, Increased muscle spasms, Impaired flexibility, Increased edema, Impaired UE functional use, Improper body mechanics, Postural dysfunction, Pain  Visit Diagnosis: Abnormal posture  Stiffness of left hip, not elsewhere classified  Difficulty in walking, not elsewhere classified  Muscle weakness (generalized)  Pain in left hip  Stiffness of right shoulder, not elsewhere classified  Chronic right shoulder pain  Acute pain of right shoulder     Problem List Patient Active Problem List   Diagnosis Date Noted  . Hepatic cyst 05/12/2018  . Routine general medical examination at a health care facility 07/16/2016  . Right knee pain 12/26/2015  . Type 2 diabetes mellitus with hyperglycemia, without long-term current use of insulin ([place]) 10/17/2015  . Depression 05/16/2014  . Essential hypertension, benign 03/08/2013  . DJD (degenerative joint disease) 06/16/2012  . Hyperlipidemia LDL goal  <100 08/18/2008  . ANEMIA, MILD 08/18/2008  . ANXIETY DISORDER, GENERALIZED 12/11/2006  . ALLERGIC RHINITIS 12/11/2006  . GERD 12/11/2006    Oretha Caprice, PT MPT 01/11/2020, 2:31 PM  [facility] Physical Therapy [address] [place], [place], [address] Phone: [phone]   Fax:  [phone]  Name: LEXUS SHAMPINE MRN: [id number] Date of Birth: 1965/11/16

## 2020-01-12 ENCOUNTER — Encounter: Payer: BC Managed Care – PPO | Admitting: Rehabilitative and Restorative Service Providers"

## 2020-01-13 ENCOUNTER — Encounter: Payer: BC Managed Care – PPO | Admitting: Rehabilitative and Restorative Service Providers"

## 2020-01-20 ENCOUNTER — Other Ambulatory Visit: Payer: Self-pay | Admitting: Nurse Practitioner

## 2020-01-20 ENCOUNTER — Encounter: Payer: BC Managed Care – PPO | Admitting: Rehabilitative and Restorative Service Providers"

## 2020-01-20 ENCOUNTER — Ambulatory Visit (INDEPENDENT_AMBULATORY_CARE_PROVIDER_SITE_OTHER): Payer: BC Managed Care – PPO | Admitting: Physical Therapy

## 2020-01-20 ENCOUNTER — Encounter: Payer: Self-pay | Admitting: Physical Therapy

## 2020-01-20 ENCOUNTER — Other Ambulatory Visit: Payer: Self-pay

## 2020-01-20 DIAGNOSIS — F411 Generalized anxiety disorder: Secondary | ICD-10-CM

## 2020-01-20 DIAGNOSIS — M6281 Muscle weakness (generalized): Secondary | ICD-10-CM

## 2020-01-20 DIAGNOSIS — M25552 Pain in left hip: Secondary | ICD-10-CM

## 2020-01-20 DIAGNOSIS — R293 Abnormal posture: Secondary | ICD-10-CM

## 2020-01-20 DIAGNOSIS — M25511 Pain in right shoulder: Secondary | ICD-10-CM

## 2020-01-20 DIAGNOSIS — R262 Difficulty in walking, not elsewhere classified: Secondary | ICD-10-CM

## 2020-01-20 DIAGNOSIS — M25652 Stiffness of left hip, not elsewhere classified: Secondary | ICD-10-CM | POA: Diagnosis not present

## 2020-01-20 DIAGNOSIS — M25611 Stiffness of right shoulder, not elsewhere classified: Secondary | ICD-10-CM

## 2020-01-20 DIAGNOSIS — G8929 Other chronic pain: Secondary | ICD-10-CM

## 2020-01-20 NOTE — Therapy (Signed)
[facility] Physical Therapy [address] [place], [place], [address] Phone: [phone]   Fax:  [phone]  Physical Therapy Treatment  Patient Details  Name: Brenda [name] MRN: [id number] Date of Birth: Mar 03, 1965 Referring Provider (PT): Gregor Hams MD   Encounter Date: 01/20/2020   PT End of Session - 01/20/20 1351    Visit Number 3    Number of Visits 16    PT Start Time 6144    PT Stop Time 1430    PT Time Calculation (min) 53 min    Activity Tolerance Patient tolerated treatment well;No increased pain    Behavior During Therapy WFL for tasks assessed/performed           Past Medical History:  Diagnosis Date  . Allergic rhinitis, cause unspecified   . Anemia, unspecified   . Diabetes mellitus without complication ([place])   . Esophageal reflux   . Generalized anxiety disorder   . Headache(784.0)   . Hypertension   . Pure hypercholesterolemia   . Rash and other nonspecific skin eruption   . Unspecified vitamin D deficiency     Past Surgical History:  Procedure Laterality Date  . TENDON REPAIR  1990   left index finger    There were no vitals filed for this visit.   Subjective Assessment - 01/20/20 1350    Subjective Relays she is now back to work and her body has to get used to used to this relays 6/10 overall pain in her Lt hip and Rt shoulder    Limitations Sitting;House hold activities;Lifting    How long can you sit comfortably? < 1 hour    How long can you stand comfortably? < 1 hour    How long can you walk comfortably? Not as limited    Patient Stated Goals Be able to do normal ADLs without being limited by pain.    Pain Onset More than a month ago            [facility] Adult PT Treatment/Exercise - 01/20/20 0001      Knee/Hip Exercises: Stretches   Hip Flexor Stretch Both;3 reps;30 seconds    Hip Flexor Stretch Limitations SKTC    Piriformis Stretch Left;3 reps;30 seconds    Other Knee/Hip Stretches trunk rotation x 5 to  each side holding 10 secaonds each      Knee/Hip Exercises: Aerobic   Nustep L4 x 7 minutes      Knee/Hip Exercises: Supine   Bridges 10 reps    Bridges Limitations 2 sets, holding 5 seconds    Straight Leg Raises Both;10 reps    Other Supine Knee/Hip Exercises supine clams green X 20      Knee/Hip Exercises: Sidelying   Hip ABduction 10 reps;Left      Shoulder Exercises: Standing   External Rotation Left;20 reps    Theraband Level (Shoulder External Rotation) Level 3 (Green)    Internal Rotation Left;20 reps    Theraband Level (Shoulder Internal Rotation) Level 3 (Green)    Extension Both;20 reps    Theraband Level (Shoulder Extension) Level 3 (Green)    Row Both;20 reps    Theraband Level (Shoulder Row) Level 3 (Green)      Modalities   Modalities Cryotherapy      Cryotherapy   Number Minutes Cryotherapy 10 Minutes    Cryotherapy Location Hip    Type of Cryotherapy Ice pack      Manual Therapy   Manual therapy comments Percussion to L lateral  hip and L IT band, L piriformis                       PT Long Term Goals - 01/11/20 1417      PT LONG TERM GOAL #1   Title Improve FOTO score to 68.    Status On-going      PT LONG TERM GOAL #2   Title Improve L hip pain to 0-3/10 with no reports of sciatica or R shoulder pain.    Baseline 5-6/10 and sciatica    Status On-going      PT LONG TERM GOAL #3   Title Improve L hip strength as assessed by MMT scores and functional self-report.    Status On-going      PT LONG TERM GOAL #4   Title Jaylise will be independent with her HEP at DC.    Status On-going      PT LONG TERM GOAL #5   Title Patient to demonstrate B LE strength >/=4+/5.    Status On-going                 Plan - 01/20/20 1433    Clinical Impression Statement Session focused on stretching for Lt hip and strengthening for Lt hip and Rt shoulder as tolerated. Appeared to have less overall pain after session after manual therapy and  ice. Continue POC    Personal Factors and Comorbidities Comorbidity 1    Comorbidities Diabetes    Examination-Activity Limitations Sit;Sleep;Bend;Lift;Squat;Locomotion Level;Stairs;Reach Overhead;Carry    Examination-Participation Restrictions Occupation;Community Activity    Stability/Clinical Decision Making Evolving/Moderate complexity    Rehab Potential Good    PT Frequency 2x / week    PT Treatment/Interventions ADLs/Self Care Home Management;Cryotherapy;Traction;Stair training;Therapeutic activities;Neuromuscular re-education;Therapeutic exercise;Patient/family education;Manual techniques;Dry needling    PT Next Visit Plan Progress hip/core strength and body mechanics work    PT Home Exercise Plan Access Code: [email or web address]    Consulted and Agree with Plan of Care Patient           Patient will benefit from skilled therapeutic intervention in order to improve the following deficits and impairments:  Decreased activity tolerance,Decreased endurance,Decreased range of motion,Decreased strength,Increased muscle spasms,Impaired flexibility,Increased edema,Impaired UE functional use,Improper body mechanics,Postural dysfunction,Pain  Visit Diagnosis: Abnormal posture  Stiffness of left hip, not elsewhere classified  Difficulty in walking, not elsewhere classified  Muscle weakness (generalized)  Pain in left hip  Stiffness of right shoulder, not elsewhere classified  Chronic right shoulder pain     Problem List Patient Active Problem List   Diagnosis Date Noted  . Hepatic cyst 05/12/2018  . Routine general medical examination at a health care facility 07/16/2016  . Right knee pain 12/26/2015  . Type 2 diabetes mellitus with hyperglycemia, without long-term current use of insulin ([place]) 10/17/2015  . Depression 05/16/2014  . Essential hypertension, benign 03/08/2013  . DJD (degenerative joint disease) 06/16/2012  . Hyperlipidemia LDL goal <100 08/18/2008  . ANEMIA, MILD  08/18/2008  . ANXIETY DISORDER, GENERALIZED 12/11/2006  . ALLERGIC RHINITIS 12/11/2006  . GERD 12/11/2006    Brenda [name] 01/20/2020, 2:34 PM  [facility] Physical Therapy [address] [place], [place], [address] Phone: [phone]   Fax:  [phone]  Name: Brenda [name] MRN: [id number] Date of Birth: 11/05/1965

## 2020-01-21 NOTE — Telephone Encounter (Signed)
Last VV 12/27/19 Last fill 12/27/19  #30/5 Pt requesting 90 day supply.  Please see message and advise.  Thank you.

## 2020-01-25 ENCOUNTER — Encounter: Payer: BC Managed Care – PPO | Admitting: Physical Therapy

## 2020-01-28 ENCOUNTER — Encounter: Payer: BC Managed Care – PPO | Admitting: Rehabilitative and Restorative Service Providers"

## 2020-02-01 ENCOUNTER — Encounter: Payer: Self-pay | Admitting: Physical Therapy

## 2020-02-01 ENCOUNTER — Other Ambulatory Visit: Payer: Self-pay

## 2020-02-01 ENCOUNTER — Ambulatory Visit (INDEPENDENT_AMBULATORY_CARE_PROVIDER_SITE_OTHER): Payer: BC Managed Care – PPO | Admitting: Physical Therapy

## 2020-02-01 DIAGNOSIS — M25652 Stiffness of left hip, not elsewhere classified: Secondary | ICD-10-CM

## 2020-02-01 DIAGNOSIS — M25511 Pain in right shoulder: Secondary | ICD-10-CM

## 2020-02-01 DIAGNOSIS — M25552 Pain in left hip: Secondary | ICD-10-CM

## 2020-02-01 DIAGNOSIS — R293 Abnormal posture: Secondary | ICD-10-CM

## 2020-02-01 DIAGNOSIS — M6281 Muscle weakness (generalized): Secondary | ICD-10-CM | POA: Diagnosis not present

## 2020-02-01 DIAGNOSIS — G8929 Other chronic pain: Secondary | ICD-10-CM

## 2020-02-01 DIAGNOSIS — M25611 Stiffness of right shoulder, not elsewhere classified: Secondary | ICD-10-CM

## 2020-02-01 DIAGNOSIS — R262 Difficulty in walking, not elsewhere classified: Secondary | ICD-10-CM

## 2020-02-01 NOTE — Therapy (Addendum)
[facility] Physical Therapy [address] [place], [place], [address] Phone: [phone]   Fax:  [phone]  Physical Therapy Treatment Discharge Summary  Patient Details  Name: Brenda [name] MRN: [id number] Date of Birth: [birth year] Referring Provider (PT): Gregor Hams MD   Encounter Date: 02/01/2020   PT End of Session - 02/01/20 1352    Visit Number 4    Number of Visits 16    PT Start Time 7672    PT Stop Time 0947    PT Time Calculation (min) 42 min    Activity Tolerance Patient tolerated treatment well;No increased pain    Behavior During Therapy WFL for tasks assessed/performed           Past Medical History:  Diagnosis Date  . Allergic rhinitis, cause unspecified   . Anemia, unspecified   . Diabetes mellitus without complication ([place])   . Esophageal reflux   . Generalized anxiety disorder   . Headache(784.0)   . Hypertension   . Pure hypercholesterolemia   . Rash and other nonspecific skin eruption   . Unspecified vitamin D deficiency     Past Surgical History:  Procedure Laterality Date  . TENDON REPAIR  1990   left index finger    There were no vitals filed for this visit.   Subjective Assessment - 02/01/20 1350    Subjective Pt arriving to therapy reporting 4-5/10 pain in R shoulder and L hip. pt states she is back at work.    Limitations Sitting;House hold activities;Lifting    How long can you sit comfortably? < 1 hour    How long can you stand comfortably? < 1 hour    How long can you walk comfortably? Not as limited    Patient Stated Goals Be able to do normal ADLs without being limited by pain.    Currently in Pain? Yes    Pain Score 5     Pain Location Hip    Pain Orientation Left    Pain Descriptors / Indicators Aching;Sore    Pain Type Chronic pain    Pain Onset More than a month ago    Multiple Pain Sites Yes    Pain Score 5    Pain Location Shoulder    Pain Orientation Right    Pain Descriptors /  Indicators Aching;Sore    Pain Type Chronic pain                             [facility] Adult PT Treatment/Exercise - 02/01/20 0001      Knee/Hip Exercises: Stretches   Hip Flexor Stretch Both;3 reps;30 seconds    Hip Flexor Stretch Limitations SKTC    Piriformis Stretch Left;3 reps;30 seconds    Other Knee/Hip Stretches trunk rotation x 5 to each side holding 10 secaonds each      Knee/Hip Exercises: Aerobic   Recumbent Bike L2 8 minutes      Knee/Hip Exercises: Supine   Bridges 10 reps    Bridges Limitations 5 second hold    Straight Leg Raises Both;10 reps    Other Supine Knee/Hip Exercises supine clams green X 20      Knee/Hip Exercises: Sidelying   Hip ABduction 10 reps;Left      Shoulder Exercises: Standing   External Rotation Left;20 reps    Theraband Level (Shoulder External Rotation) Level 3 (Green)    Internal Rotation Left;20 reps    Theraband Level (Shoulder Internal  Rotation) Level 3 (Green)    Extension Both;20 reps    Theraband Level (Shoulder Extension) Level 3 (Green)    Row Both;20 reps    Theraband Level (Shoulder Row) Level 3 (Green)      Manual Therapy   Manual therapy comments Percussion to L lateral hip and L IT band, L piriformis   5                      PT Long Term Goals - 02/01/20 1358      PT LONG TERM GOAL #1   Title Improve FOTO score to 68.    Status On-going      PT LONG TERM GOAL #2   Title Improve L hip pain to 0-3/10 with no reports of sciatica or R shoulder pain.    Status On-going      PT LONG TERM GOAL #3   Title Improve L hip strength as assessed by MMT scores and functional self-report.    Status On-going      PT LONG TERM GOAL #4   Title Leland will be independent with her HEP at DC.    Status On-going      PT LONG TERM GOAL #5   Title Patient to demonstrate B LE strength >/=4+/5.    Status On-going                 Plan - 02/01/20 1353    Clinical Impression Statement Pt  tolerating treatment well progressing with strengthening. Pt reporting less pain after exercises and manual therapy percussion. Continue skilled PT.    Personal Factors and Comorbidities Comorbidity 1    Comorbidities Diabetes    Examination-Activity Limitations Sit;Sleep;Bend;Lift;Squat;Locomotion Level;Stairs;Reach Overhead;Carry    Examination-Participation Restrictions Occupation;Community Activity    Stability/Clinical Decision Making Evolving/Moderate complexity    Rehab Potential Good    PT Frequency 2x / week    PT Duration 8 weeks    PT Treatment/Interventions ADLs/Self Care Home Management;Cryotherapy;Traction;Stair training;Therapeutic activities;Neuromuscular re-education;Therapeutic exercise;Patient/family education;Manual techniques;Dry needling    PT Next Visit Plan Progress hip/core strength and body mechanics work    PT Home Exercise Plan Access Code: [email or web address]    Consulted and Agree with Plan of Care Patient           Patient will benefit from skilled therapeutic intervention in order to improve the following deficits and impairments:  Decreased activity tolerance,Decreased endurance,Decreased range of motion,Decreased strength,Increased muscle spasms,Impaired flexibility,Increased edema,Impaired UE functional use,Improper body mechanics,Postural dysfunction,Pain  Visit Diagnosis: Abnormal posture  Stiffness of left hip, not elsewhere classified  Muscle weakness (generalized)  Difficulty in walking, not elsewhere classified  Pain in left hip  Stiffness of right shoulder, not elsewhere classified  Chronic right shoulder pain    PHYSICAL THERAPY DISCHARGE SUMMARY  Visits from Start of Care: 4  Current functional level related to goals / functional outcomes: See above    Remaining deficits: See above   Education / Equipment: See above Plan: Patient agrees to discharge.  Patient goals were not met. Patient is being discharged due to not returning since  the last visit.  ?????       Problem List Patient Active Problem List   Diagnosis Date Noted  . Hepatic cyst 05/12/2018  . Routine general medical examination at a health care facility 07/16/2016  . Right knee pain 12/26/2015  . Type 2 diabetes mellitus with hyperglycemia, without long-term current use of insulin ([place]) 10/17/2015  . Depression 05/16/2014  . Essential  hypertension, benign 03/08/2013  . DJD (degenerative joint disease) 06/16/2012  . Hyperlipidemia LDL goal <100 08/18/2008  . ANEMIA, MILD 08/18/2008  . ANXIETY DISORDER, GENERALIZED 12/11/2006  . ALLERGIC RHINITIS 12/11/2006  . GERD 12/11/2006    Oretha Caprice, PT, MPT 02/01/2020, 2:29 PM  [facility] Physical Therapy [address] [place], [place], [address] Phone: [phone]   Fax:  [phone]  Name: TAMIRA RYLAND MRN: [id number] Date of Birth: 17-May-1965  Kearney Hard, PT, MPT 03/27/20 1:34 PM

## 2020-02-03 ENCOUNTER — Telehealth: Payer: Self-pay | Admitting: Rehabilitative and Restorative Service Providers"

## 2020-02-03 ENCOUNTER — Encounter: Payer: BC Managed Care – PPO | Admitting: Rehabilitative and Restorative Service Providers"

## 2020-02-03 NOTE — Telephone Encounter (Signed)
Called to check on due to no show.  No answer, no voicemail.  No other appointments scheduled.

## 2020-02-07 ENCOUNTER — Encounter: Payer: BC Managed Care – PPO | Admitting: Physical Therapy

## 2020-02-09 ENCOUNTER — Telehealth: Payer: Self-pay | Admitting: Physical Therapy

## 2020-02-09 ENCOUNTER — Encounter: Payer: BC Managed Care – PPO | Admitting: Physical Therapy

## 2020-02-09 NOTE — Telephone Encounter (Signed)
Pt no show for PT appointment today. They were contacted and informed of this by phone and she states she got her time mixed up and thought her appointment was at 1pm vs 11 am. This was the last PT visit she has scheduled so she was encouraged to call back and speak to front office if she feels she needs to schedule any more PT visits.   Ivery Quale, PT, DPT 02/09/20 11:21 AM

## 2020-02-23 ENCOUNTER — Encounter: Payer: BC Managed Care – PPO | Admitting: Physical Therapy

## 2020-02-23 NOTE — Progress Notes (Unsigned)
   I, [name], LAT, ATC acting as a scribe for [name], MD.  ANASTASSIA NOACK is a 55 y.o. female who presents to [place] [facility] today for f/u L hip pain due to GT bursitis. Pt was last seen by Dr. [name] on 12/20/19 and was advised to continue PT, of which she's completed 4 visits (and 2 no-shows). Today, pt reports hip pain has worsened. Pt locates pain to lateral aspect of L hip that radiates down to her lower leg. Pt is taking the tizanidine at night. Pt has been working, but she needs the [employer] paperwork completed.   Pertinent review of systems: No fevers or chills  Relevant historical information: Hypertension, diabetes, anxiety and depression   Exam:  BP 138/88 (BP Location: Right Arm, Patient Position: Sitting, Cuff Size: Normal)   Pulse 98   Ht 5\' 2"  (1.575 m)   Wt 146 lb 9.6 oz (66.5 kg)   LMP 11/26/2013   SpO2 96%   BMI 26.81 kg/m  General: Well Developed, well nourished, and in no acute distress.   MSK: L-spine normal-appearing nontender midline. Normal lumbar motion. Lower extremity strength is intact. Mildly positive left-sided slump test. Lower extremity reflexes are intact.  Left hip normal. Nontender greater trochanter.  Hip abduction strength is intact.    Lab and Radiology Results  X-ray images L-spine obtained today personally and independently interpreted Liberalization of sacrum.  Degenerative changes present.  Otherwise no significant findings.  No fractures.  No acute findings. Await formal radiology review    Assessment and Plan: 55 y.o. female with left lateral hip and leg pain.  Pain is more radiating down her leg into her calf than it was previously.  This is much more consistent with L5 radiculopathy.  She has failed to significantly improve with physical therapy indicates it is possible that her pain was more radicular in nature start.  Plan for course of gabapentin and backup plan for prednisone if not  improving.  If not rapidly improving will proceed MRI.  Patient is already had good trial of conservative management with physical therapy for greater than 6 weeks.   PDMP not reviewed this encounter. Orders Placed This Encounter  Procedures  . DG Lumbar Spine 2-3 Views    Standing Status:   Future    Number of Occurrences:   1    Standing Expiration Date:   02/23/2021    Order Specific Question:   Reason for Exam (SYMPTOM  OR DIAGNOSIS REQUIRED)    Answer:   eval leg pain    Order Specific Question:   Is patient pregnant?    Answer:   No    Order Specific Question:   Preferred imaging location?    Answer:   [name]   Meds ordered this encounter  Medications  . predniSONE (DELTASONE) 10 MG tablet    Sig: Take 3 tablets (30 mg total) by mouth daily with breakfast.    Dispense:  15 tablet    Refill:  0  . pregabalin (LYRICA) 75 MG capsule    Sig: Take 1 capsule (75 mg total) by mouth 2 (two) times daily as needed (nerve pain).    Dispense:  60 capsule    Refill:  1     Discussed warning signs or symptoms. Please see discharge instructions. Patient expresses understanding.   The above documentation has been reviewed and is accurate and complete [name], M.D.

## 2020-02-24 ENCOUNTER — Ambulatory Visit (INDEPENDENT_AMBULATORY_CARE_PROVIDER_SITE_OTHER): Payer: BC Managed Care – PPO | Admitting: Family Medicine

## 2020-02-24 ENCOUNTER — Ambulatory Visit (INDEPENDENT_AMBULATORY_CARE_PROVIDER_SITE_OTHER): Payer: BC Managed Care – PPO

## 2020-02-24 ENCOUNTER — Ambulatory Visit: Payer: Self-pay

## 2020-02-24 ENCOUNTER — Other Ambulatory Visit: Payer: Self-pay

## 2020-02-24 VITALS — BP 138/88 | HR 98 | Ht 62.0 in | Wt 146.6 lb

## 2020-02-24 DIAGNOSIS — M5416 Radiculopathy, lumbar region: Secondary | ICD-10-CM

## 2020-02-24 DIAGNOSIS — M25552 Pain in left hip: Secondary | ICD-10-CM

## 2020-02-24 MED ORDER — PREGABALIN 75 MG PO CAPS: 75.0000 mg | ORAL_CAPSULE | Freq: Two times a day (BID) | ORAL | 1 refills | Status: DC | PRN

## 2020-02-24 MED ORDER — PREDNISONE 10 MG PO TABS
30.0000 mg | ORAL_TABLET | Freq: Every day | ORAL | 0 refills | Status: DC
Start: 2020-02-24 — End: 2020-04-20

## 2020-02-24 NOTE — Patient Instructions (Signed)
Thank you for coming in today.  Please get an Xray today before you leave  Take the prednisone daily for 5 days.   Use lyrica mostly at bedtime for nerve pain. Ok to take twice daily if it is more helpful than it is annoying.   If not improving let me know and I will plan for MRI of the low back.    Radicular Pain Radicular pain is a type of pain that spreads from your back or neck along a spinal nerve. Spinal nerves are nerves that leave the spinal cord and go to the muscles. Radicular pain is sometimes called radiculopathy, radiculitis, or a pinched nerve. When you have this type of pain, you may also have weakness, numbness, or tingling in the area of your body that is supplied by the nerve. The pain may feel sharp and burning. Depending on which spinal nerve is affected, the pain may occur in the:  Neck area (cervical radicular pain). You may also feel pain, numbness, weakness, or tingling in the arms.  Mid-spine area (thoracic radicular pain). You would feel this pain in the back and chest. This type is rare.  Lower back area (lumbar radicular pain). You would feel this pain as low back pain. You may feel pain, numbness, weakness, or tingling in the buttocks or legs. Sciatica is a type of lumbar radicular pain that shoots down the back of the leg. Radicular pain occurs when one of the spinal nerves becomes irritated or squeezed (compressed). It is often caused by something pushing on a spinal nerve, such as one of the bones of the spine (vertebrae) or one of the round cushions between vertebrae (intervertebral disks). This can result from:  An injury.  Wear and tear or aging of a disk.  The growth of a bone spur that pushes on the nerve. Radicular pain often goes away when you follow instructions from your health care provider for relieving pain at home. Follow these instructions at home: Managing pain  If directed, put ice on the affected area: ? Put ice in a plastic  bag. ? Place a towel between your skin and the bag. ? Leave the ice on for 20 minutes, 2-3 times a day.  If directed, apply heat to the affected area as often as told by your health care provider. Use the heat source that your health care provider recommends, such as a moist heat pack or a heating pad. ? Place a towel between your skin and the heat source. ? Leave the heat on for 20-30 minutes. ? Remove the heat if your skin turns bright red. This is especially important if you are unable to feel pain, heat, or cold. You may have a greater risk of getting burned.      Activity  Do not sit or rest in bed for long periods of time.  Try to stay as active as possible. Ask your health care provider what type of exercise or activity is best for you.  Avoid activities that make your pain worse, such as bending and lifting.  Do not lift anything that is heavier than 10 lb (4.5 kg), or the limit that you are told, until your health care provider says that it is safe.  Practice using proper technique when lifting items. Proper lifting technique involves bending your knees and rising up.  Do strength and range-of-motion exercises only as told by your health care provider or physical therapist.   General instructions  Take over-the-counter and prescription  medicines only as told by your health care provider.  Pay attention to any changes in your symptoms.  Keep all follow-up visits as told by your health care provider. This is important. ? Your health care provider may send you to a physical therapist to help with this pain. Contact a health care provider if:  Your pain and other symptoms get worse.  Your pain medicine is not helping.  Your pain has not improved after a few weeks of home care.  You have a fever. Get help right away if:  You have severe pain, weakness, or numbness.  You have difficulty with bladder or bowel control. Summary  Radicular pain is a type of pain that  spreads from your back or neck along a spinal nerve.  When you have radicular pain, you may also have weakness, numbness, or tingling in the area of your body that is supplied by the nerve.  The pain may feel sharp or burning.  Radicular pain may be treated with ice, heat, medicines, or physical therapy. This information is not intended to replace advice given to you by your health care provider. Make sure you discuss any questions you have with your health care provider. Document Revised: 08/12/2017 Document Reviewed: 08/12/2017 Elsevier Patient Education  2021 [employer].

## 2020-02-25 ENCOUNTER — Telehealth: Payer: Self-pay | Admitting: Family Medicine

## 2020-02-25 NOTE — Telephone Encounter (Signed)
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 She said that she was told she would need intermittent FMLA at this time. She expressed that she has currently be out 2 days a week on average.  Please advise.

## 2020-02-25 NOTE — Progress Notes (Signed)
X-ray lumbar spine shows some arthritis but no fractures or severe changes.

## 2020-02-29 NOTE — Telephone Encounter (Signed)
FMLA completed with requests

## 2020-03-01 NOTE — Telephone Encounter (Signed)
Called pt and LM regarding the completion of her FMLA forms and that they are ready for pick-up at her convenience.

## 2020-03-06 ENCOUNTER — Encounter: Payer: Self-pay | Admitting: Family Medicine

## 2020-03-20 ENCOUNTER — Other Ambulatory Visit: Payer: Self-pay | Admitting: *Deleted

## 2020-03-20 DIAGNOSIS — M545 Low back pain, unspecified: Secondary | ICD-10-CM

## 2020-03-21 ENCOUNTER — Other Ambulatory Visit: Payer: Self-pay | Admitting: Nurse Practitioner

## 2020-03-21 DIAGNOSIS — E1165 Type 2 diabetes mellitus with hyperglycemia: Secondary | ICD-10-CM

## 2020-03-21 DIAGNOSIS — I10 Essential (primary) hypertension: Secondary | ICD-10-CM

## 2020-03-21 DIAGNOSIS — E7800 Pure hypercholesterolemia, unspecified: Secondary | ICD-10-CM

## 2020-03-21 DIAGNOSIS — K219 Gastro-esophageal reflux disease without esophagitis: Secondary | ICD-10-CM

## 2020-03-21 MED ORDER — PANTOPRAZOLE SODIUM 20 MG PO TBEC: 20.0000 mg | DELAYED_RELEASE_TABLET | Freq: Every day | ORAL | 0 refills | Status: DC

## 2020-03-21 MED ORDER — AMLODIPINE BESYLATE 5 MG PO TABS: 5.0000 mg | ORAL_TABLET | Freq: Every day | ORAL | 0 refills | Status: DC

## 2020-03-21 MED ORDER — LISINOPRIL 5 MG PO TABS: 5.0000 mg | ORAL_TABLET | Freq: Every day | ORAL | 0 refills | Status: DC

## 2020-03-21 MED ORDER — ATORVASTATIN CALCIUM 40 MG PO TABS: 40.0000 mg | ORAL_TABLET | Freq: Every day | ORAL | 0 refills | Status: DC

## 2020-03-21 NOTE — Addendum Note (Signed)
Addended by: Wilfred Lacy L on: 03/21/2020 12:51 PM   Modules accepted: Orders

## 2020-03-21 NOTE — Telephone Encounter (Signed)
appt scheduled 04/04/20 @ 11:30. Dm/cma

## 2020-03-24 ENCOUNTER — Other Ambulatory Visit: Payer: Self-pay | Admitting: Nurse Practitioner

## 2020-03-24 DIAGNOSIS — F411 Generalized anxiety disorder: Secondary | ICD-10-CM

## 2020-03-24 DIAGNOSIS — F331 Major depressive disorder, recurrent, moderate: Secondary | ICD-10-CM

## 2020-04-03 ENCOUNTER — Encounter: Payer: BC Managed Care – PPO | Admitting: Nurse Practitioner

## 2020-04-04 ENCOUNTER — Encounter: Payer: BC Managed Care – PPO | Admitting: Nurse Practitioner

## 2020-04-07 ENCOUNTER — Ambulatory Visit: Payer: BC Managed Care – PPO | Admitting: Rehabilitative and Restorative Service Providers"

## 2020-04-17 ENCOUNTER — Ambulatory Visit: Payer: BC Managed Care – PPO | Admitting: Physical Therapy

## 2020-04-20 ENCOUNTER — Telehealth: Payer: Self-pay | Admitting: Family Medicine

## 2020-04-20 DIAGNOSIS — M5416 Radiculopathy, lumbar region: Secondary | ICD-10-CM

## 2020-04-20 DIAGNOSIS — M545 Low back pain, unspecified: Secondary | ICD-10-CM

## 2020-04-20 MED ORDER — PREDNISONE 10 MG PO TABS: 30.0000 mg | ORAL_TABLET | Freq: Every day | ORAL | 0 refills | Status: DC

## 2020-04-20 NOTE — Telephone Encounter (Signed)
I did refill the prednisone.  However next step is MRI to further evaluate why you are having pain in your back and down your leg.  You should hear about scheduling MRI soon.  Please let me know if you do not hear.

## 2020-04-20 NOTE — Telephone Encounter (Signed)
Left pt a VM to call us back to discuss.

## 2020-04-20 NOTE — Telephone Encounter (Signed)
Patient called asking if Dr [name] would be willing to send in a refill of Prednisone. She said that this really helped her last time.  Please advise.

## 2020-04-24 ENCOUNTER — Telehealth: Payer: Self-pay | Admitting: Family Medicine

## 2020-04-24 ENCOUNTER — Encounter: Payer: Self-pay | Admitting: Family Medicine

## 2020-04-24 NOTE — Telephone Encounter (Signed)
Patient called stating that she was out of work all last week and today due to her back. She said that she is feeling much better and would like to go back to work tomorrow. Her job asked for a letter from Dr [name] stating that she can come back to work.  Can this be written for her?

## 2020-04-24 NOTE — Telephone Encounter (Signed)
Letter sent.

## 2020-04-24 NOTE — Telephone Encounter (Signed)
Letter sent via MyChart and also available at the front desk for pick-up if needed.  Dr. [name] would like for pt to be made aware that she is feeling better temporarily because of the prednisone which she will finish in the next 1-2 days and that her pain will likely come back once the prednisone gets out of her system.  Attempting to RTW may be premature as she is still on the prednisone.  Pt needs to be made aware that Dr. [name] has ordered an MRI of her lumbar spine and he would like her to proceed with this to get more information as to where her symptoms are coming from.  Pt advised to return call to office so we can go over these things with her and to determine if she is still interested in returning to work tomorrow.

## 2020-04-25 NOTE — Telephone Encounter (Signed)
Patient called and said that her job is asking that the letter state that she has been out of work from Monday, March 7th through today, March 15th. I gave her Dr Clovis Riley response. She said that she would like to return to work tomorrow.  She can get the letter off MyChart when it is ready.

## 2020-04-26 ENCOUNTER — Encounter: Payer: Self-pay | Admitting: Family Medicine

## 2020-04-26 NOTE — Telephone Encounter (Signed)
Letter written return to work on Monday the 21st

## 2020-04-26 NOTE — Telephone Encounter (Signed)
Pt called again, thinks she will be ready to go back to work on Monday.

## 2020-05-05 ENCOUNTER — Encounter: Payer: BC Managed Care – PPO | Admitting: Nurse Practitioner

## 2020-05-16 ENCOUNTER — Ambulatory Visit: Payer: BC Managed Care – PPO | Admitting: Physical Therapy

## 2020-06-02 ENCOUNTER — Other Ambulatory Visit: Payer: Self-pay | Admitting: Nurse Practitioner

## 2020-06-02 ENCOUNTER — Telehealth: Payer: Self-pay | Admitting: Family Medicine

## 2020-06-02 DIAGNOSIS — Z1231 Encounter for screening mammogram for malignant neoplasm of breast: Secondary | ICD-10-CM

## 2020-06-02 NOTE — Telephone Encounter (Signed)
Pt called stating she would like a note for work to extend her hours from 8 to 10.

## 2020-06-05 NOTE — Telephone Encounter (Signed)
Please clarify if she has any other restrictions such as days per week worked.  I recall that she does have some other restrictions in place.  Should these be continued?

## 2020-06-05 NOTE — Telephone Encounter (Signed)
Called pt to discuss her work note situation. Dr. [name] cleared her for full return to work on 05/01/20, so no knew work note is needed. Pt verbalized understanding and reports she "should be good then."

## 2020-06-15 ENCOUNTER — Encounter: Payer: BC Managed Care – PPO | Admitting: Nurse Practitioner

## 2020-06-16 NOTE — Progress Notes (Deleted)
   I, [name], LAT, ATC acting as a scribe for Brenda Leader, MD.  Brenda [name] is a 55 y.o. female who presents to [place] at [facility] today for L knee pain x . MOI: ? Pt was previously seen by Dr. [name] for L hip pain and lumbar radiculopathy on 02/24/20. Today, pt locates pain to  Radiates: L knee swelling: L knee mechanical symptoms: Aggravates: Treatments tried:  Dx imaging: 02/24/20 L-spine XR   Pertinent review of systems: ***  Relevant historical information: ***   Exam:  LMP 11/26/2013  General: Well Developed, well nourished, and in no acute distress.   MSK: ***    Lab and Radiology Results No results found for this or any previous visit (from the past 72 hour(s)). No results found.     Assessment and Plan: 55 y.o. female with ***   PDMP not reviewed this encounter. No orders of the defined types were placed in this encounter.  No orders of the defined types were placed in this encounter.    Discussed warning signs or symptoms. Please see discharge instructions. Patient expresses understanding.   ***

## 2020-06-19 ENCOUNTER — Ambulatory Visit: Payer: BC Managed Care – PPO | Admitting: Family Medicine

## 2020-06-29 ENCOUNTER — Ambulatory Visit: Payer: BC Managed Care – PPO | Admitting: Family Medicine

## 2020-06-29 NOTE — Progress Notes (Deleted)
   I, Wendy Poet, LAT, ATC, am serving as scribe for Dr. [name].  OPIE FANTON is a 55 y.o. female who presents to [place] at [facility] today for L knee pain and L hip pain f/u.  She was last seen by Dr. [name] on 02/24/20 for L hip pain f/u that had worsened and changed into radiating pain down her L LE.  She was prescribed prednisone and Lyrica.  Since her last visit, pt reports    Pertinent review of systems: ***  Relevant historical information: ***   Exam:  LMP 11/26/2013  General: Well Developed, well nourished, and in no acute distress.   MSK: ***    Lab and Radiology Results No results found for this or any previous visit (from the past 72 hour(s)). No results found.     Assessment and Plan: 55 y.o. female with ***   PDMP not reviewed this encounter. No orders of the defined types were placed in this encounter.  No orders of the defined types were placed in this encounter.    Discussed warning signs or symptoms. Please see discharge instructions. Patient expresses understanding.   ***

## 2020-07-04 ENCOUNTER — Other Ambulatory Visit: Payer: Self-pay | Admitting: Family Medicine

## 2020-07-06 ENCOUNTER — Inpatient Hospital Stay: Admission: RE | Admit: 2020-07-06 | Payer: BC Managed Care – PPO | Source: Ambulatory Visit

## 2020-07-06 DIAGNOSIS — Z1231 Encounter for screening mammogram for malignant neoplasm of breast: Secondary | ICD-10-CM

## 2020-07-13 ENCOUNTER — Encounter: Payer: BC Managed Care – PPO | Admitting: Nurse Practitioner
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
## 2020-07-18 ENCOUNTER — Other Ambulatory Visit: Payer: Self-pay

## 2020-07-18 ENCOUNTER — Ambulatory Visit: Payer: Self-pay

## 2020-07-18 ENCOUNTER — Ambulatory Visit (INDEPENDENT_AMBULATORY_CARE_PROVIDER_SITE_OTHER): Payer: BC Managed Care – PPO

## 2020-07-18 ENCOUNTER — Ambulatory Visit (INDEPENDENT_AMBULATORY_CARE_PROVIDER_SITE_OTHER): Payer: BC Managed Care – PPO | Admitting: Family Medicine

## 2020-07-18 VITALS — BP 130/82 | HR 91 | Ht 62.0 in | Wt 143.2 lb

## 2020-07-18 DIAGNOSIS — M25562 Pain in left knee: Secondary | ICD-10-CM

## 2020-07-18 MED ORDER — IBUPROFEN 800 MG PO TABS: 800.0000 mg | ORAL_TABLET | Freq: Three times a day (TID) | ORAL | 0 refills | Status: DC | PRN

## 2020-07-18 NOTE — Patient Instructions (Signed)
Thank you for coming in today.  Please get an Xray today before you leave  Please use Voltaren gel (Generic Diclofenac Gel) up to 4x daily for pain as needed.  This is available over-the-counter as both the name brand Voltaren gel and the generic diclofenac gel.  Use the ibuprofen sparingly.   Call or go to the ER if you develop a large red swollen joint with extreme pain or oozing puss.   Recheck in 6 weeks.   Let me know if you are not doing well.

## 2020-07-19 ENCOUNTER — Ambulatory Visit: Payer: BC Managed Care – PPO | Admitting: Family Medicine

## 2020-07-20 NOTE — Progress Notes (Signed)
Left knee x-ray shows no acute findings.  No fractures.  No severe arthritis

## 2020-07-21 ENCOUNTER — Telehealth: Payer: Self-pay | Admitting: Family Medicine

## 2020-07-21 NOTE — Telephone Encounter (Signed)
Patient is still having continued knee pain. She was seen on 07/18/2020 and given a cortisone injection. She has also been taking the 800mg  ibuprofen with no relief. Are there any other options?

## 2020-07-24 ENCOUNTER — Other Ambulatory Visit: Payer: Self-pay

## 2020-07-24 DIAGNOSIS — M25562 Pain in left knee: Secondary | ICD-10-CM

## 2020-07-24 NOTE — Telephone Encounter (Signed)
We can try physical therapy.  Would you like me to place a referral?

## 2020-07-24 NOTE — Telephone Encounter (Signed)
Spoke w/ pt and she would like to proceed to PT. PT referral has been placed to pt's preferred location, [facility] on [address].

## 2020-07-31 ENCOUNTER — Ambulatory Visit: Payer: BC Managed Care – PPO | Admitting: Family Medicine

## 2020-08-04 ENCOUNTER — Other Ambulatory Visit: Payer: Self-pay | Admitting: Nurse Practitioner

## 2020-08-04 DIAGNOSIS — E1165 Type 2 diabetes mellitus with hyperglycemia: Secondary | ICD-10-CM

## 2020-08-08 ENCOUNTER — Ambulatory Visit: Payer: BC Managed Care – PPO | Admitting: Physical Therapy

## 2020-08-15 NOTE — Telephone Encounter (Signed)
Chart supports Rx Refills sent in.

## 2020-08-29 ENCOUNTER — Ambulatory Visit: Payer: BC Managed Care – PPO | Admitting: Family Medicine

## 2020-08-29 NOTE — Progress Notes (Deleted)
   I, [name], LAT, ATC acting as a scribe for [name], MD.  Brenda [name] is a 55 y.o. female who presents to [place] at [facility] today for f/u L knee pain most likely cause by exacerbation of DJD. Pt was last seen by Dr. [name] on 07/18/20 and was given a L knee steroid injection and advised to use Voltaren gel. Pt was also advised to f/u w/ PCP. Today, pt reports  Dx imaging: 07/18/20 L knee XR 02/24/20 L-spine XR  Pertinent review of systems: ***  Relevant historical information: ***   Exam:  LMP 11/26/2013  General: Well Developed, well nourished, and in no acute distress.   MSK: ***    Lab and Radiology Results No results found for this or any previous visit (from the past 72 hour(s)). No results found.     Assessment and Plan: 55 y.o. female with ***   PDMP not reviewed this encounter. No orders of the defined types were placed in this encounter.  No orders of the defined types were placed in this encounter.    Discussed warning signs or symptoms. Please see discharge instructions. Patient expresses understanding.   ***

## 2020-09-26 ENCOUNTER — Other Ambulatory Visit: Payer: Self-pay | Admitting: Nurse Practitioner

## 2020-09-26 ENCOUNTER — Other Ambulatory Visit: Payer: Self-pay | Admitting: Family Medicine

## 2020-09-26 DIAGNOSIS — I10 Essential (primary) hypertension: Secondary | ICD-10-CM

## 2020-09-26 DIAGNOSIS — K219 Gastro-esophageal reflux disease without esophagitis: Secondary | ICD-10-CM

## 2020-09-26 DIAGNOSIS — F331 Major depressive disorder, recurrent, moderate: Secondary | ICD-10-CM

## 2020-09-26 DIAGNOSIS — E7800 Pure hypercholesterolemia, unspecified: Secondary | ICD-10-CM

## 2020-09-26 DIAGNOSIS — E1165 Type 2 diabetes mellitus with hyperglycemia: Secondary | ICD-10-CM

## 2020-09-26 DIAGNOSIS — F411 Generalized anxiety disorder: Secondary | ICD-10-CM

## 2020-10-09 ENCOUNTER — Other Ambulatory Visit: Payer: Self-pay | Admitting: Family Medicine

## 2020-10-09 ENCOUNTER — Telehealth: Payer: Self-pay

## 2020-10-09 DIAGNOSIS — E1165 Type 2 diabetes mellitus with hyperglycemia: Secondary | ICD-10-CM

## 2020-10-09 NOTE — Telephone Encounter (Signed)
Pt has CPE scheduled 01/15/21 with Baldo Ash. She needs refills for: NOVOLIN 70/30 FLEXPEN) (70-30) 100 UNIT/ML KwikPen    mirtazapine (REMERON) 7.5 MG tablet [id number]      Insulin Pen Needle (PEN NEEDLES) 32G X 4 MM    [facility] uses [facility] on [address]

## 2020-10-10 ENCOUNTER — Other Ambulatory Visit: Payer: Self-pay | Admitting: Nurse Practitioner

## 2020-10-10 DIAGNOSIS — Z1231 Encounter for screening mammogram for malignant neoplasm of breast: Secondary | ICD-10-CM

## 2020-10-11 MED ORDER — NOVOLIN 70/30 FLEXPEN (70-30) 100 UNIT/ML ~~LOC~~ SUPN: 5.0000 [IU] | PEN_INJECTOR | Freq: Two times a day (BID) | SUBCUTANEOUS | 1 refills | Status: DC

## 2020-10-11 MED ORDER — BD PEN NEEDLE NANO 2ND GEN 32G X 4 MM MISC
1 refills | Status: AC
Start: 2020-10-11 — End: ?

## 2020-10-11 NOTE — Telephone Encounter (Signed)
Pt reminded to keep upcoming appointment for additional refills.

## 2020-10-11 NOTE — Telephone Encounter (Signed)
Remeron was not in patient current med list, called to patient to confirm she is still taking them and she states she hasn't because she had no refills, please advise

## 2020-10-11 NOTE — Telephone Encounter (Signed)
Please review and advise. Thanks. Dm/cma  

## 2020-10-20 NOTE — Telephone Encounter (Signed)
Patient notified and verbalized understanding. 

## 2020-10-23 ENCOUNTER — Telehealth: Payer: Self-pay | Admitting: Family Medicine

## 2020-10-23 NOTE — Telephone Encounter (Signed)
Patient called stating that she is needing an extension on her FMLA. Brenda [name] will be sending new forms). She had to miss work on August 8th, 29th, 30th, and 31st. They are also requesting a letter for these missed days.  Please advise

## 2020-10-24 ENCOUNTER — Encounter: Payer: Self-pay | Admitting: Family Medicine

## 2020-10-24 NOTE — Telephone Encounter (Signed)
Letter written.  Copy of letter sent electronically and a copy has been printed.

## 2020-10-24 NOTE — Telephone Encounter (Signed)
Called pt, advised letter done and in [place]. Also advised we have not received any FMLA ppwk from [employer].

## 2020-10-27 DIAGNOSIS — Z1231 Encounter for screening mammogram for malignant neoplasm of breast: Secondary | ICD-10-CM

## 2020-10-31 ENCOUNTER — Other Ambulatory Visit: Payer: Self-pay | Admitting: Nurse Practitioner

## 2020-10-31 DIAGNOSIS — K219 Gastro-esophageal reflux disease without esophagitis: Secondary | ICD-10-CM

## 2020-11-01 ENCOUNTER — Other Ambulatory Visit: Payer: Self-pay | Admitting: Nurse Practitioner

## 2020-11-01 DIAGNOSIS — E1165 Type 2 diabetes mellitus with hyperglycemia: Secondary | ICD-10-CM

## 2020-11-01 DIAGNOSIS — I10 Essential (primary) hypertension: Secondary | ICD-10-CM

## 2020-11-01 DIAGNOSIS — F411 Generalized anxiety disorder: Secondary | ICD-10-CM

## 2020-11-01 DIAGNOSIS — F331 Major depressive disorder, recurrent, moderate: Secondary | ICD-10-CM

## 2020-11-01 DIAGNOSIS — E7800 Pure hypercholesterolemia, unspecified: Secondary | ICD-10-CM

## 2020-11-10 ENCOUNTER — Encounter: Payer: BC Managed Care – PPO | Admitting: Obstetrics

## 2020-11-13 ENCOUNTER — Telehealth: Payer: Self-pay | Admitting: Nurse Practitioner

## 2020-11-13 DIAGNOSIS — K219 Gastro-esophageal reflux disease without esophagitis: Secondary | ICD-10-CM

## 2020-11-13 DIAGNOSIS — E7800 Pure hypercholesterolemia, unspecified: Secondary | ICD-10-CM

## 2020-11-13 DIAGNOSIS — F411 Generalized anxiety disorder: Secondary | ICD-10-CM

## 2020-11-13 DIAGNOSIS — F331 Major depressive disorder, recurrent, moderate: Secondary | ICD-10-CM

## 2020-11-13 DIAGNOSIS — E1165 Type 2 diabetes mellitus with hyperglycemia: Secondary | ICD-10-CM

## 2020-11-13 DIAGNOSIS — I10 Essential (primary) hypertension: Secondary | ICD-10-CM

## 2020-11-13 MED ORDER — LISINOPRIL 5 MG PO TABS: 5.0000 mg | ORAL_TABLET | Freq: Every day | ORAL | 0 refills | Status: DC

## 2020-11-13 MED ORDER — PANTOPRAZOLE SODIUM 20 MG PO TBEC: 20.0000 mg | DELAYED_RELEASE_TABLET | Freq: Every day | ORAL | 0 refills | Status: DC

## 2020-11-13 MED ORDER — ATORVASTATIN CALCIUM 40 MG PO TABS: 40.0000 mg | ORAL_TABLET | Freq: Every day | ORAL | 0 refills | Status: DC

## 2020-11-13 MED ORDER — AMLODIPINE BESYLATE 5 MG PO TABS: 5.0000 mg | ORAL_TABLET | Freq: Every day | ORAL | 0 refills | Status: DC

## 2020-11-13 MED ORDER — VENLAFAXINE HCL ER 150 MG PO CP24
150.0000 mg | ORAL_CAPSULE | Freq: Every day | ORAL | 0 refills | Status: DC
Start: 2020-11-13 — End: 2020-11-22

## 2020-11-13 NOTE — Telephone Encounter (Signed)
What is the name of the medication?  Brenda XR (EFFEXOR-XR) 150 MG 24 hr capsule [[id number]]  atorvastatin (LIPITOR) 40 MG tablet [[id number]]  amLODipine (NORVASC) 5 MG tablet [[id number]]  pantoprazole (PROTONIX) 20 MG tablet [[id number]] lisinopril (ZESTRIL) 5 MG tablet [[id number]]  ibuprofen (ADVIL) 800 MG tablet [[id number]]   Have you contacted your pharmacy to request a refill? Pt wants refills on all of these, I informed her she would need to be seen, she has an OV scheduled on 11/22/20.  Which pharmacy would you like this sent to?  [id number] [address], [place] [address]  Phone:  [phone]  Fax:  [phone]  DEA #:  [id number]   Patient notified that their request is being sent to the clinical staff for review and that they should receive a call once it is complete. If they do not receive a call within 72 hours they can check with their pharmacy or our office.

## 2020-11-13 NOTE — Telephone Encounter (Signed)
Pt notified medication refills sent in to pharmacy with enough to last until appointment 11/22/20, no additional refills will be given if patient does not show for appointment.

## 2020-11-13 NOTE — Telephone Encounter (Signed)
Patient has not been seen since 12/27/19, do you want to refill medications until upcoming appointment next week?

## 2020-11-15 ENCOUNTER — Other Ambulatory Visit: Payer: Self-pay | Admitting: Nurse Practitioner

## 2020-11-15 DIAGNOSIS — E1165 Type 2 diabetes mellitus with hyperglycemia: Secondary | ICD-10-CM

## 2020-11-15 DIAGNOSIS — I10 Essential (primary) hypertension: Secondary | ICD-10-CM

## 2020-11-15 DIAGNOSIS — E7800 Pure hypercholesterolemia, unspecified: Secondary | ICD-10-CM

## 2020-11-15 DIAGNOSIS — K219 Gastro-esophageal reflux disease without esophagitis: Secondary | ICD-10-CM

## 2020-11-20 ENCOUNTER — Other Ambulatory Visit: Payer: Self-pay | Admitting: Nurse Practitioner

## 2020-11-20 DIAGNOSIS — F331 Major depressive disorder, recurrent, moderate: Secondary | ICD-10-CM

## 2020-11-20 DIAGNOSIS — F411 Generalized anxiety disorder: Secondary | ICD-10-CM

## 2020-11-21 ENCOUNTER — Other Ambulatory Visit: Payer: Self-pay

## 2020-11-22 ENCOUNTER — Encounter: Payer: Self-pay | Admitting: Nurse Practitioner

## 2020-11-22 ENCOUNTER — Ambulatory Visit (INDEPENDENT_AMBULATORY_CARE_PROVIDER_SITE_OTHER): Payer: BC Managed Care – PPO | Admitting: Nurse Practitioner

## 2020-11-22 VITALS — BP 130/80 | HR 88 | Temp 96.7°F | Wt 141.1 lb

## 2020-11-22 DIAGNOSIS — Z23 Encounter for immunization: Secondary | ICD-10-CM

## 2020-11-22 DIAGNOSIS — Z794 Long term (current) use of insulin: Secondary | ICD-10-CM

## 2020-11-22 DIAGNOSIS — E1165 Type 2 diabetes mellitus with hyperglycemia: Secondary | ICD-10-CM | POA: Diagnosis not present

## 2020-11-22 DIAGNOSIS — I10 Essential (primary) hypertension: Secondary | ICD-10-CM | POA: Diagnosis not present

## 2020-11-22 DIAGNOSIS — E7800 Pure hypercholesterolemia, unspecified: Secondary | ICD-10-CM

## 2020-11-22 DIAGNOSIS — F331 Major depressive disorder, recurrent, moderate: Secondary | ICD-10-CM

## 2020-11-22 DIAGNOSIS — F411 Generalized anxiety disorder: Secondary | ICD-10-CM | POA: Diagnosis not present

## 2020-11-22 LAB — COMPREHENSIVE METABOLIC PANEL
ALT: 19 U/L (ref 0–35)
AST: 15 U/L (ref 0–37)
Albumin: 4.5 g/dL (ref 3.5–5.2)
Alkaline Phosphatase: 85 U/L (ref 39–117)
BUN: 8 mg/dL (ref 6–23)
CO2: 28 mEq/L (ref 19–32)
Calcium: 9.8 mg/dL (ref 8.4–10.5)
Chloride: 100 mEq/L (ref 96–112)
Creatinine, Ser: 0.54 mg/dL (ref 0.40–1.20)
GFR: 102.27 mL/min (ref >60.00)
Glucose, Bld: 264 mg/dL — ABNORMAL HIGH (ref 70–99)
Potassium: 4.2 mEq/L (ref 3.5–5.1)
Sodium: 138 mEq/L (ref 135–145)
Total Bilirubin: 0.4 mg/dL (ref 0.2–1.2)
Total Protein: 7.1 g/dL (ref 6.0–8.3)

## 2020-11-22 LAB — MICROALBUMIN / CREATININE URINE RATIO
Creatinine,U: 119.6 mg/dL
Microalb Creat Ratio: 1.8 mg/g (ref 0.0–30.0)
Microalb, Ur: 2.1 mg/dL — ABNORMAL HIGH (ref 0.0–1.9)

## 2020-11-22 LAB — LDL CHOLESTEROL, DIRECT: Direct LDL: 99 mg/dL

## 2020-11-22 LAB — CBC
HCT: 36.3 % (ref 36.0–46.0)
Hemoglobin: 12 g/dL (ref 12.0–15.0)
MCHC: 33.1 g/dL (ref 30.0–36.0)
MCV: 79.3 fl (ref 78.0–100.0)
Platelets: 424 10*3/uL — ABNORMAL HIGH (ref 150.0–400.0)
RBC: 4.57 Mil/uL (ref 3.87–5.11)
RDW: 13 % (ref 11.5–15.5)
WBC: 7.6 10*3/uL (ref 4.0–10.5)

## 2020-11-22 LAB — HEMOGLOBIN A1C: Hgb A1c MFr Bld: 10.2 % — ABNORMAL HIGH (ref 4.6–6.5)

## 2020-11-22 MED ORDER — AMLODIPINE BESYLATE 5 MG PO TABS: 5.0000 mg | ORAL_TABLET | Freq: Every day | ORAL | 3 refills | Status: DC

## 2020-11-22 MED ORDER — ATORVASTATIN CALCIUM 40 MG PO TABS: 40.0000 mg | ORAL_TABLET | Freq: Every day | ORAL | 3 refills | Status: DC

## 2020-11-22 MED ORDER — VENLAFAXINE HCL ER 150 MG PO CP24: 150.0000 mg | ORAL_CAPSULE | Freq: Every day | ORAL | 3 refills | Status: DC

## 2020-11-22 MED ORDER — LISINOPRIL 5 MG PO TABS: 5.0000 mg | ORAL_TABLET | Freq: Every day | ORAL | 3 refills | Status: DC

## 2020-11-22 NOTE — Progress Notes (Addendum)
Subjective:  Patient ID: Brenda [name], female    DOB: 1965/08/25  Age: 55 y.o. MRN: [id number]  CC: Follow-up (Medication follow up for refills needed. )  HPI  Wt Readings from Last 3 Encounters:  11/22/20 141 lb 1.6 oz (64 kg)  07/18/20 143 lb 3.2 oz (65 kg)  02/24/20 146 lb 9.6 oz (66.5 kg)    Essential hypertension, benign BP at goal with amlodipine and lisinopril BP Readings from Last 3 Encounters:  11/22/20 130/80  07/18/20 130/82  02/24/20 138/88   Repeat CMP Maintain current doses Refill sent  DM (diabetes mellitus) ([place]) Home fasting glucose 125-300 Admits to non compliance with her diet. Has no exercise regimen at this time. Current use of novolin BID Declined to take metformin and glipizide due to possible side effects. Current use of atovastatin She agreed to referral to ophthalmology today. Normal Dm foot exam today  Repeat hgbA1c, urine microalbumin, and cmp Uncontrolled with hgbA1c at 10: she declined to take metformin due to hx of diarrhea. Increase novolin to 10units BID and add januvia 645m daily Advised about importance of diet compliance and daily exercise F/up in 357monthhe is to send glucose reading via mychart in 45m6monthnsider use of GLP-1 injection or SGLT-2?  Depression Improved and stable mood with effexor Declined need for psychology referral  Refill sent  [facility]/22[facility]2020 03/05/2019 09/28/2018  Decreased Interest 0 0 1  Down, Depressed, Hopeless 0 0 2  PHQ - 2 Score 0 0 3  Altered sleeping 0 0 3  Tired, decreased energy 1 0 2  Change in appetite 1 0 3  Feeling bad or failure about yourself  0 0 0  Trouble concentrating 0 0 1  Moving slowly or fidgety/restless 1 0 0  Suicidal thoughts 0 0 0  PHQ-9 Score 3 0 12  Difficult doing work/chores Not difficult at all - -  Some recent data might be hidden    GAD 7 : Generalized Anxiety Score 11/22/2020 03/05/2019 09/28/2018 06/01/2018  Nervous, Anxious, on Edge 1 0 2  3  Control/stop worrying 1 0 1 3  Worry too much - different things 1 0 1 3  Trouble relaxing _0 Restless 1 0 1 1  Easily annoyed or irritable 0 0 1 2  Afraid - awful might happen 0 0 1 1  Total GAD 7 Score _1 Anxiety Difficulty Not difficult at all - - -   Reviewed past Medical, Social and Family history today.  Outpatient Medications Prior to Visit  Medication Sig Dispense Refill   albuterol (PROVENTIL HFA;VENTOLIN HFA) 108 (90 Base) MCG/ACT inhaler Inhale 2 puffs into the lungs every 6 (six) hours as needed for wheezing or shortness of breath. 1 Inhaler 0   blood glucose meter kit and supplies KIT Dispense based on patient and insurance preference.  Use before breakfast and at bedtime. E11.4 1 each 1   estradiol (CLIMARA - DOSED IN MG/24 HR) 0.025 mg/24hr patch Place 1 patch (0.025 mg total) onto the skin once a week. Needs to schedule mammogram for additional refills 12 patch 0   fluticasone (FLONASE) 50 MCG/ACT nasal spray Place 2 sprays into both nostrils daily. 16 g 3   glucose blood test strip Use before breakfast and at bedtime. 100 each 1   Insulin Pen Needle (BD PEN NEEDLE NANO 2ND GEN) 32G X 4 MM MISC USE WITH INSULIN PEN AS DIRECTED 100 each 1  ondansetron (ZOFRAN) 4 MG tablet Take 1 tablet (4 mg total) by mouth every 8 (eight) hours as needed for nausea or vomiting. 30 tablet 0   pantoprazole (PROTONIX) 20 MG tablet Take 1 tablet (20 mg total) by mouth daily. No additional refills without office visit 10 tablet 0   pregabalin (LYRICA) 75 MG capsule Take 1 capsule (75 mg total) by mouth 2 (two) times daily as needed (nerve pain). 60 capsule 1   tiZANidine (ZANAFLEX) 4 MG tablet Take 1 tablet (4 mg total) by mouth every 6 (six) hours as needed for muscle spasms. 30 tablet 1   amLODipine (NORVASC) 5 MG tablet Take 1 tablet (5 mg total) by mouth daily. No additional refills without office visit 10 tablet 0   atorvastatin (LIPITOR) 40 MG tablet Take 1 tablet (40 mg  total) by mouth daily at 6 PM. No additional refills without office visit 10 tablet 0   ibuprofen (ADVIL) 800 MG tablet TAKE 1 TABLET BY MOUTH EVERY 8 HOURS AS NEEDED 30 tablet 0   insulin isophane & regular human KwikPen (NOVOLIN 70/30 KWIKPEN) (70-30) 100 UNIT/ML KwikPen Inject 5 Units into the skin 2 (two) times daily with a meal. 15 mL 1   lisinopril (ZESTRIL) 5 MG tablet Take 1 tablet (5 mg total) by mouth daily. No additional refills without office visit 10 tablet 0   venlafaxine XR (EFFEXOR-XR) 150 MG 24 hr capsule Take 1 capsule (150 mg total) by mouth daily with breakfast. 10 capsule 0   No facility-administered medications prior to visit.    ROS See HPI  Objective:  BP 130/80 (BP Location: Left Arm, Patient Position: Sitting, Cuff Size: Normal)   Pulse 88   Temp (!) 96.7 F (35.9 C) (Temporal)   Wt 141 lb 1.6 oz (64 kg)   LMP 11/26/2013   SpO2 97%   BMI 25.81 kg/m   Physical Exam Vitals reviewed.  Constitutional:      Appearance: She is obese.  Cardiovascular:     Rate and Rhythm: Normal rate and regular rhythm.     Pulses: Normal pulses.     Heart sounds: Normal heart sounds.  Pulmonary:     Effort: Pulmonary effort is normal.     Breath sounds: Normal breath sounds.  Musculoskeletal:        General: Normal range of motion.     Cervical back: Normal range of motion and neck supple.     Right lower leg: No edema.     Left lower leg: No edema.  Skin:    General: Skin is warm and dry.     Comments: Normal DM foot exam  Neurological:     Mental Status: She is alert and oriented to person, place, and time.  Psychiatric:        Mood and Affect: Mood normal.        Behavior: Behavior normal.        Thought Content: Thought content normal.   Assessment & Plan:  This visit occurred during the SARS-CoV-2 public health emergency.  Safety protocols were in place, including screening questions prior to the visit, additional usage of staff PPE, and extensive cleaning of  exam room while observing appropriate contact time as indicated for disinfecting solutions.   Brenda [name] was seen today for follow-up.  Diagnoses and all orders for this visit:  Essential hypertension, benign -     Comprehensive metabolic panel -     Ambulatory referral to Ophthalmology -  amLODipine (NORVASC) 5 MG tablet; Take 1 tablet (5 mg total) by mouth daily. -     lisinopril (ZESTRIL) 5 MG tablet; Take 1 tablet (5 mg total) by mouth daily. -     CBC  Type 2 diabetes mellitus with hyperglycemia, with long-term current use of insulin (HCC) -     Hemoglobin A1c -     Microalbumin / creatinine urine ratio -     Comprehensive metabolic panel -     Ambulatory referral to Ophthalmology -     atorvastatin (LIPITOR) 40 MG tablet; Take 1 tablet (40 mg total) by mouth daily at 6 PM. -     lisinopril (ZESTRIL) 5 MG tablet; Take 1 tablet (5 mg total) by mouth daily. -     insulin isophane & regular human KwikPen (NOVOLIN 70/30 KWIKPEN) (70-30) 100 UNIT/ML KwikPen; Inject 10 Units into the skin 2 (two) times daily with a meal. -     sitaGLIPtin (JANUVIA) 50 MG tablet; Take 1 tablet (50 mg total) by mouth daily.  HYPERCHOLESTEROLEMIA -     Direct LDL -     Comprehensive metabolic panel -     atorvastatin (LIPITOR) 40 MG tablet; Take 1 tablet (40 mg total) by mouth daily at 6 PM.  ANXIETY DISORDER, GENERALIZED -     venlafaxine XR (EFFEXOR-XR) 150 MG 24 hr capsule; Take 1 capsule (150 mg total) by mouth daily with breakfast.  Moderate episode of recurrent major depressive disorder (HCC) -     venlafaxine XR (EFFEXOR-XR) 150 MG 24 hr capsule; Take 1 capsule (150 mg total) by mouth daily with breakfast.  Flu vaccine need -     Flu Vaccine QUAD High Dose(Fluad)  Type 2 diabetes mellitus with hyperglycemia, without long-term current use of insulin (HCC)   Problem List Items Addressed This Visit       Cardiovascular and Mediastinum   Essential hypertension, benign - Primary    BP at  goal with amlodipine and lisinopril BP Readings from Last 3 Encounters:  11/22/20 130/80  07/18/20 130/82  02/24/20 138/88   Repeat CMP Maintain current doses Refill sent      Relevant Medications   amLODipine (NORVASC) 5 MG tablet   atorvastatin (LIPITOR) 40 MG tablet   lisinopril (ZESTRIL) 5 MG tablet   Other Relevant Orders   Comprehensive metabolic panel (Completed)   Ambulatory referral to Ophthalmology   CBC (Completed)     Endocrine   DM (diabetes mellitus) ([place])    Home fasting glucose 125-300 Admits to non compliance with her diet. Has no exercise regimen at this time. Current use of novolin BID Declined to take metformin and glipizide due to possible side effects. Current use of atovastatin She agreed to referral to ophthalmology today. Normal Dm foot exam today  Repeat hgbA1c, urine microalbumin, and cmp Uncontrolled with hgbA1c at 10: she declined to take metformin due to hx of diarrhea. Increase novolin to 10units BID and add [place] $RemoveB'25mg'bVCCwweQ$  daily Advised about importance of diet compliance and daily exercise F/up in 45month She is to send glucose reading via mychart in 64month Consider use of GLP-1 injection or SGLT-2?      Relevant Medications   atorvastatin (LIPITOR) 40 MG tablet   lisinopril (ZESTRIL) 5 MG tablet   insulin isophane & regular human KwikPen (NOVOLIN 70/30 KWIKPEN) (70-30) 100 UNIT/ML KwikPen   sitaGLIPtin (JANUVIA) 50 MG tablet   Other Relevant Orders   Hemoglobin A1c (Completed)   Microalbumin / creatinine urine ratio (Completed)  Comprehensive metabolic panel (Completed)   Ambulatory referral to Ophthalmology     Other   ANXIETY DISORDER, GENERALIZED   Relevant Medications   venlafaxine XR (EFFEXOR-XR) 150 MG 24 hr capsule   Depression    Improved and stable mood with effexor Declined need for psychology referral  Refill sent      Relevant Medications   venlafaxine XR (EFFEXOR-XR) 150 MG 24 hr capsule   Other Visit  Diagnoses     HYPERCHOLESTEROLEMIA       Relevant Medications   amLODipine (NORVASC) 5 MG tablet   atorvastatin (LIPITOR) 40 MG tablet   lisinopril (ZESTRIL) 5 MG tablet   Other Relevant Orders   Direct LDL (Completed)   Comprehensive metabolic panel (Completed)   Flu vaccine need       Relevant Orders   Flu Vaccine QUAD High Dose(Fluad) (Completed)       Follow-up: Return in about 2 months (around 01/22/2021) for maintain upcoming appt in December.  Wilfred Lacy, NP

## 2020-11-22 NOTE — Assessment & Plan Note (Signed)
Improved and stable mood with effexor Declined need for psychology referral  Refill sent

## 2020-11-22 NOTE — Assessment & Plan Note (Signed)
BP at goal with amlodipine and lisinopril BP Readings from Last 3 Encounters:  11/22/20 130/80  07/18/20 130/82  02/24/20 138/88   Repeat CMP Maintain current doses Refill sent

## 2020-11-22 NOTE — Assessment & Plan Note (Addendum)
Home fasting glucose 125-300 Admits to non compliance with her diet. Has no exercise regimen at this time. Current use of novolin BID Declined to take metformin and glipizide due to possible side effects. Current use of atovastatin She agreed to referral to ophthalmology today. Normal Dm foot exam today  Repeat hgbA1c, urine microalbumin, and cmp Uncontrolled with hgbA1c at 10: she declined to take metformin due to hx of diarrhea. Increase novolin to 10units BID and add [place] 25mg  daily Advised about importance of diet compliance and daily exercise F/up in 23month She is to send glucose reading via mychart in 62month Consider use of GLP-1 injection or SGLT-2?

## 2020-11-22 NOTE — Patient Instructions (Signed)
Maintain upcoming appt with GYN and for mammogram.  Go to lab for blood draw and urine collection. Will let you know about next appt after review of lab results.  You will be contacted to schedule appt with ophthalmology.  Diabetes Mellitus and Nutrition, Adult When you have diabetes, or diabetes mellitus, it is very important to have healthy eating habits because your blood sugar (glucose) levels are greatly affected by what you eat and drink. Eating healthy foods in the right amounts, at about the same times every day, can help you: Control your blood glucose. Lower your risk of heart disease. Improve your blood pressure. Reach or maintain a healthy weight. What can affect my meal plan? Every person with diabetes is different, and each person has different needs for a meal plan. Your health care provider may recommend that you work with a dietitian to make a meal plan that is best for you. Your meal plan may vary depending on factors such as: The calories you need. The medicines you take. Your weight. Your blood glucose, blood pressure, and cholesterol levels. Your activity level. Other health conditions you have, such as heart or kidney disease. How do carbohydrates affect me? Carbohydrates, also called carbs, affect your blood glucose level more than any other type of food. Eating carbs naturally raises the amount of glucose in your blood. Carb counting is a method for keeping track of how many carbs you eat. Counting carbs is important to keep your blood glucose at a healthy level, especially if you use insulin or take certain oral diabetes medicines. It is important to know how many carbs you can safely have in each meal. This is different for every person. Your dietitian can help you calculate how many carbs you should have at each meal and for each snack. How does alcohol affect me? Alcohol can cause a sudden decrease in blood glucose (hypoglycemia), especially if you use insulin or  take certain oral diabetes medicines. Hypoglycemia can be a life-threatening condition. Symptoms of hypoglycemia, such as sleepiness, dizziness, and confusion, are similar to symptoms of having too much alcohol. Do not drink alcohol if: Your health care provider tells you not to drink. You are pregnant, may be pregnant, or are planning to become pregnant. If you drink alcohol: Do not drink on an empty stomach. Limit how much you use to: 0-1 drink a day for women. 0-2 drinks a day for men. Be aware of how much alcohol is in your drink. In the [place] one drink equals one 12 oz bottle of beer (355 mL), one 5 oz glass of wine (148 mL), or one 1 oz glass of hard liquor (44 mL). Keep yourself hydrated with water, diet soda, or unsweetened iced tea. Keep in mind that regular soda, juice, and other mixers may contain a lot of sugar and must be counted as carbs. What are tips for following this plan? Reading food labels Start by checking the serving size on the "Nutrition Facts" label of packaged foods and drinks. The amount of calories, carbs, fats, and other nutrients listed on the label is based on one serving of the item. Many items contain more than one serving per package. Check the total grams (g) of carbs in one serving. You can calculate the number of servings of carbs in one serving by dividing the total carbs by 15. For example, if a food has 30 g of total carbs per serving, it would be equal to 2 servings of carbs. Check the  number of grams (g) of saturated fats and trans fats in one serving. Choose foods that have a low amount or none of these fats. Check the number of milligrams (mg) of salt (sodium) in one serving. Most people should limit total sodium intake to less than 2,300 mg per day. Always check the nutrition information of foods labeled as "low-fat" or "nonfat." These foods may be higher in added sugar or refined carbs and should be avoided. Talk to your dietitian to identify your  daily goals for nutrients listed on the label. Shopping Avoid buying canned, pre-made, or processed foods. These foods tend to be high in fat, sodium, and added sugar. Shop around the outside edge of the grocery store. This is where you will most often find fresh fruits and vegetables, bulk grains, fresh meats, and fresh dairy. Cooking Use low-heat cooking methods, such as baking, instead of high-heat cooking methods like deep frying. Cook using healthy oils, such as olive, canola, or sunflower oil. Avoid cooking with butter, cream, or high-fat meats. Meal planning Eat meals and snacks regularly, preferably at the same times every day. Avoid going long periods of time without eating. Eat foods that are high in fiber, such as fresh fruits, vegetables, beans, and whole grains. Talk with your dietitian about how many servings of carbs you can eat at each meal. Eat 4-6 oz (112-168 g) of lean protein each day, such as lean meat, chicken, fish, eggs, or tofu. One ounce (oz) of lean protein is equal to: 1 oz (28 g) of meat, chicken, or fish. 1 egg.  cup (62 g) of tofu. Eat some foods each day that contain healthy fats, such as avocado, nuts, seeds, and fish. What foods should I eat? Fruits Berries. Apples. Oranges. Peaches. Apricots. Plums. Grapes. Mango. Papaya. Pomegranate. Kiwi. Cherries. Vegetables Lettuce. Spinach. Leafy greens, including kale, chard, collard greens, and mustard greens. Beets. Cauliflower. Cabbage. Broccoli. Carrots. Green beans. Tomatoes. Peppers. Onions. Cucumbers. Brussels sprouts. Grains Whole grains, such as whole-wheat or whole-grain bread, crackers, tortillas, cereal, and pasta. Unsweetened oatmeal. Quinoa. Brown or wild rice. Meats and other proteins Seafood. Poultry without skin. Lean cuts of poultry and beef. Tofu. Nuts. Seeds. Dairy Low-fat or fat-free dairy products such as milk, yogurt, and cheese. The items listed above may not be a complete list of foods and  beverages you can eat. Contact a dietitian for more information. What foods should I avoid? Fruits Fruits canned with syrup. Vegetables Canned vegetables. Frozen vegetables with butter or cream sauce. Grains Refined white flour and flour products such as bread, pasta, snack foods, and cereals. Avoid all processed foods. Meats and other proteins Fatty cuts of meat. Poultry with skin. Breaded or fried meats. Processed meat. Avoid saturated fats. Dairy Full-fat yogurt, cheese, or milk. Beverages Sweetened drinks, such as soda or iced tea. The items listed above may not be a complete list of foods and beverages you should avoid. Contact a dietitian for more information. Questions to ask a health care provider Do I need to meet with a diabetes educator? Do I need to meet with a dietitian? What number can I call if I have questions? When are the best times to check my blood glucose? Where to find more information: American Diabetes Association: diabetes.org Academy of Nutrition and Dietetics: www.eatright.[employer] of Diabetes and Digestive and Kidney Diseases: [employer] Association of Diabetes Care and Education Specialists: www.diabeteseducator.org Summary It is important to have healthy eating habits because your blood sugar (glucose) levels are greatly  affected by what you eat and drink. A healthy meal plan will help you control your blood glucose and maintain a healthy lifestyle. Your health care provider may recommend that you work with a dietitian to make a meal plan that is best for you. Keep in mind that carbohydrates (carbs) and alcohol have immediate effects on your blood glucose levels. It is important to count carbs and to use alcohol carefully. This information is not intended to replace advice given to you by your health care provider. Make sure you discuss any questions you have with your health care provider. Document Revised: 01/05/2019 Document  Reviewed: 01/05/2019 Elsevier Patient Education  2021 [employer].

## 2020-11-25 ENCOUNTER — Other Ambulatory Visit: Payer: Self-pay | Admitting: Family Medicine

## 2020-11-27 ENCOUNTER — Telehealth: Payer: Self-pay | Admitting: Nurse Practitioner

## 2020-11-27 MED ORDER — SITAGLIPTIN PHOSPHATE 50 MG PO TABS: 50.0000 mg | ORAL_TABLET | Freq: Every day | ORAL | 1 refills | Status: DC

## 2020-11-27 MED ORDER — NOVOLIN 70/30 FLEXPEN (70-30) 100 UNIT/ML ~~LOC~~ SUPN: 10.0000 [IU] | PEN_INJECTOR | Freq: Two times a day (BID) | SUBCUTANEOUS | 0 refills | Status: DC

## 2020-11-27 NOTE — Addendum Note (Signed)
Addended by: Wilfred Lacy L on: 11/27/2020 03:27 PM   Modules accepted: Orders

## 2020-11-27 NOTE — Telephone Encounter (Signed)
Rx refill request approved per Dr. Corey's orders. 

## 2020-11-28 ENCOUNTER — Other Ambulatory Visit: Payer: Self-pay | Admitting: Nurse Practitioner

## 2020-11-28 DIAGNOSIS — E1165 Type 2 diabetes mellitus with hyperglycemia: Secondary | ICD-10-CM

## 2020-11-28 DIAGNOSIS — Z794 Long term (current) use of insulin: Secondary | ICD-10-CM

## 2020-11-28 MED ORDER — OZEMPIC (0.25 OR 0.5 MG/DOSE) 2 MG/1.5ML ~~LOC~~ SOPN: PEN_INJECTOR | SUBCUTANEOUS | 1 refills | Status: DC

## 2020-11-29 NOTE — Telephone Encounter (Signed)
New Rx sent.

## 2020-12-18 ENCOUNTER — Ambulatory Visit: Payer: BC Managed Care – PPO | Admitting: Nurse Practitioner

## 2020-12-19 ENCOUNTER — Other Ambulatory Visit: Payer: Self-pay | Admitting: Family Medicine

## 2020-12-19 DIAGNOSIS — E1165 Type 2 diabetes mellitus with hyperglycemia: Secondary | ICD-10-CM

## 2020-12-21 ENCOUNTER — Encounter: Payer: BC Managed Care – PPO | Admitting: Obstetrics

## 2020-12-29 ENCOUNTER — Encounter: Payer: Self-pay | Admitting: Nurse Practitioner

## 2020-12-29 ENCOUNTER — Telehealth (INDEPENDENT_AMBULATORY_CARE_PROVIDER_SITE_OTHER): Payer: BC Managed Care – PPO | Admitting: Nurse Practitioner

## 2020-12-29 DIAGNOSIS — R051 Acute cough: Secondary | ICD-10-CM | POA: Diagnosis not present

## 2020-12-29 DIAGNOSIS — J069 Acute upper respiratory infection, unspecified: Secondary | ICD-10-CM | POA: Diagnosis not present

## 2020-12-29 MED ORDER — FLUTICASONE PROPIONATE 50 MCG/ACT NA SUSP: 2.0000 | Freq: Every day | NASAL | 0 refills | Status: AC

## 2020-12-29 MED ORDER — HYDROCOD POLST-CPM POLST ER 10-8 MG/5ML PO SUER: 5.0000 mL | Freq: Every evening | ORAL | 0 refills | Status: DC | PRN

## 2020-12-29 NOTE — Assessment & Plan Note (Signed)
Symptoms do sound consistent with a viral upper respiratory infection may not be a bad idea if symptoms persist to recheck a COVID test.  Continue using over-the-counter treatments take cough medication as prescribed continue to monitor.

## 2020-12-29 NOTE — Assessment & Plan Note (Signed)
Patient is complaining of cough being her most burdensome symptom.  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  She has done well with hydrocodone in the past we will send her in a short low dose of hydrocodone cough syrup to help her rest at night.  Continue to monitor symptoms signs and symptoms reviewed as when to seek urgent or emergent health care.

## 2020-12-29 NOTE — Progress Notes (Signed)
Patient ID: Brenda [name], female    DOB: 1965/04/09, 55 y.o.   MRN: [id number]  Virtual visit completed through [place], a video enabled telemedicine application. Due to national recommendations of social distancing due to COVID-19, a virtual visit is felt to be most appropriate for this patient at this time. Reviewed limitations, risks, security and privacy concerns of performing a virtual visit and the availability of in person appointments. I also reviewed that there may be a patient responsible charge related to this service. The patient agreed to proceed.   Patient location: home Provider location: [place] [facility], office Persons participating in this virtual visit: patient, provider   If any vitals were documented, they were collected by patient at home unless specified below.    LMP 11/26/2013    CC: Cough Subjective:   HPI: Brenda [name] is a 55 y.o. female presenting on 12/29/2020 for Cough (Sx started on 12/27/20. Post nasal drip, tickle in the throat, headache, sneezing, body aches. No fever. Covid test negative.)  Symptoms on 12/27/2020 At home covid test today was negative Pfizer x2 , no boosters  No sick contacts Ibuprofen, mucinex, tea and lemon. Without great relief   Relevant past medical, surgical, family and social history reviewed and updated as indicated. Interim medical history since our last visit reviewed. Allergies and medications reviewed and updated. Outpatient Medications Prior to Visit  Medication Sig Dispense Refill   albuterol (PROVENTIL HFA;VENTOLIN HFA) 108 (90 Base) MCG/ACT inhaler Inhale 2 puffs into the lungs every 6 (six) hours as needed for wheezing or shortness of breath. 1 Inhaler 0   amLODipine (NORVASC) 5 MG tablet Take 1 tablet (5 mg total) by mouth daily. 90 tablet 3   atorvastatin (LIPITOR) 40 MG tablet Take 1 tablet (40 mg total) by mouth daily at 6 PM. 90 tablet 3   blood glucose meter kit and supplies KIT Dispense based  on patient and insurance preference.  Use before breakfast and at bedtime. E11.4 1 each 1   estradiol (CLIMARA - DOSED IN MG/24 HR) 0.025 mg/24hr patch Place 1 patch (0.025 mg total) onto the skin once a week. Needs to schedule mammogram for additional refills 12 patch 0   fluticasone (FLONASE) 50 MCG/ACT nasal spray Place 2 sprays into both nostrils daily. 16 g 3   ibuprofen (ADVIL) 800 MG tablet TAKE 1 TABLET BY MOUTH EVERY 8 HOURS AS NEEDED 30 tablet 0   insulin isophane & regular human KwikPen (NOVOLIN 70/30 KWIKPEN) (70-30) 100 UNIT/ML KwikPen Inject 10 Units into the skin 2 (two) times daily with a meal. 15 mL 0   Insulin Pen Needle (BD PEN NEEDLE NANO 2ND GEN) 32G X 4 MM MISC USE WITH INSULIN PEN AS DIRECTED 100 each 1   lisinopril (ZESTRIL) 5 MG tablet Take 1 tablet (5 mg total) by mouth daily. 90 tablet 3   ondansetron (ZOFRAN) 4 MG tablet Take 1 tablet (4 mg total) by mouth every 8 (eight) hours as needed for nausea or vomiting. 30 tablet 0   ONETOUCH VERIO test strip USE BEFORE BREAKFAST AND AT BEDTIME. 200 strip 2   pantoprazole (PROTONIX) 20 MG tablet Take 1 tablet (20 mg total) by mouth daily. No additional refills without office visit 10 tablet 0   tiZANidine (ZANAFLEX) 4 MG tablet Take 1 tablet (4 mg total) by mouth every 6 (six) hours as needed for muscle spasms. 30 tablet 1   venlafaxine XR (EFFEXOR-XR) 150 MG 24 hr capsule Take 1  capsule (150 mg total) by mouth daily with breakfast. 90 capsule 3   pregabalin (LYRICA) 75 MG capsule Take 1 capsule (75 mg total) by mouth 2 (two) times daily as needed (nerve pain). (Patient not taking: Reported on 12/29/2020) 60 capsule 1   Semaglutide,0.25 or 0.5MG/DOS, (OZEMPIC, 0.25 OR 0.5 MG/DOSE,) 2 MG/1.5ML SOPN 0.47m weekly x 2weeks, then 0.58mweekly continuously (Patient not taking: Reported on 12/29/2020) 1.5 mL 1   No facility-administered medications prior to visit.     Per HPI unless specifically indicated in ROS section below Review  of Systems  Constitutional:  Positive for fatigue. Negative for chills and fever.  HENT:  Positive for congestion, ear pain (ache) and sinus pressure. Negative for sore throat.   Respiratory:  Positive for cough. Negative for shortness of breath.   Cardiovascular:  Negative for chest pain.  Gastrointestinal:  Negative for abdominal pain, diarrhea, nausea and vomiting.  Musculoskeletal:  Negative for arthralgias and myalgias.  Neurological:  Positive for headaches.  Objective:  LMP 11/26/2013   Wt Readings from Last 3 Encounters:  11/22/20 141 lb 1.6 oz (64 kg)  07/18/20 143 lb 3.2 oz (65 kg)  02/24/20 146 lb 9.6 oz (66.5 kg)       Physical exam: Gen: alert, NAD, not ill appearing Pulm: speaks in complete sentences without increased work of breathing Psych: normal mood, normal thought content      Results for orders placed or performed in visit on 11/22/20  Direct LDL  Result Value Ref Range   Direct LDL 99.0 mg/dL  Hemoglobin A1c  Result Value Ref Range   Hgb A1c MFr Bld 10.2 Repeated and verified X2. (H) 4.6 - 6.5 %  Microalbumin / creatinine urine ratio  Result Value Ref Range   Microalb, Ur 2.1 (H) 0.0 - 1.9 mg/dL   Creatinine,U 119.6 mg/dL   Microalb Creat Ratio 1.8 0.0 - 30.0 mg/g  Comprehensive metabolic panel  Result Value Ref Range   Sodium 138 135 - 145 mEq/L   Potassium 4.2 3.5 - 5.1 mEq/L   Chloride 100 96 - 112 mEq/L   CO2 28 19 - 32 mEq/L   Glucose, Bld 264 (H) 70 - 99 mg/dL   BUN 8 6 - 23 mg/dL   Creatinine, Ser 0.54 0.40 - 1.20 mg/dL   Total Bilirubin 0.4 0.2 - 1.2 mg/dL   Alkaline Phosphatase 85 39 - 117 U/L   AST 15 0 - 37 U/L   ALT 19 0 - 35 U/L   Total Protein 7.1 6.0 - 8.3 g/dL   Albumin 4.5 3.5 - 5.2 g/dL   GFR 102.27 >60.00 mL/min   Calcium 9.8 8.4 - 10.5 mg/dL  CBC  Result Value Ref Range   WBC 7.6 4.0 - 10.5 K/uL   RBC 4.57 3.87 - 5.11 Mil/uL   Platelets 424.0 (H) 150.0 - 400.0 K/uL   Hemoglobin 12.0 12.0 - 15.0 g/dL   HCT 36.3 36.0 -  46.0 %   MCV 79.3 78.0 - 100.0 fl   MCHC 33.1 30.0 - 36.0 g/dL   RDW 13.0 11.5 - 15.5 %   Assessment & Plan:   Problem List Items Addressed This Visit       Respiratory   Acute URI    Symptoms do sound consistent with a viral upper respiratory infection may not be a bad idea if symptoms persist to recheck a COVID test.  Continue using over-the-counter treatments take cough medication as prescribed continue to monitor.  Relevant Medications   chlorpheniramine-HYDROcodone (TUSSIONEX PENNKINETIC ER) 10-8 MG/5ML SUER   fluticasone (FLONASE) 50 MCG/ACT nasal spray     Other   Acute cough - Primary    Patient is complaining of cough being her most burdensome symptom.  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  She has done well with hydrocodone in the past we will send her in a short low dose of hydrocodone cough syrup to help her rest at night.  Continue to monitor symptoms signs and symptoms reviewed as when to seek urgent or emergent health care.          No orders of the defined types were placed in this encounter.  No orders of the defined types were placed in this encounter.   I discussed the assessment and treatment plan with the patient. The patient was provided an opportunity to ask questions and all were answered. The patient agreed with the plan and demonstrated an understanding of the instructions. The patient was advised to call back or seek an in-person evaluation if the symptoms worsen or if the condition fails to improve as anticipated.  Follow up plan: No follow-ups on file.  Romilda Garret, NP

## 2021-01-01 ENCOUNTER — Telehealth: Payer: Self-pay | Admitting: Nurse Practitioner

## 2021-01-01 DIAGNOSIS — J0140 Acute pansinusitis, unspecified: Secondary | ICD-10-CM

## 2021-01-01 DIAGNOSIS — J209 Acute bronchitis, unspecified: Secondary | ICD-10-CM

## 2021-01-01 NOTE — Telephone Encounter (Signed)
Pt is calling back regarding previous message about cough and congestion  Thanks

## 2021-01-01 NOTE — Telephone Encounter (Signed)
Francie called in Call back [phone]  Requesting additional cough syrup. She is out. She notes using more than 13ml at night. Typically 2x-3x each day because of a bad cough. Pt has a hoarse/raspy throat. Pt also asking for antibiotic for green phlegm she is coughing up and blowing from her nose.   [id number] - [place], [place] [address]

## 2021-01-02 MED ORDER — AZITHROMYCIN 250 MG PO TABS: 250.0000 mg | ORAL_TABLET | Freq: Every day | ORAL | 0 refills | Status: DC

## 2021-01-02 MED ORDER — BENZONATATE 200 MG PO CAPS: 200.0000 mg | ORAL_CAPSULE | Freq: Three times a day (TID) | ORAL | 0 refills | Status: DC | PRN

## 2021-01-02 MED ORDER — ALBUTEROL SULFATE HFA 108 (90 BASE) MCG/ACT IN AERS: 1.0000 | INHALATION_SPRAY | Freq: Four times a day (QID) | RESPIRATORY_TRACT | 0 refills | Status: DC | PRN

## 2021-01-02 NOTE — Telephone Encounter (Signed)
No refill on tussionex. Sent benzonatate perles and albuterol for acute bronchitis. Sent azithromycin for possible bacterial sinus infection. It is also important to maintain adequate oral hydration, flonase and mucinex. If no improvement in 10days, call office for an appt.

## 2021-01-03 ENCOUNTER — Encounter: Payer: Self-pay | Admitting: Emergency Medicine

## 2021-01-03 ENCOUNTER — Ambulatory Visit
Admission: EM | Admit: 2021-01-03 | Discharge: 2021-01-03 | Disposition: A | Discharge status: Home / Self Care | Payer: BC Managed Care – PPO | Attending: Internal Medicine | Admitting: Internal Medicine

## 2021-01-03 ENCOUNTER — Telehealth: Payer: Self-pay | Admitting: Nurse Practitioner

## 2021-01-03 ENCOUNTER — Other Ambulatory Visit: Payer: Self-pay

## 2021-01-03 DIAGNOSIS — J208 Acute bronchitis due to other specified organisms: Secondary | ICD-10-CM

## 2021-01-03 DIAGNOSIS — U071 COVID-19: Secondary | ICD-10-CM

## 2021-01-03 MED ORDER — GUAIFENESIN 200 MG PO TABS: 200.0000 mg | ORAL_TABLET | ORAL | 0 refills | Status: DC | PRN

## 2021-01-03 NOTE — ED Triage Notes (Signed)
Patient c/o cough, congestion, runny nose, body sweating x 1 week.  Patient has taken Flonase, OTC cold meds.  Patient is vaccinated for COVID.

## 2021-01-03 NOTE — ED Provider Notes (Signed)
[facility]    CSN: [id number] Arrival date & time: 01/03/21  1444      History   Chief Complaint Chief Complaint  Patient presents with   Cough    HPI Brenda [name] is a 55 y.o. female.   Patient presents with nonproductive cough, nasal congestion, runny nose, body sweats that have been present for 1 week.  Denies any known fever, chest pain, shortness of breath, nausea, vomiting, diarrhea, abdominal pain.  Patient took an at home COVID-19 test yesterday that was positive.  Patient has taken taking Flonase.  Patient was seen by video visit on 12/29/2020 and prescribed hydrocodone cough medication.  Patient had a telephone visit on 01/01/2021 and was prescribed azithromycin antibiotic, benzonatate Perles, and albuterol inhaler.  Patient reports that she has been taking the Perles but no other medications that were prescribed.   Cough  Past Medical History:  Diagnosis Date   Allergic rhinitis, cause unspecified    Anemia, unspecified    Diabetes mellitus without complication ([place])    Esophageal reflux    Generalized anxiety disorder    Headache(784.0)    Hypertension    Pure hypercholesterolemia    Rash and other nonspecific skin eruption    Unspecified vitamin D deficiency     Patient Active Problem List   Diagnosis Date Noted   Acute cough 12/29/2020   Hepatic cyst 05/12/2018   Routine general medical examination at a health care facility 07/16/2016   Right knee pain 12/26/2015   DM (diabetes mellitus) ([place]) 10/17/2015   Acute URI 05/16/2014   Depression 05/16/2014   Essential hypertension, benign 03/08/2013   DJD (degenerative joint disease) 06/16/2012   Hyperlipidemia LDL goal <100 08/18/2008   ANXIETY DISORDER, GENERALIZED 12/11/2006   ALLERGIC RHINITIS 12/11/2006   GERD 12/11/2006    Past Surgical History:  Procedure Laterality Date   TENDON REPAIR  1990   left index finger    OB History   No obstetric history on file.      Home  Medications    Prior to Admission medications   Medication Sig Start Date End Date Taking? Authorizing Provider  albuterol (VENTOLIN HFA) 108 (90 Base) MCG/ACT inhaler Inhale 1-2 puffs into the lungs every 6 (six) hours as needed for wheezing or shortness of breath. 01/02/21  Yes Nche, Charlene Brooke, NP  amLODipine (NORVASC) 5 MG tablet Take 1 tablet (5 mg total) by mouth daily. 11/22/20  Yes Nche, Charlene Brooke, NP  atorvastatin (LIPITOR) 40 MG tablet Take 1 tablet (40 mg total) by mouth daily at 6 PM. 11/22/20  Yes Nche, Charlene Brooke, NP  blood glucose meter kit and supplies KIT Dispense based on patient and insurance preference.  Use before breakfast and at bedtime. E11.4 09/30/16  Yes Nche, Charlene Brooke, NP  estradiol (CLIMARA - DOSED IN MG/24 HR) 0.025 mg/24hr patch Place 1 patch (0.025 mg total) onto the skin once a week. Needs to schedule mammogram for additional refills 12/28/18  Yes Nche, Charlene Brooke, NP  fluticasone (FLONASE) 50 MCG/ACT nasal spray Place 2 sprays into both nostrils daily. 12/29/20  Yes Michela Pitcher, NP  guaiFENesin 200 MG tablet Take 1 tablet (200 mg total) by mouth every 4 (four) hours as needed for cough or to loosen phlegm. 01/03/21  Yes Wendelin Reader, Hildred Alamin E, FNP  ibuprofen (ADVIL) 800 MG tablet TAKE 1 TABLET BY MOUTH EVERY 8 HOURS AS NEEDED 11/27/20  Yes Gregor Hams, MD  insulin isophane & regular human KwikPen (NOVOLIN  70/30 KWIKPEN) (70-30) 100 UNIT/ML KwikPen Inject 10 Units into the skin 2 (two) times daily with a meal. 11/27/20  Yes Nche, Charlene Brooke, NP  Insulin Pen Needle (BD PEN NEEDLE NANO 2ND GEN) 32G X 4 MM MISC USE WITH INSULIN PEN AS DIRECTED 10/11/20  Yes Nche, Charlene Brooke, NP  lisinopril (ZESTRIL) 5 MG tablet Take 1 tablet (5 mg total) by mouth daily. 11/22/20  Yes Nche, Charlene Brooke, NP  ondansetron (ZOFRAN) 4 MG tablet Take 1 tablet (4 mg total) by mouth every 8 (eight) hours as needed for nausea or vomiting. 07/01/19  Yes Nche, Charlene Brooke, NP   ONETOUCH VERIO test strip USE BEFORE BREAKFAST AND AT BEDTIME. 12/20/20  Yes Nche, Charlene Brooke, NP  pantoprazole (PROTONIX) 20 MG tablet Take 1 tablet (20 mg total) by mouth daily. No additional refills without office visit 11/13/20  Yes Nche, Charlene Brooke, NP  tiZANidine (ZANAFLEX) 4 MG tablet Take 1 tablet (4 mg total) by mouth every 6 (six) hours as needed for muscle spasms. 11/17/19  Yes Gregor Hams, MD  venlafaxine XR (EFFEXOR-XR) 150 MG 24 hr capsule Take 1 capsule (150 mg total) by mouth daily with breakfast. 11/22/20  Yes Nche, Charlene Brooke, NP  azithromycin (ZITHROMAX Z-PAK) 250 MG tablet Take 1 tablet (250 mg total) by mouth daily. Take 2tabs on first day, then 1tab once a day till complete 01/02/21   Nche, Charlene Brooke, NP  benzonatate (TESSALON) 200 MG capsule Take 1 capsule (200 mg total) by mouth 3 (three) times daily as needed. 01/02/21   Nche, Charlene Brooke, NP  pregabalin (LYRICA) 75 MG capsule Take 1 capsule (75 mg total) by mouth 2 (two) times daily as needed (nerve pain). Patient not taking: Reported on 12/29/2020 02/24/20   Gregor Hams, MD  Semaglutide,0.25 or 0.5MG/DOS, (OZEMPIC, 0.25 OR 0.5 MG/DOSE,) 2 MG/1.5ML SOPN 0.69m weekly x 2weeks, then 0.527mweekly continuously Patient not taking: Reported on 12/29/2020 11/28/20   Nche, ChCharlene BrookeNP  omeprazole (PRILOSEC OTC) 20 MG tablet Take 1 tablet (20 mg total) by mouth daily. 02/15/11 07/01/19  NaNoralee SpaceMD    Family History Family History  Problem Relation Age of Onset   Heart disease Maternal Grandfather    Asthma Mother    Diabetes Mother    Arthritis Mother    Alcohol abuse Mother    Asthma Sister    Diabetes Sister    Allergies Sister     Social History Social History   Tobacco Use   Smoking status: Never   Smokeless tobacco: Never  Vaping Use   Vaping Use: Never used  Substance Use Topics   Alcohol use: Yes    Comment: occasionally   Drug use: No     Allergies   Codeine  phosphate   Review of Systems Review of Systems Per HPI  Physical Exam Triage Vital Signs ED Triage Vitals  Enc Vitals Group     BP 01/03/21 1703 (!) 154/85     Pulse Rate 01/03/21 1703 91     Resp 01/03/21 1703 20     Temp 01/03/21 1703 98.2 F (36.8 C)     Temp Source 01/03/21 1703 Oral     SpO2 01/03/21 1703 99 %     Weight 01/03/21 1704 141 lb (64 kg)     Height 01/03/21 1704 5' 2"  (1.575 m)     Head Circumference --      Peak Flow --      Pain  Score 01/03/21 1704 5     Pain Loc --      Pain Edu? --      Excl. in [place]? --    No data found.  Updated Vital Signs BP (!) 154/85 (BP Location: Left Arm)   Pulse 91   Temp 98.2 F (36.8 C) (Oral)   Resp 20   Ht 5' 2"  (1.575 m)   Wt 141 lb (64 kg)   LMP 11/26/2013   SpO2 99%   BMI 25.79 kg/m   Visual Acuity Right Eye Distance:   Left Eye Distance:   Bilateral Distance:    Right Eye Near:   Left Eye Near:    Bilateral Near:     Physical Exam Constitutional:      General: She is not in acute distress.    Appearance: Normal appearance. She is not toxic-appearing or diaphoretic.  HENT:     Head: Normocephalic and atraumatic.     Right Ear: Tympanic membrane and ear canal normal.     Left Ear: Tympanic membrane and ear canal normal.     Nose: Congestion present.     Mouth/Throat:     Mouth: Mucous membranes are moist.     Pharynx: No posterior oropharyngeal erythema.  Eyes:     Extraocular Movements: Extraocular movements intact.     Conjunctiva/sclera: Conjunctivae normal.     Pupils: Pupils are equal, round, and reactive to light.  Cardiovascular:     Rate and Rhythm: Normal rate and regular rhythm.     Pulses: Normal pulses.     Heart sounds: Normal heart sounds.  Pulmonary:     Effort: Pulmonary effort is normal. No respiratory distress.     Breath sounds: Normal breath sounds. No stridor. No wheezing, rhonchi or rales.  Abdominal:     General: Abdomen is flat. Bowel sounds are normal.      Palpations: Abdomen is soft.  Musculoskeletal:        General: Normal range of motion.     Cervical back: Normal range of motion.  Skin:    General: Skin is warm and dry.  Neurological:     General: No focal deficit present.     Mental Status: She is alert and oriented to person, place, and time. Mental status is at baseline.  Psychiatric:        Mood and Affect: Mood normal.        Behavior: Behavior normal.     UC Treatments / Results  Labs (all labs ordered are listed, but only abnormal results are displayed) Labs Reviewed  NOVEL CORONAVIRUS, NAA    EKG   Radiology No results found.  Procedures Procedures (including critical care time)  Medications Ordered in UC Medications - No data to display  Initial Impression / Assessment and Plan / UC Course  I have reviewed the triage vital signs and the nursing notes.  Pertinent labs & imaging results that were available during my care of the patient were reviewed by me and considered in my medical decision making (see chart for details).     Patient requesting retesting for COVID-19.  COVID-19 PCR pending.  Patient is outside of the treatment window for antiviral medication for COVID.  Limited options on symptom management for patient given patient's history of high blood pressure and diabetes.  Unable to prescribe prednisone steroid due to diabetes.  Will prescribe guaifenesin to help alleviate cough.  Advised patient to pick up other prescribed medications that were prescribed by  PCP.  Suggested chest x-ray but patient declined.  No red flags on exam.  Discussed strict return precautions.  Patient verbalized understanding and was agreeable with plan. Final Clinical Impressions(s) / UC Diagnoses   Final diagnoses:  [id number]     Discharge Instructions      Please pick up azithromycin antibiotic prescribed by other healthcare provider from pharmacy.  You have also been prescribed a different cough medication to take as  needed.  Follow-up with primary care if symptoms persist.    ED Prescriptions     Medication Sig Dispense Auth. Provider   guaiFENesin 200 MG tablet Take 1 tablet (200 mg total) by mouth every 4 (four) hours as needed for cough or to loosen phlegm. 30 suppository [place], Michele Rockers, [place]      PDMP not reviewed this encounter.   Teodora Medici, [place] 01/03/21 1806

## 2021-01-03 NOTE — Discharge Instructions (Signed)
Please pick up azithromycin antibiotic prescribed by other healthcare provider from pharmacy.  You have also been prescribed a different cough medication to take as needed.  Follow-up with primary care if symptoms persist.

## 2021-01-04 LAB — SARS-COV-2, NAA 2 DAY TAT

## 2021-01-04 LAB — NOVEL CORONAVIRUS, NAA: SARS-CoV-2, NAA: DETECTED — AB

## 2021-01-08 ENCOUNTER — Telehealth: Payer: Self-pay | Admitting: Emergency Medicine

## 2021-01-08 MED ORDER — PROMETHAZINE-DM 6.25-15 MG/5ML PO SYRP: 5.0000 mL | ORAL_SOLUTION | Freq: Three times a day (TID) | ORAL | 0 refills | Status: DC | PRN

## 2021-01-08 NOTE — Telephone Encounter (Signed)
Promethazine Dm sent in place of benzonatate. Work note written

## 2021-01-08 NOTE — Telephone Encounter (Signed)
Spoke with patient, states that her PCP took care of her return to work note.  Will follow up prn.

## 2021-01-08 NOTE — Telephone Encounter (Signed)
Pt states she has tried medication and she is still having symptoms. Pt states her job is requesting her to come back to work on 01/09/21 but she does not feel like she will be able to work because she is still having diarrhea, congestion, and fatigue. Pt would like to know if she could get a work note for the reminder of this week? Please advise

## 2021-01-08 NOTE — Telephone Encounter (Signed)
Patient was seen on 01/03/2021, PCR swab for COVID done, came back positive on 01/05/2021.  Patient is requesting a note for work for the remainder of this week.  Patient is still very fatigued and has a lot of congestion/productive cough.  Advised that we are only allowed to give 3 days max from UC.  Patient is requesting a note from today, tomorrow and Wednesday at least.  Please advise.  [phone].

## 2021-01-10 NOTE — Telephone Encounter (Signed)
Chart supports rx refill Last ov: 11/22/2020 Last refill: 11/13/2020

## 2021-01-11 NOTE — Telephone Encounter (Signed)
Patient notified and verbalized understanding. 

## 2021-01-15 ENCOUNTER — Ambulatory Visit (INDEPENDENT_AMBULATORY_CARE_PROVIDER_SITE_OTHER): Payer: BC Managed Care – PPO | Admitting: Nurse Practitioner

## 2021-01-15 ENCOUNTER — Ambulatory Visit: Payer: BC Managed Care – PPO | Admitting: Nurse Practitioner

## 2021-01-15 ENCOUNTER — Encounter: Payer: Self-pay | Admitting: Nurse Practitioner

## 2021-01-15 ENCOUNTER — Telehealth: Payer: Self-pay | Admitting: Nurse Practitioner

## 2021-01-15 ENCOUNTER — Other Ambulatory Visit: Payer: Self-pay

## 2021-01-15 VITALS — BP 116/72 | HR 77 | Temp 97.0°F | Resp 16 | Ht 62.0 in | Wt 140.4 lb

## 2021-01-15 DIAGNOSIS — Z794 Long term (current) use of insulin: Secondary | ICD-10-CM | POA: Diagnosis not present

## 2021-01-15 DIAGNOSIS — Z0001 Encounter for general adult medical examination with abnormal findings: Secondary | ICD-10-CM | POA: Diagnosis not present

## 2021-01-15 DIAGNOSIS — E785 Hyperlipidemia, unspecified: Secondary | ICD-10-CM

## 2021-01-15 DIAGNOSIS — Z1211 Encounter for screening for malignant neoplasm of colon: Secondary | ICD-10-CM | POA: Diagnosis not present

## 2021-01-15 DIAGNOSIS — Z23 Encounter for immunization: Secondary | ICD-10-CM

## 2021-01-15 DIAGNOSIS — E1165 Type 2 diabetes mellitus with hyperglycemia: Secondary | ICD-10-CM | POA: Diagnosis not present

## 2021-01-15 DIAGNOSIS — I10 Essential (primary) hypertension: Secondary | ICD-10-CM | POA: Diagnosis not present

## 2021-01-15 MED ORDER — DAPAGLIFLOZIN PROPANEDIOL 5 MG PO TABS: 5.0000 mg | ORAL_TABLET | Freq: Every day | ORAL | 5 refills | Status: DC

## 2021-01-15 NOTE — Assessment & Plan Note (Addendum)
Uncontrolled with novolin BID, declined to use metformin due to potential side effects (diarrhea) Unable to afford januvia and ozempic (high copay) Fasting Glucose at home: 200-300 Admits to high sugar diet Exercise: walking daily.  Advised to schedule appt for annual eye exam and dental cleaning Start farxiga Maintain low sugar /carb diet Agreed to nutritionist referral Schedule lab appt for fasting blood draw in 54month: BMP and lipid panel

## 2021-01-15 NOTE — Patient Instructions (Addendum)
Inquire from your insurance if [place] or [place] or saxenda is covered.  You will be contacted to schedule appt with nutritionist and GI Schedule appt with GYN, dentist and ophthalmology.  Schedule lab appt for fasting blood draw in 78month  Start farxiga as discussed.  Diabetes Mellitus and Nutrition, Adult When you have diabetes, or diabetes mellitus, it is very important to have healthy eating habits because your blood sugar (glucose) levels are greatly affected by what you eat and drink. Eating healthy foods in the right amounts, at about the same times every day, can help you: Manage your blood glucose. Lower your risk of heart disease. Improve your blood pressure. Reach or maintain a healthy weight. What can affect my meal plan? Every person with diabetes is different, and each person has different needs for a meal plan. Your health care provider may recommend that you work with a dietitian to make a meal plan that is best for you. Your meal plan may vary depending on factors such as: The calories you need. The medicines you take. Your weight. Your blood glucose, blood pressure, and cholesterol levels. Your activity level. Other health conditions you have, such as heart or kidney disease. How do carbohydrates affect me? Carbohydrates, also called carbs, affect your blood glucose level more than any other type of food. Eating carbs raises the amount of glucose in your blood. It is important to know how many carbs you can safely have in each meal. This is different for every person. Your dietitian can help you calculate how many carbs you should have at each meal and for each snack. How does alcohol affect me? Alcohol can cause a decrease in blood glucose (hypoglycemia), especially if you use insulin or take certain diabetes medicines by mouth. Hypoglycemia can be a life-threatening condition. Symptoms of hypoglycemia, such as sleepiness, dizziness, and confusion, are similar to  symptoms of having too much alcohol. Do not drink alcohol if: Your health care provider tells you not to drink. You are pregnant, may be pregnant, or are planning to become pregnant. If you drink alcohol: Limit how much you have to: 0-1 drink a day for women. 0-2 drinks a day for men. Know how much alcohol is in your drink. In the [place] one drink equals one 12 oz bottle of beer (355 mL), one 5 oz glass of wine (148 mL), or one 1 oz glass of hard liquor (44 mL). Keep yourself hydrated with water, diet soda, or unsweetened iced tea. Keep in mind that regular soda, juice, and other mixers may contain a lot of sugar and must be counted as carbs. What are tips for following this plan? Reading food labels Start by checking the serving size on the Nutrition Facts label of packaged foods and drinks. The number of calories and the amount of carbs, fats, and other nutrients listed on the label are based on one serving of the item. Many items contain more than one serving per package. Check the total grams (g) of carbs in one serving. Check the number of grams of saturated fats and trans fats in one serving. Choose foods that have a low amount or none of these fats. Check the number of milligrams (mg) of salt (sodium) in one serving. Most people should limit total sodium intake to less than 2,300 mg per day. Always check the nutrition information of foods labeled as "low-fat" or "nonfat." These foods may be higher in added sugar or refined carbs and should be avoided. Talk to  your dietitian to identify your daily goals for nutrients listed on the label. Shopping Avoid buying canned, pre-made, or processed foods. These foods tend to be high in fat, sodium, and added sugar. Shop around the outside edge of the grocery store. This is where you will most often find fresh fruits and vegetables, bulk grains, fresh meats, and fresh dairy products. Cooking Use low-heat cooking methods, such as baking, instead of  high-heat cooking methods, such as deep frying. Cook using healthy oils, such as olive, canola, or sunflower oil. Avoid cooking with butter, cream, or high-fat meats. Meal planning Eat meals and snacks regularly, preferably at the same times every day. Avoid going long periods of time without eating. Eat foods that are high in fiber, such as fresh fruits, vegetables, beans, and whole grains. Eat 4-6 oz (112-168 g) of lean protein each day, such as lean meat, chicken, fish, eggs, or tofu. One ounce (oz) (28 g) of lean protein is equal to: 1 oz (28 g) of meat, chicken, or fish. 1 egg.  cup (62 g) of tofu. Eat some foods each day that contain healthy fats, such as avocado, nuts, seeds, and fish. What foods should I eat? Fruits Berries. Apples. Oranges. Peaches. Apricots. Plums. Grapes. Mangoes. Papayas. Pomegranates. Kiwi. Cherries. Vegetables Leafy greens, including lettuce, spinach, kale, chard, collard greens, mustard greens, and cabbage. Beets. Cauliflower. Broccoli. Carrots. Green beans. Tomatoes. Peppers. Onions. Cucumbers. Brussels sprouts. Grains Whole grains, such as whole-wheat or whole-grain bread, crackers, tortillas, cereal, and pasta. Unsweetened oatmeal. Quinoa. Brown or wild rice. Meats and other proteins Seafood. Poultry without skin. Lean cuts of poultry and beef. Tofu. Nuts. Seeds. Dairy Low-fat or fat-free dairy products such as milk, yogurt, and cheese. The items listed above may not be a complete list of foods and beverages you can eat and drink. Contact a dietitian for more information. What foods should I avoid? Fruits Fruits canned with syrup. Vegetables Canned vegetables. Frozen vegetables with butter or cream sauce. Grains Refined white flour and flour products such as bread, pasta, snack foods, and cereals. Avoid all processed foods. Meats and other proteins Fatty cuts of meat. Poultry with skin. Breaded or fried meats. Processed meat. Avoid saturated  fats. Dairy Full-fat yogurt, cheese, or milk. Beverages Sweetened drinks, such as soda or iced tea. The items listed above may not be a complete list of foods and beverages you should avoid. Contact a dietitian for more information. Questions to ask a health care provider Do I need to meet with a certified diabetes care and education specialist? Do I need to meet with a dietitian? What number can I call if I have questions? When are the best times to check my blood glucose? Where to find more information: American Diabetes Association: diabetes.org Academy of Nutrition and Dietetics: eatright.[employer] of Diabetes and Digestive and Kidney Diseases: [employer] Association of Diabetes Care & Education Specialists: diabeteseducator.org Summary It is important to have healthy eating habits because your blood sugar (glucose) levels are greatly affected by what you eat and drink. It is important to use alcohol carefully. A healthy meal plan will help you manage your blood glucose and lower your risk of heart disease. Your health care provider may recommend that you work with a dietitian to make a meal plan that is best for you. This information is not intended to replace advice given to you by your health care provider. Make sure you discuss any questions you have with your health care provider. Document Revised: 09/01/2019  Document Reviewed: 09/01/2019 Elsevier Patient Education  [place].

## 2021-01-15 NOTE — Assessment & Plan Note (Addendum)
BP at goal with amlodipine and lisinopril BP Readings from Last 3 Encounters:  01/15/21 116/72  01/03/21 (!) 154/85  11/22/20 130/80   Maintain dose Refills sent

## 2021-01-15 NOTE — Telephone Encounter (Signed)
Pt requesting that her work note say that she was out Fri - Monday.. 12/2-12/5... She said she discussed this with Baldo Ash when she was here today. The one she has is only for 12/5.

## 2021-01-15 NOTE — Progress Notes (Signed)
Subjective:    Patient ID: Brenda [name], female    DOB: [birth year], 55 y.o.   MRN: [id number]  Patient presents today for CPE and eval of chronic conditions  HPI DM (diabetes mellitus) ([place]) Uncontrolled with novolin BID, declined to use metformin due to potential side effects (diarrhea) Unable to afford [place] and ozempic (high copay) Fasting Glucose at home: 200-300 Admits to high sugar diet Exercise: walking daily.  Advised to schedule appt for annual eye exam and dental cleaning Start farxiga Maintain low sugar /carb diet Agreed to nutritionist referral Schedule lab appt for fasting blood draw in 60month BMP and lipid panel  Essential hypertension, benign BP at goal with amlodipine and lisinopril BP Readings from Last 3 Encounters:  01/15/21 116/72  01/03/21 (!) 154/85  11/22/20 130/80   Maintain dose Refills sent  Vision:has upcoming appt Dental:will schedule Diet:regular Exercise:walking daily Weight:  Wt Readings from Last 3 Encounters:  01/15/21 140 lb 6.4 oz (63.7 kg)  01/03/21 141 lb (64 kg)  11/22/20 141 lb 1.6 oz (64 kg)    Sexual History (orientation,birth control, marital status, STD):will schedule PAP with PAP  Depression/Suicide: [facility] 01/15/2021 11/22/2020 03/05/2019 09/28/2018 06/01/2018 07/15/2016 10/21/2013  Decreased Interest 1 0 0 1 3 2  0  Down, Depressed, Hopeless 0 0 0 2 2 1 1   PHQ - 2 Score 1 0 0 3 5 3 1   Altered sleeping 0 0 0 3 3 1  -  Tired, decreased energy 1 1 0 2 1 2  -  Change in appetite 0 1 0 3 2 1  -  Feeling bad or failure about yourself  0 0 0 0 2 2 -  Trouble concentrating 0 0 0 1 1 1  -  Moving slowly or fidgety/restless 0 1 0 0 0 2 -  Suicidal thoughts 0 0 0 0 0 1 -  PHQ-9 Score 2 3 0 12 14 13  -  Difficult doing work/chores Not difficult at all Not difficult at all - - - - -  Some recent data might be hidden   Immunizations: (TDAP, Hep C screen, Pneumovax, Influenza, zoster)  Health Maintenance  Topic  Date Due   Eye exam for diabetics  Never done   HIV Screening  Never done   Pap Smear  Never done   Mammogram  Never done   Zoster (Shingles) Vaccine (1 of 2) Never done   Colon Cancer Screening  02/11/2018   COVID-19 Vaccine (3 - Booster for Pfizer series) 11/02/2019   Hemoglobin A1C  05/23/2021   Complete foot exam   11/22/2021   Tetanus Vaccine  09/14/2027   Pneumococcal Vaccination  Completed   Flu Shot  Completed   Hepatitis C Screening: USPSTF Recommendation to screen - Ages 18-79 yo.  Completed   HPV Vaccine  Aged Out   Fall Risk: Fall Risk  01/15/2021 03/05/2019 06/01/2018  Falls in the past year? 0 0 0  Number falls in past yr: 0 - -  Injury with Fall? 0 - -   Medications and allergies reviewed with patient and updated if appropriate.  Patient Active Problem List   Diagnosis Date Noted   Acute cough 12/29/2020   Hepatic cyst 05/12/2018   Right knee pain 12/26/2015   DM [place]abetes mellitus) ([place] 10/17/2015   Depression 05/16/2014   Essential hypertension, benign 03/08/2013   DJD (degenerative joint disease) 06/16/2012   Hyperlipidemia LDL goal <100 08/18/2008   ANXIETY DISORDER, GENERALIZED 12/11/2006   ALLERGIC RHINITIS 12/11/2006  GERD 12/11/2006    Current Outpatient Medications on File Prior to Visit  Medication Sig Dispense Refill   albuterol (VENTOLIN HFA) 108 (90 Base) MCG/ACT inhaler Inhale 1-2 puffs into the lungs every 6 (six) hours as needed for wheezing or shortness of breath. 6.7 g 0   amLODipine (NORVASC) 5 MG tablet Take 1 tablet (5 mg total) by mouth daily. 90 tablet 3   atorvastatin (LIPITOR) 40 MG tablet Take 1 tablet (40 mg total) by mouth daily at 6 PM. 90 tablet 3   benzonatate (TESSALON) 200 MG capsule Take 1 capsule (200 mg total) by mouth 3 (three) times daily as needed. 30 capsule 0   blood glucose meter kit and supplies KIT Dispense based on patient and insurance preference.  Use before breakfast and at bedtime. E11.4 1 each 1    estradiol (CLIMARA - DOSED IN MG/24 HR) 0.025 mg/24hr patch Place 1 patch (0.025 mg total) onto the skin once a week. Needs to schedule mammogram for additional refills 12 patch 0   fluticasone (FLONASE) 50 MCG/ACT nasal spray Place 2 sprays into both nostrils daily. 16 g 0   guaiFENesin 200 MG tablet Take 1 tablet (200 mg total) by mouth every 4 (four) hours as needed for cough or to loosen phlegm. 30 suppository 0   ibuprofen (ADVIL) 800 MG tablet TAKE 1 TABLET BY MOUTH EVERY 8 HOURS AS NEEDED 30 tablet 0   insulin isophane & regular human KwikPen (NOVOLIN 70/30 KWIKPEN) (70-30) 100 UNIT/ML KwikPen Inject 10 Units into the skin 2 (two) times daily with a meal. 15 mL 0   Insulin Pen Needle (BD PEN NEEDLE NANO 2ND GEN) 32G X 4 MM MISC USE WITH INSULIN PEN AS DIRECTED 100 each 1   lisinopril (ZESTRIL) 5 MG tablet Take 1 tablet (5 mg total) by mouth daily. 90 tablet 3   ondansetron (ZOFRAN) 4 MG tablet Take 1 tablet (4 mg total) by mouth every 8 (eight) hours as needed for nausea or vomiting. 30 tablet 0   pantoprazole (PROTONIX) 20 MG tablet TAKE 1 TABLET (20 MG TOTAL) BY MOUTH DAILY. NO ADDITIONAL REFILLS WITHOUT OFFICE VISIT 30 tablet 0   pantoprazole (PROTONIX) 20 MG tablet Take 1 tablet (20 mg total) by mouth daily. No additional refills without office visit 10 tablet 0   pregabalin (LYRICA) 75 MG capsule Take 1 capsule (75 mg total) by mouth 2 (two) times daily as needed (nerve pain). 60 capsule 1   promethazine-dextromethorphan (PROMETHAZINE-DM) 6.25-15 MG/5ML syrup Take 5 mLs by mouth 3 (three) times daily as needed for cough. 240 mL 0   tiZANidine (ZANAFLEX) 4 MG tablet Take 1 tablet (4 mg total) by mouth every 6 (six) hours as needed for muscle spasms. 30 tablet 1   venlafaxine XR (EFFEXOR-XR) 150 MG 24 hr capsule Take 1 capsule (150 mg total) by mouth daily with breakfast. 90 capsule 3   ONETOUCH VERIO test strip USE BEFORE BREAKFAST AND AT BEDTIME. 200 strip 2   [DISCONTINUED] omeprazole  (PRILOSEC OTC) 20 MG tablet Take 1 tablet (20 mg total) by mouth daily. 30 tablet 11   No current facility-administered medications on file prior to visit.    Past Medical History:  Diagnosis Date   Allergic rhinitis, cause unspecified    Anemia, unspecified    Diabetes mellitus without complication (HCC)    Esophageal reflux    Generalized anxiety disorder    Headache(784.0)    Hypertension    Pure hypercholesterolemia    Rash  and other nonspecific skin eruption    Unspecified vitamin D deficiency     Past Surgical History:  Procedure Laterality Date   TENDON REPAIR  1990   left index finger    Social History   Socioeconomic History   Marital status: Single    Spouse name: Not on file   Number of children: Not on file   Years of education: Not on file   Highest education level: Not on file  Occupational History   Occupation: security guard  Tobacco Use   Smoking status: Never   Smokeless tobacco: Never  Vaping Use   Vaping Use: Never used  Substance and Sexual Activity   Alcohol use: Yes    Comment: occasionally   Drug use: No   Sexual activity: Not Currently  Other Topics Concern   Not on file  Social History Narrative   Not on file   Social Determinants of Health   Financial Resource Strain: Not on file  Food Insecurity: Not on file  Transportation Needs: Not on file  Physical Activity: Not on file  Stress: Not on file  Social Connections: Not on file    Family History  Problem Relation Age of Onset   Heart disease Maternal Grandfather    Asthma Mother    Diabetes Mother    Arthritis Mother    Alcohol abuse Mother    Asthma Sister    Diabetes Sister    Allergies Sister        Review of Systems  Constitutional:  Negative for fever, malaise/fatigue and weight loss.  HENT:  Negative for congestion and sore throat.   Eyes:        Negative for visual changes  Respiratory:  Negative for cough and shortness of breath.   Cardiovascular:   Negative for chest pain, palpitations and leg swelling.  Gastrointestinal:  Negative for blood in stool, constipation, diarrhea and heartburn.  Genitourinary:  Negative for dysuria, frequency and urgency.  Musculoskeletal:  Negative for falls, joint pain and myalgias.  Skin:  Negative for rash.  Neurological:  Negative for dizziness, sensory change and headaches.  Endo/Heme/Allergies:  Does not bruise/bleed easily.  Psychiatric/Behavioral:  Negative for depression, substance abuse and suicidal ideas. The patient is not nervous/anxious and does not have insomnia.    Objective:   Vitals:   01/15/21 0930  BP: 116/72  Pulse: 77  Resp: 16  Temp: (!) 97 F (36.1 C)  SpO2: 98%   Body mass index is 25.68 kg/m.  Physical Examination:  Physical Exam Vitals reviewed.  Constitutional:      General: She is not in acute distress. HENT:     Right Ear: Tympanic membrane, ear canal and external ear normal.     Left Ear: Tympanic membrane, ear canal and external ear normal.  Eyes:     General: No scleral icterus.    Extraocular Movements: Extraocular movements intact.     Conjunctiva/sclera: Conjunctivae normal.  Cardiovascular:     Rate and Rhythm: Normal rate and regular rhythm.     Pulses: Normal pulses.     Heart sounds: Normal heart sounds.  Pulmonary:     Effort: Pulmonary effort is normal. No respiratory distress.     Breath sounds: Normal breath sounds.  Abdominal:     General: Bowel sounds are normal. There is no distension.     Palpations: Abdomen is soft.  Genitourinary:    Comments: Deferred breast and pelvic exam to GYN Musculoskeletal:  General: Normal range of motion.     Cervical back: Normal range of motion and neck supple.     Right lower leg: No edema.     Left lower leg: No edema.  Lymphadenopathy:     Cervical: No cervical adenopathy.  Skin:    General: Skin is warm and dry.  Neurological:     Mental Status: She is alert and oriented to person,  place, and time.  Psychiatric:        Mood and Affect: Mood normal.        Behavior: Behavior normal.        Thought Content: Thought content normal.    ASSESSMENT and PLAN: This visit occurred during the SARS-CoV-2 public health emergency.  Safety protocols were in place, including screening questions prior to the visit, additional usage of staff PPE, and extensive cleaning of exam room while observing appropriate contact time as indicated for disinfecting solutions.   Brenda [name] was seen today for annual exam.  Diagnoses and all orders for this visit:  Encounter for preventative adult health care exam with abnormal findings -     Ambulatory referral to Gastroenterology  Type 2 diabetes mellitus with hyperglycemia, with long-term current use of insulin ([place]) -     Basic metabolic panel; Future -     Referral to Nutrition and Diabetes Services -     dapagliflozin propanediol (FARXIGA) 5 MG TABS tablet; Take 1 tablet (5 mg total) by mouth daily before breakfast.  Essential hypertension, benign -     Basic metabolic panel; Future  Hyperlipidemia LDL goal <100 -     Lipid panel; Future -     TSH; Future  Colon cancer screening -     Ambulatory referral to Gastroenterology  Need for prophylactic vaccination against Streptococcus pneumoniae (pneumococcus) -     Pneumococcal conjugate vaccine 20-valent (Prevnar 20)    inquire from your insurance if [place] or [place] or sax[place] is covered. You will be contacted to schedule appt with nutritionist and GI Schedule appt with GYN, dentist and ophthalmology. Schedule lab appt for fasting blood draw in 81monthStart farxiga as discussed.  Problem List Items Addressed This Visit       Cardiovascular and Mediastinum   Essential hypertension, benign    BP at goal with amlodipine and lisinopril BP Readings from Last 3 Encounters:  01/15/21 116/72  01/03/21 (!) 154/85  11/22/20 130/80   Maintain dose Refills sent      Relevant  Orders   Basic metabolic panel     Endocrine   DM (diabetes mellitus) ([place]    Uncontrolled with novolin BID, declined to use metformin due to potential side effects (diarrhea) [place] (high copay) Fasting Glucose at home: 200-300 Admits to high sugar diet Exercise: walking daily.  Advised to schedule appt for annual eye exam and dental cleaning Start farxiga Maintain low sugar /carb diet Agreed to nutritionist referral Schedule lab appt for fasting blood draw in 130monthBMP and lipid panel      Relevant Medications   dapagliflozin propanediol (FARXIGA) 5 MG TABS tablet   Other Relevant Orders   Basic metabolic panel   Referral to Nutrition and Diabetes Services     Other   Hyperlipidemia LDL goal <100   Relevant Orders   Lipid panel   TSH   Other Visit Diagnoses     Encounter for preventative adult health care exam with abnormal findings    -  Primary  Relevant Orders   Ambulatory referral to Gastroenterology   Colon cancer screening       Relevant Orders   Ambulatory referral to Gastroenterology   Need for prophylactic vaccination against Streptococcus pneumoniae (pneumococcus)       Relevant Orders   Pneumococcal conjugate vaccine 20-valent (Prevnar 20) (Completed)       Follow up: Return in about 3 months (around 04/15/2021) for DM and HTN, hyperlipidemia (fasting).  Wilfred Lacy, NP

## 2021-01-17 ENCOUNTER — Other Ambulatory Visit: Payer: Self-pay

## 2021-01-17 ENCOUNTER — Encounter (HOSPITAL_BASED_OUTPATIENT_CLINIC_OR_DEPARTMENT_OTHER): Payer: Self-pay

## 2021-01-17 ENCOUNTER — Emergency Department (HOSPITAL_BASED_OUTPATIENT_CLINIC_OR_DEPARTMENT_OTHER): Payer: BC Managed Care – PPO

## 2021-01-17 ENCOUNTER — Emergency Department (HOSPITAL_BASED_OUTPATIENT_CLINIC_OR_DEPARTMENT_OTHER)
Admission: EM | Admit: 2021-01-17 | Discharge: 2021-01-18 | Disposition: A | Discharge status: Home / Self Care | Payer: BC Managed Care – PPO | Attending: Emergency Medicine | Admitting: Emergency Medicine

## 2021-01-17 DIAGNOSIS — I10 Essential (primary) hypertension: Secondary | ICD-10-CM | POA: Diagnosis not present

## 2021-01-17 DIAGNOSIS — K529 Noninfective gastroenteritis and colitis, unspecified: Secondary | ICD-10-CM | POA: Insufficient documentation

## 2021-01-17 DIAGNOSIS — K219 Gastro-esophageal reflux disease without esophagitis: Secondary | ICD-10-CM | POA: Diagnosis not present

## 2021-01-17 DIAGNOSIS — R1031 Right lower quadrant pain: Secondary | ICD-10-CM | POA: Diagnosis present

## 2021-01-17 DIAGNOSIS — Z79899 Other long term (current) drug therapy: Secondary | ICD-10-CM | POA: Diagnosis not present

## 2021-01-17 DIAGNOSIS — Z7951 Long term (current) use of inhaled steroids: Secondary | ICD-10-CM | POA: Insufficient documentation

## 2021-01-17 DIAGNOSIS — Z794 Long term (current) use of insulin: Secondary | ICD-10-CM | POA: Insufficient documentation

## 2021-01-17 DIAGNOSIS — E119 Type 2 diabetes mellitus without complications: Secondary | ICD-10-CM | POA: Diagnosis not present

## 2021-01-17 LAB — CBC
HCT: 36.8 % (ref 36.0–46.0)
Hemoglobin: 12.1 g/dL (ref 12.0–15.0)
MCH: 26.4 pg (ref 26.0–34.0)
MCHC: 32.9 g/dL (ref 30.0–36.0)
MCV: 80.2 fL (ref 80.0–100.0)
Platelets: 442 10*3/uL — ABNORMAL HIGH (ref 150–400)
RBC: 4.59 MIL/uL (ref 3.87–5.11)
RDW: 12.7 % (ref 11.5–15.5)
WBC: 11.1 10*3/uL — ABNORMAL HIGH (ref 4.0–10.5)
nRBC: 0 % (ref 0.0–0.2)

## 2021-01-17 LAB — URINALYSIS, ROUTINE W REFLEX MICROSCOPIC
Bilirubin Urine: NEGATIVE
Glucose, UA: >=500 mg/dL — AB
Ketones, ur: NEGATIVE mg/dL
Leukocytes,Ua: NEGATIVE
Nitrite: NEGATIVE
Protein, ur: NEGATIVE mg/dL
Specific Gravity, Urine: 1.02 (ref 1.005–1.030)
pH: 5.5 (ref 5.0–8.0)

## 2021-01-17 LAB — COMPREHENSIVE METABOLIC PANEL
ALT: 15 U/L (ref 0–44)
AST: 13 U/L — ABNORMAL LOW (ref 15–41)
Albumin: 4.3 g/dL (ref 3.5–5.0)
Alkaline Phosphatase: 74 U/L (ref 38–126)
Anion gap: 12 (ref 5–15)
BUN: 9 mg/dL (ref 6–20)
CO2: 22 mmol/L (ref 22–32)
Calcium: 9 mg/dL (ref 8.9–10.3)
Chloride: 97 mmol/L — ABNORMAL LOW (ref 98–111)
Creatinine, Ser: 0.66 mg/dL (ref 0.44–1.00)
GFR, Estimated: >60 mL/min (ref >60)
Glucose, Bld: 364 mg/dL — ABNORMAL HIGH (ref 70–99)
Potassium: 3.7 mmol/L (ref 3.5–5.1)
Sodium: 131 mmol/L — ABNORMAL LOW (ref 135–145)
Total Bilirubin: 0.5 mg/dL (ref 0.3–1.2)
Total Protein: 7.9 g/dL (ref 6.5–8.1)

## 2021-01-17 LAB — URINALYSIS, MICROSCOPIC (REFLEX): WBC, UA: NONE SEEN WBC/hpf (ref 0–5)

## 2021-01-17 LAB — OCCULT BLOOD X 1 CARD TO LAB, STOOL: Fecal Occult Bld: POSITIVE — AB

## 2021-01-17 LAB — LIPASE, BLOOD: Lipase: 30 U/L (ref 11–51)

## 2021-01-17 LAB — PREGNANCY, URINE: Preg Test, Ur: NEGATIVE

## 2021-01-17 MED ORDER — SODIUM CHLORIDE 0.9 % IV BOLUS
1000.0000 mL | Freq: Once | INTRAVENOUS | Status: AC
  Administered 2021-01-17: 1000 mL via INTRAVENOUS

## 2021-01-17 MED ORDER — IOHEXOL 300 MG/ML  SOLN
100.0000 mL | Freq: Once | INTRAMUSCULAR | Status: AC | PRN
  Administered 2021-01-17: 100 mL via INTRAVENOUS

## 2021-01-17 MED ORDER — ONDANSETRON HCL 4 MG PO TABS: 4.0000 mg | ORAL_TABLET | Freq: Four times a day (QID) | ORAL | 0 refills | Status: DC

## 2021-01-17 MED ORDER — AMOXICILLIN-POT CLAVULANATE 875-125 MG PO TABS: 1.0000 | ORAL_TABLET | Freq: Two times a day (BID) | ORAL | 0 refills | Status: DC

## 2021-01-17 NOTE — Telephone Encounter (Signed)
Patient notified and verbalized understanding. Pt currently at hospital during time of call and states she will follow up with our office.

## 2021-01-17 NOTE — Discharge Instructions (Addendum)
You were diagnosed today with colitis.  We suspect this is infectious in nature and I Minna send you home with a 1 week course of Augmentin that you will take twice daily.  I also sent you home with some Zofran in case you feel nauseous.  Please follow-up with your primary care doctor within 1 week to evaluate response to treatment.  If he suddenly started developing worsening fevers, worsening diarrhea, bleeding and abdominal pain please return to the ED for reevaluation.  Feel better soon!

## 2021-01-17 NOTE — Telephone Encounter (Signed)
Pt asking for work note to be extended to 12/8 or 12/9. She said post covid effects of diarrhea that is bubbly and has blood in it.   Please Call back # [phone] Note via Mychart would be fine

## 2021-01-17 NOTE — ED Provider Notes (Addendum)
[facility] DEPARTMENT Provider Note   CSN: [id number] Arrival date & time: 01/17/21  1532     History Chief Complaint  Patient presents with   Abdominal Pain    Brenda [name] is a 55 y.o. female with history significant for diabetes, hypertension, hyperlipidemia presents today for evaluation of abdominal pain with hematochezia that started last night.  She states that she has had several episodes of diarrhea in the last 24 hours, and has noted some dark blood clots in her stool.  She also endorses some diffuse abdominal pain described as cramping.  She has been feeling a little nauseous without vomiting.  She has no prior GI history.  She has been taking Tylenol as needed for discomfort.  Otherwise she has not been taking any thing for symptoms.  She denies fevers, chest pain, rectal pain, pain during defecation aside from the cramping stomach pain, urinary symptoms, vaginal pain and bleeding.  Of note, patient is on insulin 3 times daily, stating her blood sugars usually run between 100-200.  It is 364 today.  Abdominal Pain Associated symptoms: diarrhea and nausea   Associated symptoms: no fever, no shortness of breath, no vaginal bleeding, no vaginal discharge and no vomiting       Past Medical History:  Diagnosis Date   Allergic rhinitis, cause unspecified    Anemia, unspecified    Diabetes mellitus without complication (HCC)    Esophageal reflux    Generalized anxiety disorder    Headache(784.0)    Hypertension    Pure hypercholesterolemia    Rash and other nonspecific skin eruption    Unspecified vitamin D deficiency     Patient Active Problem List   Diagnosis Date Noted   Acute cough 12/29/2020   Hepatic cyst 05/12/2018   Right knee pain 12/26/2015   DM (diabetes mellitus) ([place]) 10/17/2015   Depression 05/16/2014   Essential hypertension, benign 03/08/2013   DJD (degenerative joint disease) 06/16/2012   Hyperlipidemia LDL goal <100  08/18/2008   ANXIETY DISORDER, GENERALIZED 12/11/2006   ALLERGIC RHINITIS 12/11/2006   GERD 12/11/2006    Past Surgical History:  Procedure Laterality Date   TENDON REPAIR  1990   left index finger     OB History   No obstetric history on file.     Family History  Problem Relation Age of Onset   Heart disease Maternal Grandfather    Asthma Mother    Diabetes Mother    Arthritis Mother    Alcohol abuse Mother    Asthma Sister    Diabetes Sister    Allergies Sister     Social History   Tobacco Use   Smoking status: Never   Smokeless tobacco: Never  Vaping Use   Vaping Use: Never used  Substance Use Topics   Alcohol use: Yes    Comment: occasionally   Drug use: No    Home Medications Prior to Admission medications   Medication Sig Start Date End Date Taking? Authorizing Provider  amoxicillin-clavulanate (AUGMENTIN) 875-125 MG tablet Take 1 tablet by mouth every 12 (twelve) hours. 01/17/21  Yes Kathe Becton R, PA-C  ondansetron (ZOFRAN) 4 MG tablet Take 1 tablet (4 mg total) by mouth every 6 (six) hours. 01/17/21  Yes Kathe Becton R, PA-C  albuterol (VENTOLIN HFA) 108 (90 Base) MCG/ACT inhaler Inhale 1-2 puffs into the lungs every 6 (six) hours as needed for wheezing or shortness of breath. 01/02/21   Nche, Charlene Brooke, NP  amLODipine (NORVASC) 5  MG tablet Take 1 tablet (5 mg total) by mouth daily. 11/22/20   Nche, Charlene Brooke, NP  atorvastatin (LIPITOR) 40 MG tablet Take 1 tablet (40 mg total) by mouth daily at 6 PM. 11/22/20   Nche, Charlene Brooke, NP  benzonatate (TESSALON) 200 MG capsule Take 1 capsule (200 mg total) by mouth 3 (three) times daily as needed. 01/02/21   Nche, Charlene Brooke, NP  blood glucose meter kit and supplies KIT Dispense based on patient and insurance preference.  Use before breakfast and at bedtime. E11.4 09/30/16   Nche, Charlene Brooke, NP  dapagliflozin propanediol (FARXIGA) 5 MG TABS tablet Take 1 tablet (5 mg total) by mouth daily  before breakfast. 01/15/21   Nche, Charlene Brooke, NP  estradiol (CLIMARA - DOSED IN MG/24 HR) 0.025 mg/24hr patch Place 1 patch (0.025 mg total) onto the skin once a week. Needs to schedule mammogram for additional refills 12/28/18   Nche, Charlene Brooke, NP  fluticasone (FLONASE) 50 MCG/ACT nasal spray Place 2 sprays into both nostrils daily. 12/29/20   Michela Pitcher, NP  guaiFENesin 200 MG tablet Take 1 tablet (200 mg total) by mouth every 4 (four) hours as needed for cough or to loosen phlegm. 01/03/21   Teodora Medici, FNP  ibuprofen (ADVIL) 800 MG tablet TAKE 1 TABLET BY MOUTH EVERY 8 HOURS AS NEEDED 11/27/20   Gregor Hams, MD  insulin isophane & regular human KwikPen (NOVOLIN 70/30 KWIKPEN) (70-30) 100 UNIT/ML KwikPen Inject 10 Units into the skin 2 (two) times daily with a meal. 11/27/20   Nche, Charlene Brooke, NP  Insulin Pen Needle (BD PEN NEEDLE NANO 2ND GEN) 32G X 4 MM MISC USE WITH INSULIN PEN AS DIRECTED 10/11/20   Nche, Charlene Brooke, NP  lisinopril (ZESTRIL) 5 MG tablet Take 1 tablet (5 mg total) by mouth daily. 11/22/20   Nche, Charlene Brooke, NP  ONETOUCH VERIO test strip USE BEFORE BREAKFAST AND AT BEDTIME. 12/20/20   Nche, Charlene Brooke, NP  pantoprazole (PROTONIX) 20 MG tablet TAKE 1 TABLET (20 MG TOTAL) BY MOUTH DAILY. NO ADDITIONAL REFILLS WITHOUT OFFICE VISIT 01/10/21   Nche, Charlene Brooke, NP  pantoprazole (PROTONIX) 20 MG tablet Take 1 tablet (20 mg total) by mouth daily. No additional refills without office visit 11/13/20   Nche, Charlene Brooke, NP  pregabalin (LYRICA) 75 MG capsule Take 1 capsule (75 mg total) by mouth 2 (two) times daily as needed (nerve pain). 02/24/20   Gregor Hams, MD  promethazine-dextromethorphan (PROMETHAZINE-DM) 6.25-15 MG/5ML syrup Take 5 mLs by mouth 3 (three) times daily as needed for cough. 01/08/21   Nche, Charlene Brooke, NP  tiZANidine (ZANAFLEX) 4 MG tablet Take 1 tablet (4 mg total) by mouth every 6 (six) hours as needed for muscle spasms. 11/17/19    Gregor Hams, MD  venlafaxine XR (EFFEXOR-XR) 150 MG 24 hr capsule Take 1 capsule (150 mg total) by mouth daily with breakfast. 11/22/20   Nche, Charlene Brooke, NP  omeprazole (PRILOSEC OTC) 20 MG tablet Take 1 tablet (20 mg total) by mouth daily. 02/15/11 07/01/19  Noralee Space, MD    Allergies    Codeine phosphate  Review of Systems   Review of Systems  Constitutional:  Negative for fever.  HENT: Negative.    Eyes: Negative.   Respiratory:  Negative for shortness of breath.   Cardiovascular: Negative.   Gastrointestinal:  Positive for abdominal pain, blood in stool, diarrhea and nausea. Negative for abdominal distention, rectal pain and  vomiting.  Endocrine: Negative.   Genitourinary: Negative.  Negative for vaginal bleeding, vaginal discharge and vaginal pain.  Musculoskeletal: Negative.   Skin:  Negative for rash.  Neurological:  Negative for headaches.  All other systems reviewed and are negative.  Physical Exam Updated Vital Signs BP (!) 157/102 (BP Location: Right Arm)   Pulse (!) 103   Temp 98.3 F (36.8 C) (Oral)   Resp 20   Ht _0  (1.575 m)   Wt 61.7 kg   LMP 11/26/2013   SpO2 100%   BMI 24.87 kg/m   Physical Exam Vitals and nursing note reviewed.  Constitutional:      General: She is not in acute distress.    Appearance: She is not ill-appearing.  HENT:     Head: Atraumatic.  Eyes:     Conjunctiva/sclera: Conjunctivae normal.  Cardiovascular:     Rate and Rhythm: Normal rate and regular rhythm.     Pulses: Normal pulses.     Heart sounds: No murmur heard. Pulmonary:     Effort: Pulmonary effort is normal. No respiratory distress.     Breath sounds: Normal breath sounds.  Abdominal:     General: Abdomen is flat. There is no distension.     Palpations: Abdomen is soft.     Tenderness: There is abdominal tenderness in the right lower quadrant. There is no right CVA tenderness or left CVA tenderness. Positive signs include McBurney's sign.      Comments: Abdomen soft, nondistended, there is tenderness to palpation in the right lower quadrant.  Positive McBurney.  No peritoneal signs  Musculoskeletal:        General: Normal range of motion.     Cervical back: Normal range of motion.  Skin:    General: Skin is warm and dry.     Capillary Refill: Capillary refill takes less than 2 seconds.  Neurological:     General: No focal deficit present.     Mental Status: She is alert.  Psychiatric:        Mood and Affect: Mood normal.    ED Results / Procedures / Treatments   Labs (all labs ordered are listed, but only abnormal results are displayed) Labs Reviewed  COMPREHENSIVE METABOLIC PANEL - Abnormal; Notable for the following components:      Result Value   Sodium 131 (*)    Chloride 97 (*)    Glucose, Bld 364 (*)    AST 13 (*)    All other components within normal limits  CBC - Abnormal; Notable for the following components:   WBC 11.1 (*)    Platelets 442 (*)    All other components within normal limits  URINALYSIS, ROUTINE W REFLEX MICROSCOPIC - Abnormal; Notable for the following components:   Glucose, UA >=500 (*)    Hgb urine dipstick MODERATE (*)    All other components within normal limits  URINALYSIS, MICROSCOPIC (REFLEX) - Abnormal; Notable for the following components:   Bacteria, UA RARE (*)    All other components within normal limits  OCCULT BLOOD X 1 CARD TO LAB, STOOL - Abnormal; Notable for the following components:   Fecal Occult Bld POSITIVE (*)    All other components within normal limits  LIPASE, BLOOD  PREGNANCY, URINE  POC OCCULT BLOOD, ED    EKG EKG Interpretation  Date/Time:  Wednesday January 17 2021 23:31:49 EST Ventricular Rate:  94 PR Interval:  134 QRS Duration: 89 QT Interval:  349 QTC Calculation: 437 R  Axis:   94 Text Interpretation: Sinus rhythm Borderline right axis deviation Confirmed by Quintella Reichert [phone]) on 01/17/2021 11:37:10 PM  Radiology CT ABDOMEN PELVIS W  CONTRAST  Result Date: 01/17/2021 CLINICAL DATA:  Right lower quadrant abdominal pain. abdominal pain, + Mcburney, bloody stools EXAM: CT ABDOMEN AND PELVIS WITH CONTRAST TECHNIQUE: Multidetector CT imaging of the abdomen and pelvis was performed using the standard protocol following bolus administration of intravenous contrast. CONTRAST:  135m OMNIPAQUE IOHEXOL 300 MG/ML  SOLN COMPARISON:  Ultrasound abdomen 04/23/2018 FINDINGS: Lower chest: No acute abnormality.  Tiny hiatal hernia. Hepatobiliary: Several subcentimeter hypodensities are too small to characterize. Couple of fluid density lesions likely represent simple hepatic cysts measuring up to 1.9 cm in the right hepatic lobe and 1.6 cm in the left hepatic lobe. No gallstones, gallbladder wall thickening, or pericholecystic fluid. No biliary dilatation. Pancreas: No focal lesion. Normal pancreatic contour. No surrounding inflammatory changes. No main pancreatic ductal dilatation. Spleen: Normal in size without focal abnormality. Subcentimeter splenule is noted. Adrenals/Urinary Tract: No adrenal nodule bilaterally. Bilateral kidneys enhance symmetrically. No hydronephrosis. No hydroureter. Nonobstructive punctate left nephrolithiasis. The urinary bladder is unremarkable. On delayed imaging, there is no urothelial wall thickening and there are no filling defects in the opacified portions of the bilateral collecting systems or ureters. Stomach/Bowel: Stomach is within normal limits. No evidence of small bowel wall thickening or dilatation. Decompressed descending colon with associated bowel wall thickening and trace pericolonic fat stranding. Appendix appears normal. Vascular/Lymphatic: No abdominal aorta or iliac aneurysm. Severe atherosclerotic plaque of the aorta and its branches. No abdominal, pelvic, or inguinal lymphadenopathy. Reproductive: Heterogeneous lobulated appearance of the uterus suggestive of uterine fibroids with distortion/nonvisualization  of the endometrium. Other: No intraperitoneal free fluid. No intraperitoneal free gas. No organized fluid collection. Musculoskeletal: No abdominal wall hernia or abnormality. No suspicious lytic or blastic osseous lesions. No acute displaced fracture. IMPRESSION: 1. Mild descending colon colitis. Differential diagnosis for etiology includes: Infection, inflammation, ischemia. 2. Heterogeneous lobulated appearance of the uterus suggestive of uterine fibroids with distortion/nonvisualization of the endometrium. Recommend pelvic ultrasound for further evaluation. 3. Tiny hiatal hernia. 4. Couple hepatic lesions likely representing cysts. Several hepatic subcentimeter hypodensities too small to characterize. 5.  Aortic Atherosclerosis (ICD10-I70.0). Electronically Signed   By: MIven FinnM.D.   On: 01/17/2021 22:36    Procedures Procedures   Medications Ordered in ED Medications  sodium chloride 0.9 % bolus 1,000 mL ( Intravenous Stopped 01/17/21 2257)  iohexol (OMNIPAQUE) 300 MG/ML solution 100 mL (100 mLs Intravenous Contrast Given 01/17/21 2159)    ED Course  I have reviewed the triage vital signs and the nursing notes.  Pertinent labs & imaging results that were available during my care of the patient were reviewed by me and considered in my medical decision making (see chart for details).    MDM Rules/Calculators/A&P                         Initial impression: This is a 55year old female presents with abdominal pain and hematochezia with symptoms described in better detail above.  On exam she is nontoxic-appearing afebrile. She has some right lower quadrant tenderness to palpation, positive McBurney.  Blood glucose was 364 today so I gave her 1000 mL normal saline bolus.  Workup: CMP significant for glucose of 364 without anion gap, not acidotic.  CBC with white count 11.1.  Urinalysis with large amounts of glucose and moderate amounts of blood - otherwise  unremarkable.  CT abdomen with  evidence of mild descending colon colitis.  Also possible uterine fibroids and endometrium seen incidentally  Reassessment: Patient is resting comfortably, vital stable.  Rule-out: Low suspicion for ischemic colitis, no A. fib on x-ray, no history of vascular disease, symptoms otherwise consistent; low concern for diverticulitis, negative CT patient nontoxic-appearing   Plan: 1.  Colonic colitis-patient discharged home with 7-day course of Augmentin for pain.  She is to follow-up with your primary care doctor if her symptoms do not improve.  Alarm symptoms on what warrants to return back to the ED were discussed.  This case and treatment was discussed with Dr. Gerald Stabs Tegeler.  All of the lab, imaging, and/or EKG results were discussed with pt. They have expressed understanding of verbal discharge instructions and implications for return. Pt had opportunity to ask questions and has no further questions at this time.   All labs and imaging were independently reviewed and interpreted by myself, Kathe Becton, PA-C  Final Clinical Impression(s) / ED Diagnoses Final diagnoses:  Colitis    Rx / DC Orders ED Discharge Orders          Ordered    amoxicillin-clavulanate (AUGMENTIN) 875-125 MG tablet  Every 12 hours        01/17/21 2351    ondansetron (ZOFRAN) 4 MG tablet  Every 6 hours        01/17/21 2353             Tonye Pearson, PA-C 01/18/21 0058    Tonye Pearson, PA-C 01/18/21 0058    Tegeler, Gwenyth Allegra, MD 01/18/21 1143

## 2021-01-17 NOTE — ED Notes (Signed)
Attempted x 2 to obtain IV access

## 2021-01-17 NOTE — Telephone Encounter (Signed)
Please advise 

## 2021-01-17 NOTE — ED Triage Notes (Signed)
Pt c/o lower abd cramps-diarrhea x 2 days-bloody diarrhea x today-NAD-steady gait

## 2021-01-22 ENCOUNTER — Telehealth: Payer: Self-pay | Admitting: Nurse Practitioner

## 2021-01-22 NOTE — Telephone Encounter (Signed)
Pt is suppose to RTW on 01/23/21, she is wanting this extended until this Monday  01/29/21. She has just release from the hosptial. She has an upcoming appointment with Baldo Ash on 01/30/21.

## 2021-01-23 ENCOUNTER — Other Ambulatory Visit: Payer: Self-pay | Admitting: Nurse Practitioner

## 2021-01-23 DIAGNOSIS — J209 Acute bronchitis, unspecified: Secondary | ICD-10-CM

## 2021-01-25 ENCOUNTER — Encounter: Payer: Self-pay | Admitting: Nurse Practitioner

## 2021-01-25 NOTE — Telephone Encounter (Signed)
Work note extended until today with a return to work on 01/26/21, pt notified verbalized understanding.

## 2021-01-29 ENCOUNTER — Other Ambulatory Visit: Payer: Self-pay

## 2021-01-30 ENCOUNTER — Telehealth: Payer: Self-pay | Admitting: Gastroenterology

## 2021-01-30 ENCOUNTER — Inpatient Hospital Stay: Payer: BC Managed Care – PPO | Admitting: Nurse Practitioner

## 2021-01-30 ENCOUNTER — Encounter: Payer: Self-pay | Admitting: Gastroenterology

## 2021-01-30 NOTE — Telephone Encounter (Signed)
Hey Dr. Fuller Plan,   Patient called in to schedule recall colonoscopy. She would like to switch providers to Dr. Henrene Pastor since she have family that are patient of his. Would this change be okay with you?  Thank you

## 2021-01-30 NOTE — Telephone Encounter (Signed)
Dr. Fuller Plan patient called back and has decided to keep care with you. Patient was scheduled for 2/21 at 9:00

## 2021-01-30 NOTE — Telephone Encounter (Signed)
Switch to Dr. Henrene Pastor is ok with me if he approves.

## 2021-01-30 NOTE — Telephone Encounter (Signed)
OK 

## 2021-02-13 ENCOUNTER — Inpatient Hospital Stay: Payer: BC Managed Care – PPO | Admitting: Nurse Practitioner

## 2021-02-15 ENCOUNTER — Encounter (HOSPITAL_COMMUNITY): Payer: Self-pay | Admitting: Radiology

## 2021-02-19 ENCOUNTER — Telehealth: Payer: Self-pay | Admitting: Nurse Practitioner

## 2021-02-19 ENCOUNTER — Encounter: Payer: Self-pay | Admitting: Nurse Practitioner

## 2021-02-19 NOTE — Telephone Encounter (Signed)
Final no show warning letter mailed to pt. She has had 4 no shows but had extenuating circumstances.

## 2021-02-25 ENCOUNTER — Telehealth: Payer: BC Managed Care – PPO | Admitting: Emergency Medicine

## 2021-02-25 DIAGNOSIS — U071 COVID-19: Secondary | ICD-10-CM

## 2021-02-25 MED ORDER — NIRMATRELVIR/RITONAVIR (PAXLOVID)TABLET: 3.0000 | ORAL_TABLET | Freq: Two times a day (BID) | ORAL | 0 refills | Status: AC

## 2021-02-25 NOTE — Progress Notes (Signed)
Virtual Visit Consent   Brenda [name], you are scheduled for a virtual visit with a [facility] provider today.     Just as with appointments in the office, your consent must be obtained to participate.  Your consent will be active for this visit and any virtual visit you may have with one of our providers in the next 365 days.     If you have a MyChart account, a copy of this consent can be sent to you electronically.  All virtual visits are billed to your insurance company just like a traditional visit in the office.    As this is a virtual visit, video technology does not allow for your provider to perform a traditional examination.  This may limit your provider's ability to fully assess your condition.  If your provider identifies any concerns that need to be evaluated in person or the need to arrange testing (such as labs, EKG, etc.), we will make arrangements to do so.     Although advances in technology are sophisticated, we cannot ensure that it will always work on either your end or our end.  If the connection with a video visit is poor, the visit may have to be switched to a telephone visit.  With either a video or telephone visit, we are not always able to ensure that we have a secure connection.     I need to obtain your verbal consent now.   Are you willing to proceed with your visit today? Yes   Brenda [name] has provided verbal consent on 02/25/2021 for a virtual visit (video or telephone).   Lestine Box, [place]   Date: 02/25/2021 1:32 PM   Virtual Visit via Video Note   I, Lestine Box, connected with  Brenda [name]  ([id number], 1966/01/24) on 02/25/21 at  2:00 PM EST by a video-enabled telemedicine application and verified that I am speaking with the correct person using two identifiers.  Location: Patient: Virtual Visit Location Patient: Home Provider: Virtual Visit Location Provider: Home Office   I discussed the limitations of evaluation and management by  telemedicine and the availability of in person appointments. The patient expressed understanding and agreed to proceed.    History of Present Illness: Brenda [name] is a 56 y.o. who identifies as a female who was assigned female at birth, and is being seen today for runny nose, congestion, scratchy throat, cough, and fatigue x 2 days.  Denies known sick exposure to COVID, flu or strep.  Has tried OTC medications without relief.  Denies aggravating factors.  Reports previous symptoms with covid.   Denies fever, chills, SOB, wheezing, chest pain, nausea, changes in bowel or bladder habits.     ROS: As per HPI.  All other pertinent ROS negative.      HPI: HPI  Problems:  Patient Active Problem List   Diagnosis Date Noted   Acute cough 12/29/2020   Hepatic cyst 05/12/2018   Right knee pain 12/26/2015   DM (diabetes mellitus) ([place]) 10/17/2015   Depression 05/16/2014   Essential hypertension, benign 03/08/2013   DJD (degenerative joint disease) 06/16/2012   Hyperlipidemia LDL goal <100 08/18/2008   ANXIETY DISORDER, GENERALIZED 12/11/2006   ALLERGIC RHINITIS 12/11/2006   GERD 12/11/2006    Allergies:  Allergies  Allergen Reactions   Codeine Phosphate     REACTION: vomiting   Medications:  Current Outpatient Medications:    albuterol (VENTOLIN HFA) 108 (90 Base) MCG/ACT inhaler, INHALE 1-2 PUFFS BY  MOUTH EVERY 6 HOURS AS NEEDED FOR WHEEZE OR SHORTNESS OF BREATH, Disp: 6.7 each, Rfl: 0   amLODipine (NORVASC) 5 MG tablet, Take 1 tablet (5 mg total) by mouth daily., Disp: 90 tablet, Rfl: 3   amoxicillin-clavulanate (AUGMENTIN) 875-125 MG tablet, Take 1 tablet by mouth every 12 (twelve) hours., Disp: 14 tablet, Rfl: 0   atorvastatin (LIPITOR) 40 MG tablet, Take 1 tablet (40 mg total) by mouth daily at 6 PM., Disp: 90 tablet, Rfl: 3   benzonatate (TESSALON) 200 MG capsule, Take 1 capsule (200 mg total) by mouth 3 (three) times daily as needed., Disp: 30 capsule, Rfl: 0   blood glucose  meter kit and supplies KIT, Dispense based on patient and insurance preference.  Use before breakfast and at bedtime. E11.4, Disp: 1 each, Rfl: 1   dapagliflozin propanediol (FARXIGA) 5 MG TABS tablet, Take 1 tablet (5 mg total) by mouth daily before breakfast., Disp: 30 tablet, Rfl: 5   estradiol (CLIMARA - DOSED IN MG/24 HR) 0.025 mg/24hr patch, Place 1 patch (0.025 mg total) onto the skin once a week. Needs to schedule mammogram for additional refills, Disp: 12 patch, Rfl: 0   fluticasone (FLONASE) 50 MCG/ACT nasal spray, Place 2 sprays into both nostrils daily., Disp: 16 g, Rfl: 0   guaiFENesin 200 MG tablet, Take 1 tablet (200 mg total) by mouth every 4 (four) hours as needed for cough or to loosen phlegm., Disp: 30 suppository, Rfl: 0   ibuprofen (ADVIL) 800 MG tablet, TAKE 1 TABLET BY MOUTH EVERY 8 HOURS AS NEEDED, Disp: 30 tablet, Rfl: 0   insulin isophane & regular human KwikPen (NOVOLIN 70/30 KWIKPEN) (70-30) 100 UNIT/ML KwikPen, Inject 10 Units into the skin 2 (two) times daily with a meal., Disp: 15 mL, Rfl: 0   Insulin Pen Needle (BD PEN NEEDLE NANO 2ND GEN) 32G X 4 MM MISC, USE WITH INSULIN PEN AS DIRECTED, Disp: 100 each, Rfl: 1   lisinopril (ZESTRIL) 5 MG tablet, Take 1 tablet (5 mg total) by mouth daily., Disp: 90 tablet, Rfl: 3   ondansetron (ZOFRAN) 4 MG tablet, Take 1 tablet (4 mg total) by mouth every 6 (six) hours., Disp: 12 tablet, Rfl: 0   ONETOUCH VERIO test strip, USE BEFORE BREAKFAST AND AT BEDTIME., Disp: 200 strip, Rfl: 2   pantoprazole (PROTONIX) 20 MG tablet, TAKE 1 TABLET (20 MG TOTAL) BY MOUTH DAILY. NO ADDITIONAL REFILLS WITHOUT OFFICE VISIT, Disp: 30 tablet, Rfl: 0   pantoprazole (PROTONIX) 20 MG tablet, Take 1 tablet (20 mg total) by mouth daily. No additional refills without office visit, Disp: 10 tablet, Rfl: 0   pregabalin (LYRICA) 75 MG capsule, Take 1 capsule (75 mg total) by mouth 2 (two) times daily as needed (nerve pain)., Disp: 60 capsule, Rfl: 1    promethazine-dextromethorphan (PROMETHAZINE-DM) 6.25-15 MG/5ML syrup, Take 5 mLs by mouth 3 (three) times daily as needed for cough., Disp: 240 mL, Rfl: 0   tiZANidine (ZANAFLEX) 4 MG tablet, Take 1 tablet (4 mg total) by mouth every 6 (six) hours as needed for muscle spasms., Disp: 30 tablet, Rfl: 1   venlafaxine XR (EFFEXOR-XR) 150 MG 24 hr capsule, Take 1 capsule (150 mg total) by mouth daily with breakfast., Disp: 90 capsule, Rfl: 3  Observations/Objective: Patient is well-developed, well-nourished in no acute distress.  Resting comfortably at home.  Head is normocephalic, atraumatic.  No labored breathing. Speaking in full sentences and tolerating own secretions Speech is clear and coherent with logical content.  Patient  is alert and oriented at baseline.    Assessment and Plan: There are no diagnoses linked to this encounter. COVID test was positive You should remain isolated in your home for 5 days from symptom onset AND greater than 72 hours after symptoms resolution (absence of fever without the use of fever-reducing medication and improvement in respiratory symptoms), whichever is longer Get plenty of rest and push fluids Paxlovid prescribed.  Take as directed and to completion Use zyrtec for nasal congestion, runny nose, and/or sore throat Use flonase for nasal congestion and runny nose Use medications daily for symptom relief Use OTC medications like ibuprofen or tylenol as needed fever or pain Follow up with PCP in 1-2 days via phone or e-visit for recheck and to ensure symptoms are improving Call or go to the ED if you have any new or worsening symptoms such as fever, worsening cough, shortness of breath, chest tightness, chest pain, turning blue, changes in mental status, etc...    Follow Up Instructions: I discussed the assessment and treatment plan with the patient. The patient was provided an opportunity to ask questions and all were answered. The patient agreed with the  plan and demonstrated an understanding of the instructions.  A copy of instructions were sent to the patient via MyChart unless otherwise noted below.   The patient was advised to call back or seek an in-person evaluation if the symptoms worsen or if the condition fails to improve as anticipated.  Time:  I spent 5-10 minutes with the patient via telehealth technology discussing the above problems/concerns.    Lestine Box, PA-C

## 2021-02-25 NOTE — Patient Instructions (Signed)
Brenda [name], thank you for joining Lestine Box, PA-C for today's virtual visit.  While this provider is not your primary care provider (PCP), if your PCP is located in our provider database this encounter information will be shared with them immediately following your visit.  Consent: (Patient) Brenda [name] provided verbal consent for this virtual visit at the beginning of the encounter.  Current Medications:  Current Outpatient Medications:    nirmatrelvir/ritonavir EUA (PAXLOVID) 20 x 150 MG & 10 x 100MG TABS, Take 3 tablets by mouth 2 (two) times daily for 5 days. (Take nirmatrelvir 150 mg two tablets twice daily for 5 days and ritonavir 100 mg one tablet twice daily for 5 days) Patient GFR is >60, Disp: 30 tablet, Rfl: 0   albuterol (VENTOLIN HFA) 108 (90 Base) MCG/ACT inhaler, INHALE 1-2 PUFFS BY MOUTH EVERY 6 HOURS AS NEEDED FOR WHEEZE OR SHORTNESS OF BREATH, Disp: 6.7 each, Rfl: 0   amLODipine (NORVASC) 5 MG tablet, Take 1 tablet (5 mg total) by mouth daily., Disp: 90 tablet, Rfl: 3   amoxicillin-clavulanate (AUGMENTIN) 875-125 MG tablet, Take 1 tablet by mouth every 12 (twelve) hours., Disp: 14 tablet, Rfl: 0   atorvastatin (LIPITOR) 40 MG tablet, Take 1 tablet (40 mg total) by mouth daily at 6 PM., Disp: 90 tablet, Rfl: 3   benzonatate (TESSALON) 200 MG capsule, Take 1 capsule (200 mg total) by mouth 3 (three) times daily as needed., Disp: 30 capsule, Rfl: 0   blood glucose meter kit and supplies KIT, Dispense based on patient and insurance preference.  Use before breakfast and at bedtime. E11.4, Disp: 1 each, Rfl: 1   dapagliflozin propanediol (FARXIGA) 5 MG TABS tablet, Take 1 tablet (5 mg total) by mouth daily before breakfast., Disp: 30 tablet, Rfl: 5   estradiol (CLIMARA - DOSED IN MG/24 HR) 0.025 mg/24hr patch, Place 1 patch (0.025 mg total) onto the skin once a week. Needs to schedule mammogram for additional refills, Disp: 12 patch, Rfl: 0   fluticasone (FLONASE) 50  MCG/ACT nasal spray, Place 2 sprays into both nostrils daily., Disp: 16 g, Rfl: 0   guaiFENesin 200 MG tablet, Take 1 tablet (200 mg total) by mouth every 4 (four) hours as needed for cough or to loosen phlegm., Disp: 30 suppository, Rfl: 0   ibuprofen (ADVIL) 800 MG tablet, TAKE 1 TABLET BY MOUTH EVERY 8 HOURS AS NEEDED, Disp: 30 tablet, Rfl: 0   insulin isophane & regular human KwikPen (NOVOLIN 70/30 KWIKPEN) (70-30) 100 UNIT/ML KwikPen, Inject 10 Units into the skin 2 (two) times daily with a meal., Disp: 15 mL, Rfl: 0   Insulin Pen Needle (BD PEN NEEDLE NANO 2ND GEN) 32G X 4 MM MISC, USE WITH INSULIN PEN AS DIRECTED, Disp: 100 each, Rfl: 1   lisinopril (ZESTRIL) 5 MG tablet, Take 1 tablet (5 mg total) by mouth daily., Disp: 90 tablet, Rfl: 3   ondansetron (ZOFRAN) 4 MG tablet, Take 1 tablet (4 mg total) by mouth every 6 (six) hours., Disp: 12 tablet, Rfl: 0   ONETOUCH VERIO test strip, USE BEFORE BREAKFAST AND AT BEDTIME., Disp: 200 strip, Rfl: 2   pantoprazole (PROTONIX) 20 MG tablet, TAKE 1 TABLET (20 MG TOTAL) BY MOUTH DAILY. NO ADDITIONAL REFILLS WITHOUT OFFICE VISIT, Disp: 30 tablet, Rfl: 0   pantoprazole (PROTONIX) 20 MG tablet, Take 1 tablet (20 mg total) by mouth daily. No additional refills without office visit, Disp: 10 tablet, Rfl: 0   pregabalin (LYRICA) 75 MG capsule,  Take 1 capsule (75 mg total) by mouth 2 (two) times daily as needed (nerve pain)., Disp: 60 capsule, Rfl: 1   promethazine-dextromethorphan (PROMETHAZINE-DM) 6.25-15 MG/5ML syrup, Take 5 mLs by mouth 3 (three) times daily as needed for cough., Disp: 240 mL, Rfl: 0   tiZANidine (ZANAFLEX) 4 MG tablet, Take 1 tablet (4 mg total) by mouth every 6 (six) hours as needed for muscle spasms., Disp: 30 tablet, Rfl: 1   venlafaxine XR (EFFEXOR-XR) 150 MG 24 hr capsule, Take 1 capsule (150 mg total) by mouth daily with breakfast., Disp: 90 capsule, Rfl: 3   Medications ordered in this encounter:  Meds ordered this encounter   Medications   nirmatrelvir/ritonavir EUA (PAXLOVID) 20 x 150 MG & 10 x 100MG TABS    Sig: Take 3 tablets by mouth 2 (two) times daily for 5 days. (Take nirmatrelvir 150 mg two tablets twice daily for 5 days and ritonavir 100 mg one tablet twice daily for 5 days) Patient GFR is >60    Dispense:  30 tablet    Refill:  0    Order Specific Question:   Supervising Provider    Answer:   Noemi Chapel [[id number]]     *If you need refills on other medications prior to your next appointment, please contact your pharmacy*  Follow-Up: Call back or seek an in-person evaluation if the symptoms worsen or if the condition fails to improve as anticipated.  Other Instructions COVID test was positive You should remain isolated in your home for 5 days from symptom onset AND greater than 72 hours after symptoms resolution (absence of fever without the use of fever-reducing medication and improvement in respiratory symptoms), whichever is longer Get plenty of rest and push fluids Paxlovid prescribed.  Take as directed and to completion Use zyrtec for nasal congestion, runny nose, and/or sore throat Use flonase for nasal congestion and runny nose Use medications daily for symptom relief Use OTC medications like ibuprofen or tylenol as needed fever or pain Follow up with PCP in 1-2 days via phone or e-visit for recheck and to ensure symptoms are improving Call or go to the ED if you have any new or worsening symptoms such as fever, worsening cough, shortness of breath, chest tightness, chest pain, turning blue, changes in mental status, etc...     If you have been instructed to have an in-person evaluation today at a local Urgent Care facility, please use the link below. It will take you to a list of all of our available [facility], including address, phone number and hours of operation. Please do not delay care.  [facility]  If you or a family member do not have a primary care  provider, use the link below to schedule a visit and establish care. When you choose a [facility] primary care physician or advanced practice provider, you gain a long-term partner in health. Find a Primary Care Provider  Learn more about [facility]'s in-office and virtual care options: [facility]

## 2021-02-26 ENCOUNTER — Telehealth: Payer: Self-pay | Admitting: Nurse Practitioner

## 2021-02-26 NOTE — Telephone Encounter (Signed)
Pt is wondering if her short term disability paperwork via email has arrived. I asked her to send it via mychart. She is going to do that. Please advise pt when you get her paperwork at [phone].

## 2021-02-27 ENCOUNTER — Inpatient Hospital Stay: Payer: BC Managed Care – PPO | Admitting: Nurse Practitioner

## 2021-02-28 ENCOUNTER — Encounter: Payer: Self-pay | Admitting: Nurse Practitioner

## 2021-02-28 NOTE — Telephone Encounter (Signed)
Pt is going to upload to [place]. I walked her through how to do that. She will call if she has trouble.

## 2021-02-28 NOTE — Telephone Encounter (Signed)
LVM for patient to return call I do not see forms in MyChart

## 2021-03-02 ENCOUNTER — Other Ambulatory Visit: Payer: Self-pay | Admitting: Nurse Practitioner

## 2021-03-05 ENCOUNTER — Telehealth: Payer: Self-pay

## 2021-03-05 NOTE — Telephone Encounter (Signed)
LVM for patient to return call in regards to [facility] forms that could not be completed.

## 2021-03-06 ENCOUNTER — Encounter: Payer: Self-pay | Admitting: Nurse Practitioner

## 2021-03-13 NOTE — Telephone Encounter (Signed)
Pt called and said the letter wasn't showing in mychart were she can print it and asked if we can re upload it. I would but im not sure how. Please advise

## 2021-03-14 ENCOUNTER — Inpatient Hospital Stay: Payer: BC Managed Care – PPO | Admitting: Nurse Practitioner

## 2021-03-16 ENCOUNTER — Ambulatory Visit: Payer: BC Managed Care – PPO | Admitting: Dietician

## 2021-04-03 ENCOUNTER — Encounter: Payer: BC Managed Care – PPO | Admitting: Gastroenterology

## 2021-04-09 ENCOUNTER — Telehealth: Payer: Self-pay | Admitting: Nurse Practitioner

## 2021-04-09 ENCOUNTER — Inpatient Hospital Stay: Payer: BC Managed Care – PPO | Admitting: Nurse Practitioner

## 2021-04-09 NOTE — Telephone Encounter (Signed)
Patient/Caregiver was notified of No Show/Late Cancellation Policy & possible $28 charge. Visit was cancelled with reason "No Show/Cancel within 24 hours" for tracking & charging.  Caller Name: adalyne Antkowiak Caller Ph #: [phone] Date of APPT: 04/09/21 Reason given for no show/late cancellation: no reason no show  No Show Letter printed & put in outgoing mail (Yes/No): yes  ~~~Route message to admin supervisor and clinical team/CMA~~~

## 2021-04-16 ENCOUNTER — Ambulatory Visit: Payer: BC Managed Care – PPO | Admitting: Nurse Practitioner

## 2021-04-16 ENCOUNTER — Telehealth: Payer: Self-pay | Admitting: Nurse Practitioner

## 2021-04-16 ENCOUNTER — Encounter: Payer: Self-pay | Admitting: Nurse Practitioner

## 2021-04-16 NOTE — Telephone Encounter (Signed)
Charged no show fee. ?

## 2021-04-16 NOTE — Telephone Encounter (Signed)
Pt has had 4-5 prior no show visits. Charge generated for 2/27 and 3/6 missed visits. Dismissal letter being mailed out. ?

## 2021-04-16 NOTE — Telephone Encounter (Signed)
Patient/Caregiver was notified of No Show/Late Cancellation Policy & possible $53 charge. ?Visit was cancelled with reason "No Show/Cancel within 24 hours" for tracking & charging. ? ?Caller Name: Brenda [name]  ?Caller Ph #: [phone] ?Date of APPT: 04/16/21 ?Reason given for no show/late cancellation: no reason no show  ?No Show Letter printed & put in outgoing mail (Yes/No): Yes ? ?~~~Route message to admin supervisor and clinical team/CMA~~~ ? ? ? ?

## 2021-04-18 ENCOUNTER — Telehealth: Payer: Self-pay

## 2021-04-18 ENCOUNTER — Ambulatory Visit: Payer: BC Managed Care – PPO

## 2021-04-18 NOTE — Telephone Encounter (Signed)
Multiple attempts made to reach patient; unable to leave a message due to voicemail not being set up; ? ?No show letter sent to patient via MyChart;  ? ?PV and procedure appts cancelled; ?

## 2021-05-02 ENCOUNTER — Encounter: Payer: BC Managed Care – PPO | Admitting: Gastroenterology

## 2021-05-02 ENCOUNTER — Telehealth: Payer: Self-pay | Admitting: Gastroenterology

## 2021-05-02 NOTE — Telephone Encounter (Signed)
Called patient to see if she was coming in for her procedure. Patient states she called yesterday to cancel due to a work conflict with her schedule. She will call back to R/S. ?

## 2021-05-02 NOTE — Telephone Encounter (Signed)
Please charge for late [place] cancellation.  ?

## 2021-05-12 ENCOUNTER — Encounter: Payer: Self-pay | Admitting: Nurse Practitioner

## 2021-05-12 ENCOUNTER — Other Ambulatory Visit: Payer: Self-pay | Admitting: Family Medicine

## 2021-05-22 NOTE — Telephone Encounter (Signed)
LVM for pt to call me if she would like to discuss dismissal policy ?

## 2021-06-04 ENCOUNTER — Encounter: Payer: Self-pay | Admitting: Family Medicine

## 2021-06-04 ENCOUNTER — Telehealth: Payer: Self-pay | Admitting: Family Medicine

## 2021-06-04 NOTE — Telephone Encounter (Signed)
Work note printed and sent electronically. ?

## 2021-06-04 NOTE — Telephone Encounter (Signed)
Patient called to schedule an appointment with Dr [name] for her continued arm pain. (Pt is scheduled for Monday, May 1st. She has already used all days for this month so had to wait until next month.) ?She said that she had to miss work today because of it and is needing a doctors note. ?Would Dr [name] be willing to write one for her today? ? ?Please advise. ? ? ?

## 2021-06-04 NOTE — Telephone Encounter (Signed)
Spoke to patient, she got her letter from Dr [name] through [place]. ?

## 2021-06-08 NOTE — Progress Notes (Deleted)
   I, Wendy Poet, LAT, ATC, am serving as scribe for Dr. [name].  Brenda [name] is a 56 y.o. female who presents to [place] at [facility] today for R/L arm pain.  She was last seen by Dr. [name] on 07/18/20 for L knee pain and had a L knee steroid injection.  Today, pt reports R/L arm pain x .  She locates her pain to .  Radiating pain: Neck pain: UE paresthesias: Aggravating factors: Treatments tried:   Diagnostic testing: R shoulder XR- 06/27/19  Pertinent review of systems: ***  Relevant historical information: ***   Exam:  LMP 11/26/2013  General: Well Developed, well nourished, and in no acute distress.   MSK: ***    Lab and Radiology Results No results found for this or any previous visit (from the past 72 hour(s)). No results found.     Assessment and Plan: 56 y.o. female with ***   PDMP not reviewed this encounter. No orders of the defined types were placed in this encounter.  No orders of the defined types were placed in this encounter.    Discussed warning signs or symptoms. Please see discharge instructions. Patient expresses understanding.   ***

## 2021-06-11 ENCOUNTER — Ambulatory Visit: Payer: BC Managed Care – PPO | Admitting: Family Medicine

## 2021-06-13 ENCOUNTER — Telehealth: Payer: Self-pay | Admitting: Family Medicine

## 2021-06-13 NOTE — Telephone Encounter (Signed)
Patient called stating that she had to be out of work today due to the pain in her right arm.  ?Her work is asking for a doctors note due to her absence.  ? ?Can this letter be written and sent to her through [place]? ? ? ?

## 2021-06-14 ENCOUNTER — Encounter: Payer: Self-pay | Admitting: Family Medicine

## 2021-06-14 NOTE — Telephone Encounter (Signed)
Done

## 2021-06-25 ENCOUNTER — Ambulatory Visit: Payer: BC Managed Care – PPO | Admitting: Family Medicine

## 2021-06-25 NOTE — Progress Notes (Deleted)
    Brenda [name] is a 56 y.o. female who presents to [place] at [facility] today for ***   Pertinent review of systems: ***  Relevant historical information: ***   Exam:  LMP 11/26/2013  General: Well Developed, well nourished, and in no acute distress.   MSK: ***    Lab and Radiology Results No results found for this or any previous visit (from the past 72 hour(s)). No results found.     Assessment and Plan: 56 y.o. female with ***   PDMP not reviewed this encounter. No orders of the defined types were placed in this encounter.  No orders of the defined types were placed in this encounter.    Discussed warning signs or symptoms. Please see discharge instructions. Patient expresses understanding.   ***

## 2021-07-02 ENCOUNTER — Telehealth: Payer: Self-pay | Admitting: *Deleted

## 2021-07-02 NOTE — Telephone Encounter (Signed)
Pt called stating she needs a letter stating she cannot stand up for a full 8 hours at work due to her back & shoulder. Send letter to her through [place].

## 2021-07-09 ENCOUNTER — Encounter: Payer: Self-pay | Admitting: Certified Registered Nurse Anesthetist

## 2021-07-11 ENCOUNTER — Encounter: Payer: Self-pay | Admitting: Family Medicine

## 2021-07-12 ENCOUNTER — Telehealth: Payer: Self-pay

## 2021-07-12 NOTE — Telephone Encounter (Signed)
Called pt to see if she had a pre-op appt or if she received any instructions for colonoscopy scheduled for 07/16/21. Pt stated she had not received any instructions nor has she seen a pre-op nurse or office visit. Cancelled appt scheduled for 07/16/21. Scheduled pt for a pre-visit on 08/24/21, and colonoscopy on 09/11/21. Let patient know that she would need to arrive at [facility] facilities for the pre-visit appt. Pt has previously no showed in the past, explained to patient that if she did not arrive for pre-visit appt she would be dropped from our practice. Pt verbalized understanding and plans to arrive for pre-visit appt. She had no further concerns at the end of the call.

## 2021-07-16 ENCOUNTER — Encounter: Payer: BC Managed Care – PPO | Admitting: Gastroenterology

## 2021-07-20 NOTE — Progress Notes (Deleted)
   I, Wendy Poet, LAT, ATC, am serving as scribe for Dr. [name].  Brenda [name] is a 56 y.o. female who presents to [place] at [facility] today for R/L arm pain.  She was last seen by Dr. [name] on 07/18/20 for L knee pain.  Today, pt reports arm pain x .  She locates her pain to .  Radiating pain: Aggravating factors: Treatments tried:   Diagnostic testing: L knee XR- 07/18/20; L-spine XR- 02/24/20; R shoulder XR- 06/27/19  Pertinent review of systems: ***  Relevant historical information: ***   Exam:  LMP 11/26/2013  General: Well Developed, well nourished, and in no acute distress.   MSK: ***    Lab and Radiology Results No results found for this or any previous visit (from the past 72 hour(s)). No results found.     Assessment and Plan: 56 y.o. female with ***   PDMP not reviewed this encounter. No orders of the defined types were placed in this encounter.  No orders of the defined types were placed in this encounter.    Discussed warning signs or symptoms. Please see discharge instructions. Patient expresses understanding.   ***

## 2021-07-23 ENCOUNTER — Ambulatory Visit: Payer: BC Managed Care – PPO | Admitting: Family Medicine

## 2021-07-30 ENCOUNTER — Ambulatory Visit: Payer: BC Managed Care – PPO | Admitting: Family Medicine

## 2021-07-31 NOTE — Progress Notes (Deleted)
  Subjective:    Brenda [name] - 56 y.o. female MRN [id number]  Date of birth: 28-Aug-1965  HPI  Brenda [name] is to establish care.   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 She has never used smokeless tobacco. She reports current alcohol use. She reports that she does not use drugs.   Family History  family history includes Alcohol abuse in her mother; Allergies in her sister; Arthritis in her mother; Asthma in her mother and sister; Diabetes in her mother and sister; Heart disease in her maternal grandfather.   Medications: reviewed and updated   Objective:   Physical Exam LMP 11/26/2013  Physical Exam      Assessment & Plan:         Patient was given clear instructions to go to Emergency Department or return to medical center if symptoms don't improve, worsen, or new problems develop.The patient verbalized understanding.  I discussed the assessment and treatment plan with the patient. The patient was provided an opportunity to ask questions and all were  answered. The patient agreed with the plan and demonstrated an understanding of the instructions.   The patient was advised to call back or seek an in-person evaluation if the symptoms worsen or if the condition fails to improve as anticipated.    ADurene Fruits NP 07/31/2021, 2:40 PM [facility]

## 2021-08-06 ENCOUNTER — Ambulatory Visit: Payer: BC Managed Care – PPO | Admitting: Family

## 2021-08-06 ENCOUNTER — Ambulatory Visit (INDEPENDENT_AMBULATORY_CARE_PROVIDER_SITE_OTHER): Payer: BC Managed Care – PPO | Admitting: Family Medicine

## 2021-08-06 VITALS — BP 132/82 | HR 96 | Ht 62.0 in | Wt 135.6 lb

## 2021-08-06 DIAGNOSIS — M545 Low back pain, unspecified: Secondary | ICD-10-CM | POA: Diagnosis not present

## 2021-08-06 DIAGNOSIS — M25552 Pain in left hip: Secondary | ICD-10-CM
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
## 2021-08-07 ENCOUNTER — Ambulatory Visit: Payer: BC Managed Care – PPO | Admitting: Family

## 2021-08-23 ENCOUNTER — Telehealth: Payer: Self-pay | Admitting: Family Medicine

## 2021-08-23 NOTE — Telephone Encounter (Signed)
Patient missed work this week 7/10-7/14 due to R arm pain. Plans to go back on Monday 7/17.  Would like a work note, can retrieve from [employer].

## 2021-08-24 ENCOUNTER — Encounter: Payer: Self-pay | Admitting: Family Medicine

## 2021-08-24 NOTE — Telephone Encounter (Signed)
Letter sent through MyChart as well as printed and should be available at the front desk.

## 2021-09-04 ENCOUNTER — Telehealth: Payer: Self-pay | Admitting: Gastroenterology

## 2021-09-04 NOTE — Telephone Encounter (Signed)
Inbound call from patient cancelling upcoming procedure due to not be able to have time off from work. Patient states she will call back to reschedule.  Thank you

## 2021-09-11 ENCOUNTER — Encounter: Payer: BC Managed Care – PPO | Admitting: Gastroenterology

## 2021-09-17 NOTE — Progress Notes (Signed)
Erroneous encounter-disregard

## 2021-09-24 ENCOUNTER — Encounter: Payer: BC Managed Care – PPO | Admitting: Family

## 2021-09-24 DIAGNOSIS — E1165 Type 2 diabetes mellitus with hyperglycemia: Secondary | ICD-10-CM

## 2021-09-24 DIAGNOSIS — Z7689 Persons encountering health services in other specified circumstances: Secondary | ICD-10-CM

## 2021-09-26 ENCOUNTER — Telehealth: Payer: Self-pay | Admitting: Family Medicine

## 2021-09-26 NOTE — Telephone Encounter (Signed)
Pt has missed work Aug 6-Aug 16 due to muscle spasms in her back. Plans to return August 17.  Able to retrieve note from [place].

## 2021-09-26 NOTE — Telephone Encounter (Signed)
You missed 10 days of work because her back was hurting so bad.  We did an office visit.  We need a different plan for your back pain.  I am going to have a very hard time justifying this amount of missing work.  Please schedule an office visit with me.

## 2021-09-28 ENCOUNTER — Other Ambulatory Visit: Payer: Self-pay | Admitting: Family Medicine

## 2021-09-28 ENCOUNTER — Ambulatory Visit (INDEPENDENT_AMBULATORY_CARE_PROVIDER_SITE_OTHER): Payer: BC Managed Care – PPO

## 2021-09-28 ENCOUNTER — Encounter: Payer: Self-pay | Admitting: Family Medicine

## 2021-09-28 ENCOUNTER — Ambulatory Visit (INDEPENDENT_AMBULATORY_CARE_PROVIDER_SITE_OTHER): Payer: BC Managed Care – PPO | Admitting: Family Medicine

## 2021-09-28 VITALS — BP 132/84 | HR 84 | Ht 62.0 in | Wt 137.0 lb

## 2021-09-28 DIAGNOSIS — G8929 Other chronic pain: Secondary | ICD-10-CM

## 2021-09-28 DIAGNOSIS — M542 Cervicalgia: Secondary | ICD-10-CM

## 2021-09-28 DIAGNOSIS — M546 Pain in thoracic spine: Secondary | ICD-10-CM | POA: Diagnosis not present

## 2021-09-28 MED ORDER — TIZANIDINE HCL 4 MG PO TABS: 2.0000 mg | ORAL_TABLET | Freq: Three times a day (TID) | ORAL | 1 refills | Status: DC | PRN

## 2021-09-28 NOTE — Progress Notes (Signed)
I, [name], LAT, ATC acting as a scribe for [name], MD.  Brenda [name] is a 56 y.o. female who presents to [place] at [facility] today for cont'd back pain. Pt was last seen by Dr. [name] on 08/06/21 for L hip pain and was advised to cont HEP and to have her employer fax [place] any new FMLA paperwork. Pt called the office on 09/26/21, after missing 10 days of work, 8/6-8/16, requesting a work note, and was advised to schedule a f/u visit. Today, pt reports she got real hot at work and then the next morning her upper back was in spasm. Pt locates pain to around bilat scapula, bilat trapz, and into both sides of her neck.   Dx imaging: 02/24/20 L-spine XR  Pertinent review of systems: No fevers or chills  Relevant historical information: Hypertension   Exam:  BP 132/84   Pulse 84   Ht '5\' 2"'$  (1.575 m)   Wt 137 lb (62.1 kg)   LMP 11/26/2013   SpO2 98%   BMI 25.06 kg/m  General: Well Developed, well nourished, and in no acute distress.   MSK: C-spine: Normal appearing. Nontender to midline. Tender palpation cervical paraspinal musculature and trapezius bilaterally. Decreased motion. Upper extremity strength and reflexes are equal and intact  T-spine: Normal appearing Nontender midline.  Tender palpation thoracic paraspinal musculature rhomboids and trapezius. Upper extremity strength is intact    Lab and Radiology Results  X-ray images C-spine and T-spine obtained today personally and interpreted.  C-spine: No acute fractures.  Mild degenerative changes.  T-spine mild degenerative changes.  No acute fractures.  Await formal radiology review   Assessment and Plan: 56 y.o. female with acute cervical and thoracic paraspinal muscle pain occurring on or around August 3 ending just recently.  She missed work for this entire period of time and plans to return to work on 21 August.  This is a newer problem for her.  If we were to see her at the onset of  this physical therapy would have been the treatment of choice.  However she started to feel a lot better now so I do not think physical therapy is necessary.  Watchful waiting heating pad and tizanidine is pretty reasonable.  However symptoms return physical therapy would be our next step.  Work note and FMLA completed now so that she can return to work on 21 August.  Recheck with me as needed.   PDMP not reviewed this encounter. Orders Placed This Encounter  Procedures   DG Cervical Spine 2 or 3 views    Standing Status:   Future    Number of Occurrences:   1    Standing Expiration Date:   09/29/2022    Order Specific Question:   Reason for Exam (SYMPTOM  OR DIAGNOSIS REQUIRED)    Answer:   neck pain    Order Specific Question:   Is patient pregnant?    Answer:   No    Order Specific Question:   Preferred imaging location?    Answer:   [name]   Meds ordered this encounter  Medications   tiZANidine (ZANAFLEX) 4 MG tablet    Sig: Take 0.5-1 tablets (2-4 mg total) by mouth every 8 (eight) hours as needed for muscle spasms.    Dispense:  60 tablet    Refill:  1     Discussed warning signs or symptoms. Please see discharge instructions. Patient expresses understanding.  The above documentation has been reviewed and is accurate and complete [name], M.D.

## 2021-09-28 NOTE — Patient Instructions (Addendum)
Thank you for coming in today.   Please get an Xray today before you leave   Check back as needed

## 2021-10-01 NOTE — Progress Notes (Signed)
Cervical spine x-ray shows a little bit of arthritis changes.

## 2021-10-01 NOTE — Progress Notes (Signed)
Thoracic spine x-ray shows mild arthritis changes.

## 2021-11-15 ENCOUNTER — Telehealth: Payer: Self-pay | Admitting: Family Medicine

## 2021-11-15 NOTE — Telephone Encounter (Signed)
I received FMLA paperwork for [place].  I was puzzled by this because it was unexpected.  After talking to the [employer] leave the management company I spoke with [place].  She has missed 2 months of work because she is significantly depressed.  That is the reason for new leave.  This will be continuous leave for 2 months depression.  Fortunately she is actively managing her depression.  She is receiving EAP counseling at work and has already seen a psychiatrist once and has a next meeting with a psychiatrist next week.  She is establishing care with a new primary care provider next week on October 10.  She currently is taking Effexor. She does not feel actively suicidal today.  I did provide information for the [facility] behavioral health urgent care.    I understand that her paperwork needs to be completed but I am not treating her depression so I am not sure that I can really fill out her paperwork correctly.  Recommend that her primary care provider may be a better choice.  Happy to do it in a pinch if needed.

## 2021-11-20 ENCOUNTER — Ambulatory Visit: Payer: BC Managed Care – PPO | Admitting: Family

## 2021-11-20 ENCOUNTER — Encounter: Payer: Self-pay | Admitting: Family

## 2021-11-20 VITALS — BP 130/78 | HR 103 | Temp 97.3°F | Resp 16 | Ht 62.0 in | Wt 134.2 lb

## 2021-11-20 DIAGNOSIS — E1165 Type 2 diabetes mellitus with hyperglycemia: Secondary | ICD-10-CM

## 2021-11-20 DIAGNOSIS — Z23 Encounter for immunization: Secondary | ICD-10-CM | POA: Diagnosis not present

## 2021-11-20 DIAGNOSIS — E785 Hyperlipidemia, unspecified: Secondary | ICD-10-CM

## 2021-11-20 DIAGNOSIS — Z7689 Persons encountering health services in other specified circumstances: Secondary | ICD-10-CM

## 2021-11-20 DIAGNOSIS — Z113 Encounter for screening for infections with a predominantly sexual mode of transmission: Secondary | ICD-10-CM

## 2021-11-20 DIAGNOSIS — Z794 Long term (current) use of insulin: Secondary | ICD-10-CM

## 2021-11-20 DIAGNOSIS — I10 Essential (primary) hypertension: Secondary | ICD-10-CM

## 2021-11-20 DIAGNOSIS — F320 Major depressive disorder, single episode, mild: Secondary | ICD-10-CM

## 2021-11-20 DIAGNOSIS — K2100 Gastro-esophageal reflux disease with esophagitis, without bleeding: Secondary | ICD-10-CM

## 2021-11-20 DIAGNOSIS — M5432 Sciatica, left side: Secondary | ICD-10-CM

## 2021-11-20 DIAGNOSIS — N951 Menopausal and female climacteric states: Secondary | ICD-10-CM

## 2021-11-20 DIAGNOSIS — Z1211 Encounter for screening for malignant neoplasm of colon: Secondary | ICD-10-CM

## 2021-11-20 DIAGNOSIS — Z1231 Encounter for screening mammogram for malignant neoplasm of breast: Secondary | ICD-10-CM

## 2021-11-20 MED ORDER — IBUPROFEN 800 MG PO TABS: 800.0000 mg | ORAL_TABLET | Freq: Three times a day (TID) | ORAL | 0 refills | Status: DC | PRN

## 2021-11-20 MED ORDER — ESTRADIOL 0.025 MG/24HR TD PTWK: 0.0250 mg | MEDICATED_PATCH | TRANSDERMAL | 0 refills | Status: DC

## 2021-11-20 MED ORDER — NOVOLIN 70/30 FLEXPEN (70-30) 100 UNIT/ML ~~LOC~~ SUPN: 10.0000 [IU] | PEN_INJECTOR | Freq: Two times a day (BID) | SUBCUTANEOUS | 0 refills | Status: DC

## 2021-11-20 NOTE — Patient Instructions (Signed)
Please get Shingles vaccine and COVID-19 booster vaccine at your pharmacy

## 2021-11-20 NOTE — Progress Notes (Signed)
Provider: Marlowe Sax FNP-C   Treasa Bradshaw, Nelda Bucks, NP  Patient Care Team: Aubriella Perezgarcia, Nelda Bucks, NP as PCP - General (Family Medicine)  Extended Emergency Contact Information Primary Emergency Contact: Gaither,Gladys Address: [address]          [place], [place] [address] [place] L[place] Phone: [phone] Relation: Mother Secondary Emergency Contact: [place] Phone: [phone] Relation: Brother  Code Status:  Full Code  Goals of care: Advanced Directive information    11/20/2021    3:14 PM  Advanced Directives  Does Patient Have a Medical Advance Directive? No  Would patient like information on creating a medical advance directive? No - Patient declined     Chief Complaint  Patient presents with   Establish Care    New Patient.     HPI:  Pt is a 56 y.o. female seen today establish care here at [facility] and Adult  care for medical management of chronic diseases.  Hypertension - does not check b/p regularly runs in the 130's/80's..She denies any headache,dizziness,vision changes,fatigue,chest tightness,palpitation,chest pain or shortness of breath.      Type 2 DM - on Novolin 70/30 injects 10 units twice daily .Has been on faxiga but was too expensive.  She denies any symptoms of hypoglycemia.  GERD - on Protonix tries to avoid species.takes zofran PRN if acid reflex is worst   Depression - under lots of stress. Sees Therapy for counseling once a week for 6 weeks since it's through her work.She is out of work since 09/13/2021 due to depression.Also follows up with Psychiatry since 2 weeks ago.continue to follow up with psychiatry once a week.Has been hard to go   Left hip sciatic - takes Tizanidine follows up with [place].  Hyperlipidemia - on Lipitor states her diet is mostly salty foods .usually eats in portions.Has started eating more salads.    Past Medical History:  Diagnosis Date   Allergic rhinitis, cause  unspecified    Anemia, unspecified    Diabetes mellitus without complication ([place])    Esophageal reflux    Generalized anxiety disorder    Headache(784.0)    Hypertension    Pure hypercholesterolemia    Rash and other nonspecific skin eruption    Unspecified vitamin D deficiency    Past Surgical History:  Procedure Laterality Date   TENDON REPAIR  1990   left index finger    Allergies  Allergen Reactions   Codeine Phosphate     REACTION: vomiting    Allergies as of 11/20/2021       Reactions   Codeine Phosphate    REACTION: vomiting        Medication List        Accurate as of November 20, 2021  3:20 PM. If you have any questions, ask your nurse or doctor.          albuterol 108 (90 Base) MCG/ACT inhaler Commonly known as: VENTOLIN HFA INHALE 1-2 PUFFS BY MOUTH EVERY 6 HOURS AS NEEDED FOR WHEEZE OR SHORTNESS OF BREATH   amLODipine 5 MG tablet Commonly known as: NORVASC Take 1 tablet (5 mg total) by mouth daily.   atorvastatin 40 MG tablet Commonly known as: LIPITOR Take 1 tablet (40 mg total) by mouth daily at 6 PM.   BD Pen Needle Nano 2nd Gen 32G X 4 MM Misc Generic drug: Insulin Pen Needle USE WITH INSULIN PEN AS DIRECTED   blood glucose meter kit and supplies Kit Dispense based on  patient and insurance preference.  Use before breakfast and at bedtime. E11.4   dapagliflozin propanediol 5 MG Tabs tablet Commonly known as: Farxiga Take 1 tablet (5 mg total) by mouth daily before breakfast.   estradiol 0.025 mg/24hr patch Commonly known as: CLIMARA - Dosed in mg/24 hr Place 1 patch (0.025 mg total) onto the skin once a week. Needs to schedule mammogram for additional refills   fluticasone 50 MCG/ACT nasal spray Commonly known as: FLONASE Place 2 sprays into both nostrils daily.   ibuprofen 800 MG tablet Commonly known as: ADVIL TAKE 1 TABLET BY MOUTH EVERY 8 HOURS AS NEEDED   lisinopril 5 MG tablet Commonly known as: ZESTRIL Take 1  tablet (5 mg total) by mouth daily.   NovoLIN 70/30 Kwikpen (70-30) 100 UNIT/ML KwikPen Generic drug: insulin isophane & regular human KwikPen Inject 10 Units into the skin 2 (two) times daily with a meal.   ondansetron 4 MG tablet Commonly known as: ZOFRAN Take 1 tablet (4 mg total) by mouth every 6 (six) hours.   OneTouch Verio test strip Generic drug: glucose blood USE BEFORE BREAKFAST AND AT BEDTIME.   pantoprazole 20 MG tablet Commonly known as: PROTONIX TAKE 1 TABLET (20 MG TOTAL) BY MOUTH DAILY. NO ADDITIONAL REFILLS WITHOUT OFFICE VISIT What changed: Another medication with the same name was removed. Continue taking this medication, and follow the directions you see here. Changed by: Sandrea Hughs, NP   promethazine-dextromethorphan 6.25-15 MG/5ML syrup Commonly known as: PROMETHAZINE-DM Take 5 mLs by mouth 3 (three) times daily as needed for cough.   tiZANidine 4 MG tablet Commonly known as: ZANAFLEX Take 0.5-1 tablets (2-4 mg total) by mouth every 8 (eight) hours as needed for muscle spasms.   venlafaxine XR 150 MG 24 hr capsule Commonly known as: EFFEXOR-XR Take 1 capsule (150 mg total) by mouth daily with breakfast.        Review of Systems  Constitutional:  Negative for appetite change, chills, fatigue, fever and unexpected weight change.  HENT:  Negative for congestion, dental problem, ear discharge, ear pain, facial swelling, hearing loss, nosebleeds, postnasal drip, rhinorrhea, sinus pressure, sinus pain, sneezing, sore throat, tinnitus and trouble swallowing.   Eyes:  Negative for pain, discharge, redness, itching and visual disturbance.  Respiratory:  Negative for cough, chest tightness, shortness of breath and wheezing.   Cardiovascular:  Negative for chest pain, palpitations and leg swelling.  Gastrointestinal:  Negative for abdominal distention, abdominal pain, blood in stool, constipation, diarrhea, nausea and vomiting.  Endocrine: Negative for cold  intolerance, heat intolerance, polydipsia, polyphagia and polyuria.  Genitourinary:  Negative for difficulty urinating, dysuria, flank pain, frequency and urgency.  Musculoskeletal:  Positive for arthralgias. Negative for back pain, gait problem, joint swelling, myalgias, neck pain and neck stiffness.       Left sciatic pain   Skin:  Negative for color change, pallor, rash and wound.  Neurological:  Negative for dizziness, syncope, speech difficulty, weakness, light-headedness, numbness and headaches.  Hematological:  Does not bruise/bleed easily.  Psychiatric/Behavioral:  Negative for agitation, behavioral problems, confusion, hallucinations, self-injury, sleep disturbance and suicidal ideas. The patient is not nervous/anxious.        Worsening depression symptoms follows up with psychiatry and Therapist     Immunization History  Administered Date(s) Administered   Fluad Quad(high Dose 65+) 11/22/2020   Influenza Split 02/15/2011   Influenza Whole 11/09/2007, 12/01/2008   Influenza,inj,Quad PF,6+ Mos 10/21/2013, 03/22/2015, 10/17/2015, 03/12/2019   PFIZER(Purple Top)SARS-COV-2 Vaccination 06/03/2019, 09/07/2019  PNEUMOCOCCAL CONJUGATE-20 01/15/2021   Pneumococcal Polysaccharide-23 12/30/2016   Tdap 03/22/2015, 09/13/2017   Pertinent  Health Maintenance Due  Topic Date Due   OPHTHALMOLOGY EXAM  Never done   PAP SMEAR-Modifier  Never done   MAMMOGRAM  Never done   COLONOSCOPY (Pts 45-52yr Insurance coverage will need to be confirmed)  02/11/2018   HEMOGLOBIN A1C  05/23/2021   INFLUENZA VACCINE  09/11/2021   FOOT EXAM  11/22/2021      06/27/2019    4:59 AM 01/03/2021    5:05 PM 01/15/2021    9:33 AM 01/17/2021    4:06 PM 11/20/2021    3:14 PM  Fall Risk  Falls in the past year?   0  0  Was there an injury with Fall?   0  0  Fall Risk Category Calculator   0  0  Fall Risk Category   Low  Low  Patient Fall Risk Level Low fall risk Low fall risk  Low fall risk Low fall risk   Patient at Risk for Falls Due to     No Fall Risks  Fall risk Follow up     Falls evaluation completed   Functional Status Survey:    Vitals:   11/20/21 1515  BP: 130/78  Pulse: (!) 103  Resp: 16  Temp: (!) 97.3 F (36.3 C)  SpO2: 98%  Weight: 134 lb 3.2 oz (60.9 kg)  Height: _0  (1.575 m)   Body mass index is 24.55 kg/m. Physical Exam Vitals reviewed.  Constitutional:      General: She is not in acute distress.    Appearance: Normal appearance. She is normal weight. She is not ill-appearing or diaphoretic.  HENT:     Head: Normocephalic.     Right Ear: Tympanic membrane, ear canal and external ear normal. There is no impacted cerumen.     Left Ear: Tympanic membrane, ear canal and external ear normal. There is no impacted cerumen.     Nose: Nose normal. No congestion or rhinorrhea.     Mouth/Throat:     Mouth: Mucous membranes are moist.     Pharynx: Oropharynx is clear. No oropharyngeal exudate or posterior oropharyngeal erythema.  Eyes:     General: No scleral icterus.       Right eye: No discharge.        Left eye: No discharge.     Extraocular Movements: Extraocular movements intact.     Conjunctiva/sclera: Conjunctivae normal.     Pupils: Pupils are equal, round, and reactive to light.  Neck:     Vascular: No carotid bruit.  Cardiovascular:     Rate and Rhythm: Normal rate and regular rhythm.     Pulses: Normal pulses.     Heart sounds: Normal heart sounds. No murmur heard.    No friction rub. No gallop.  Pulmonary:     Effort: Pulmonary effort is normal. No respiratory distress.     Breath sounds: Normal breath sounds. No wheezing, rhonchi or rales.  Chest:     Chest wall: No tenderness.  Abdominal:     General: Bowel sounds are normal. There is no distension.     Palpations: Abdomen is soft. There is no mass.     Tenderness: There is no abdominal tenderness. There is no right CVA tenderness, left CVA tenderness, guarding or rebound.  Musculoskeletal:         General: No swelling or tenderness. Normal range of motion.     Cervical back:  Normal range of motion. No rigidity or tenderness.     Right lower leg: No edema.     Left lower leg: No edema.  Lymphadenopathy:     Cervical: No cervical adenopathy.  Skin:    General: Skin is warm and dry.     Coloration: Skin is not pale.     Findings: No bruising, erythema, lesion or rash.  Neurological:     Mental Status: She is alert and oriented to person, place, and time.     Cranial Nerves: No cranial nerve deficit.     Sensory: No sensory deficit.     Motor: No weakness.     Coordination: Coordination normal.     Gait: Gait normal.  Psychiatric:        Mood and Affect: Mood normal.        Speech: Speech normal.        Behavior: Behavior normal.        Thought Content: Thought content normal.        Judgment: Judgment normal.     Labs reviewed: Recent Labs    11/22/20 1032 01/17/21 1627  NA 138 131*  K 4.2 3.7  CL 100 97*  CO2 28 22  GLUCOSE 264* 364*  BUN 8 9  CREATININE 0.54 0.66  CALCIUM 9.8 9.0   Recent Labs    11/22/20 1032 01/17/21 1627  AST 15 13*  ALT 19 15  ALKPHOS 85 74  BILITOT 0.4 0.5  PROT 7.1 7.9  ALBUMIN 4.5 4.3   Recent Labs    11/22/20 1032 01/17/21 1627  WBC 7.6 11.1*  HGB 12.0 12.1  HCT 36.3 36.8  MCV 79.3 80.2  PLT 424.0* 442*   Lab Results  Component Value Date   TSH 0.87 03/12/2019   Lab Results  Component Value Date   HGBA1C 10.2 Repeated and verified X2. (H) 11/22/2020   Lab Results  Component Value Date   CHOL 210 (H) 03/12/2019   HDL 48.70 03/12/2019   LDLCALC 130 (H) 03/12/2019   LDLDIRECT 99.0 11/22/2020   TRIG 160.0 (H) 03/12/2019   CHOLHDL 4 03/12/2019    Significant Diagnostic Results in last 30 days:  No results found.  Assessment/Plan  1. Hot flashes due to menopause Continue on estradiol - estradiol (CLIMARA - DOSED IN MG/24 HR) 0.025 mg/24hr patch; Place 1 patch (0.025 mg total) onto the skin once a  week. Needs to schedule mammogram for additional refills  Dispense: 12 patch; Refill: 0  2. Type 2 diabetes mellitus with hyperglycemia, with long-term current use of insulin (HCC) Latest Hgb A1C 10.2 on chart done 11/22/2020 We will continue current insulin and check A1c then adjust as needed - insulin isophane & regular human KwikPen (NOVOLIN 70/30 KWIKPEN) (70-30) 100 UNIT/ML KwikPen; Inject 10 Units into the skin 2 (two) times daily with a meal.  Dispense: 15 mL; Refill: 0 - Hemoglobin A1c - Microalbumin / creatinine urine ratio  3. Encounter to establish care Available records reviewed, recommended scheduling for fasting blood work.  Will obtain final records for immunization then update chart.  4. Essential hypertension Blood pressure well controlled -Continue on lisinopril and amlodipine - TSH - COMPLETE METABOLIC PANEL WITH GFR - CBC with Differential/Platelet  5. Gastroesophageal reflux disease with esophagitis without hemorrhage Symptoms controlled. No recent H/H for review.No tarry or black stool  - advised to avoid eating meals late in the evening and to avoid aggravating foods and spices. - continue on Pantoprazole.  - CBC  with Differential/Platelet  6. Hyperlipidemia LDL goal <100 No latest LDL for review -Continue with dietary modification and exercise - Lipid panel  7. Current mild episode of major depressive disorder, unspecified whether recurrent (HCC) Mood stable -Continue on Effexor - ER  8. Sciatic pain, left Continue on ibuprofen as needed and tizanidine - ibuprofen (ADVIL) 800 MG tablet; Take 1 tablet (800 mg total) by mouth every 8 (eight) hours as needed.  Dispense: 30 tablet; Refill: 0  9. Breast cancer screening by mammogram Asymptomatic - MM DIGITAL SCREENING BILATERAL; Future  10. Colon cancer screening Asymptomatic - Ambulatory referral to Gastroenterology  11. Screen for STD (sexually transmitted disease) Reports no high risk behaviors -  HIV Antibody (routine testing w rflx)  12. Need for influenza vaccination Afebrile Flut shot administered by CMA no acute reaction reported.  - Flu Vaccine QUAD High Dose(Fluad)  Family/ staff Communication: Reviewed plan of care with patient verbalized understanding   Labs/tests ordered:  - CBC with Differential/Platelet - CMP with eGFR([facility]) - TSH - Hgb A1C - Lipid panel - HIV Antibody (routine testing w rflx)   Next Appointment : Return in about 4 months (around 03/23/2022) for medical management of chronic issues.Pap smear in one month .   Sandrea Hughs, NP

## 2021-11-21 LAB — CBC WITH DIFFERENTIAL/PLATELET
Absolute Monocytes: 323 cells/uL (ref 200–950)
Basophils Absolute: 99 cells/uL (ref 0–200)
Basophils Relative: 1.5 %
Eosinophils Absolute: 363 cells/uL (ref 15–500)
Eosinophils Relative: 5.5 %
HCT: 39.9 % (ref 35.0–45.0)
Hemoglobin: 12.5 g/dL (ref 11.7–15.5)
Lymphs Abs: 2330 cells/uL (ref 850–3900)
MCH: 25.9 pg — ABNORMAL LOW (ref 27.0–33.0)
MCHC: 31.3 g/dL — ABNORMAL LOW (ref 32.0–36.0)
MCV: 82.6 fL (ref 80.0–100.0)
MPV: 11.4 fL (ref 7.5–12.5)
Monocytes Relative: 4.9 %
Neutro Abs: 3485 cells/uL (ref 1500–7800)
Neutrophils Relative %: 52.8 %
Platelets: 444 10*3/uL — ABNORMAL HIGH (ref 140–400)
RBC: 4.83 10*6/uL (ref 3.80–5.10)
RDW: 11.8 % (ref 11.0–15.0)
Total Lymphocyte: 35.3 %
WBC: 6.6 10*3/uL (ref 3.8–10.8)

## 2021-11-21 LAB — COMPLETE METABOLIC PANEL WITH GFR
AG Ratio: 1.5 (calc) (ref 1.0–2.5)
ALT: 13 U/L (ref 6–29)
AST: 10 U/L (ref 10–35)
Albumin: 4.6 g/dL (ref 3.6–5.1)
Alkaline phosphatase (APISO): 75 U/L (ref 37–153)
BUN: 11 mg/dL (ref 7–25)
CO2: 28 mmol/L (ref 20–32)
Calcium: 10.4 mg/dL (ref 8.6–10.4)
Chloride: 99 mmol/L (ref 98–110)
Creat: 0.63 mg/dL (ref 0.50–1.03)
Globulin: 3 g/dL (calc) (ref 1.9–3.7)
Glucose, Bld: 233 mg/dL — ABNORMAL HIGH (ref 65–139)
Potassium: 4.8 mmol/L (ref 3.5–5.3)
Sodium: 137 mmol/L (ref 135–146)
Total Bilirubin: 0.5 mg/dL (ref 0.2–1.2)
Total Protein: 7.6 g/dL (ref 6.1–8.1)
eGFR: 103 mL/min/{1.73_m2} (ref >=60)

## 2021-11-21 LAB — LIPID PANEL
Cholesterol: 165 mg/dL (ref <200)
HDL: 50 mg/dL (ref >=50)
LDL Cholesterol (Calc): 98 mg/dL (calc)
Non-HDL Cholesterol (Calc): 115 mg/dL (calc) (ref <130)
Total CHOL/HDL Ratio: 3.3 (calc) (ref <5.0)
Triglycerides: 82 mg/dL (ref <150)

## 2021-11-21 LAB — HEMOGLOBIN A1C
Hgb A1c MFr Bld: 8.7 % of total Hgb — ABNORMAL HIGH (ref <5.7)
Mean Plasma Glucose: 203 mg/dL
eAG (mmol/L): 11.2 mmol/L

## 2021-11-21 LAB — HIV ANTIBODY (ROUTINE TESTING W REFLEX): HIV 1&2 Ab, 4th Generation: NONREACTIVE

## 2021-11-21 LAB — MICROALBUMIN / CREATININE URINE RATIO
Creatinine, Urine: 195 mg/dL (ref 20–275)
Microalb Creat Ratio: 13 mcg/mg creat (ref <30)
Microalb, Ur: 2.5 mg/dL

## 2021-11-21 LAB — TSH: TSH: 0.98 mIU/L (ref 0.40–4.50)

## 2021-11-29 ENCOUNTER — Other Ambulatory Visit: Payer: Self-pay | Admitting: Nurse Practitioner

## 2021-11-29 DIAGNOSIS — Z794 Long term (current) use of insulin: Secondary | ICD-10-CM

## 2021-11-29 DIAGNOSIS — F331 Major depressive disorder, recurrent, moderate: Secondary | ICD-10-CM

## 2021-11-29 DIAGNOSIS — F411 Generalized anxiety disorder: Secondary | ICD-10-CM

## 2021-11-29 DIAGNOSIS — I10 Essential (primary) hypertension: Secondary | ICD-10-CM

## 2021-11-29 DIAGNOSIS — E7800 Pure hypercholesterolemia, unspecified: Secondary | ICD-10-CM

## 2021-12-10 NOTE — Telephone Encounter (Signed)
Please Ask Team [facility] to refax paperwork with requested dates.

## 2021-12-10 NOTE — Telephone Encounter (Signed)
Message routed to PCP Ngetich, Dinah C, NP  

## 2021-12-10 NOTE — Telephone Encounter (Signed)
MyChart message sent to patient.

## 2021-12-11 NOTE — Telephone Encounter (Signed)
Form Received and corrected.  Placed in Dinah's folder to review and sign.

## 2021-12-11 NOTE — Telephone Encounter (Signed)
Form signed given to [facility] to refax.

## 2021-12-21 NOTE — Telephone Encounter (Signed)
Patient called regarding form and stated that there is suppose to be a different form. She states that she will print off new form and bring it to her upcoming appointment 11/15.

## 2021-12-24 ENCOUNTER — Other Ambulatory Visit: Payer: Self-pay | Admitting: Nurse Practitioner

## 2021-12-24 DIAGNOSIS — E1165 Type 2 diabetes mellitus with hyperglycemia: Secondary | ICD-10-CM

## 2021-12-25 NOTE — Telephone Encounter (Signed)
Form was left on CI desk. Form Given to [facility] for appointment for tomorrow 12/26/2021 with Dinah to fill out correctly at appointment.

## 2021-12-26 ENCOUNTER — Ambulatory Visit: Payer: BC Managed Care – PPO | Admitting: Family

## 2021-12-26 ENCOUNTER — Encounter: Payer: Self-pay | Admitting: Family

## 2021-12-26 VITALS — BP 120/80 | HR 88 | Temp 96.7°F | Ht 62.0 in | Wt 139.4 lb

## 2021-12-26 DIAGNOSIS — Z794 Long term (current) use of insulin: Secondary | ICD-10-CM | POA: Diagnosis not present

## 2021-12-26 DIAGNOSIS — E1165 Type 2 diabetes mellitus with hyperglycemia: Secondary | ICD-10-CM | POA: Diagnosis not present

## 2021-12-26 DIAGNOSIS — Z124 Encounter for screening for malignant neoplasm of cervix: Secondary | ICD-10-CM | POA: Diagnosis not present

## 2021-12-26 LAB — HM PAP SMEAR: HM Pap smear: NORMAL

## 2021-12-26 MED ORDER — OZEMPIC (1 MG/DOSE) 4 MG/3ML ~~LOC~~ SOPN: 0.5000 mg | PEN_INJECTOR | SUBCUTANEOUS | 0 refills | Status: DC

## 2021-12-26 NOTE — Progress Notes (Signed)
Provider: Marlowe Sax FNP-C  Naithan Delage, Nelda Bucks, NP  Patient Care Team: Hatsuko Bizzarro, Nelda Bucks, NP as PCP - General (Family Medicine)  Extended Emergency Contact Information Primary Emergency Contact: Gaither,Gladys Address: [address]          [place], [place] [address] [place] Phone: [phone] Relation: Mother Secondary Emergency Contact: [place] Mobile Phone: [phone] Relation: Brother  Code Status:  Full Code  Goals of care: Advanced Directive information    11/20/2021    3:14 PM  Advanced Directives  Does Patient Have a Medical Advance Directive? No  Would patient like information on creating a medical advance directive? No - Patient declined     Chief Complaint  Patient presents with   Medical Management of Chronic Issues    Patient in office today for pap smear.    Immunizations    Shingrix vaccine and COVID booster due   Quality Metric Gaps    Eye exam,colonoscopy, foot exam and mammogram due    HPI:  Pt is a 56 y.o. female seen today for an acute visit for pap smear.she denies any abd pain or discharge.   Also states would like FMLA paperwork filled due to depression.sees Psychiatry service twice per week and counselor once per week for depression and increased stress level.denies SI   Blood sugars running in the 200's - 300's latest Hgb A1C was 8.5 would like to try either trulicity or Ozempic to help lower her blood sugar.discussed use of both medication prefers Ozempic.   Mammogram ordered on previous visit.  Past Medical History:  Diagnosis Date   Allergic rhinitis, cause unspecified    Anemia, unspecified    Diabetes mellitus without complication (HCC)    Esophageal reflux    Generalized anxiety disorder    Headache(784.0)    Hypertension    Pure hypercholesterolemia    Rash and other nonspecific skin eruption    Unspecified vitamin D deficiency    Past Surgical History:  Procedure Laterality Date   TENDON  REPAIR  1990   left index finger    Allergies  Allergen Reactions   Codeine Phosphate     REACTION: vomiting    Outpatient Encounter Medications as of 12/26/2021  Medication Sig   amLODipine (NORVASC) 5 MG tablet Take 1 tablet (5 mg total) by mouth daily.   atorvastatin (LIPITOR) 40 MG tablet Take 1 tablet (40 mg total) by mouth daily at 6 PM.   blood glucose meter kit and supplies KIT Dispense based on patient and insurance preference.  Use before breakfast and at bedtime. E11.4   dapagliflozin propanediol (FARXIGA) 5 MG TABS tablet Take 1 tablet (5 mg total) by mouth daily before breakfast.   estradiol (CLIMARA - DOSED IN MG/24 HR) 0.025 mg/24hr patch Place 1 patch (0.025 mg total) onto the skin once a week. Needs to schedule mammogram for additional refills   fluticasone (FLONASE) 50 MCG/ACT nasal spray Place 2 sprays into both nostrils daily.   ibuprofen (ADVIL) 800 MG tablet Take 1 tablet (800 mg total) by mouth every 8 (eight) hours as needed.   insulin isophane & regular human KwikPen (NOVOLIN 70/30 KWIKPEN) (70-30) 100 UNIT/ML KwikPen Inject 10 Units into the skin 2 (two) times daily with a meal.   Insulin Pen Needle (BD PEN NEEDLE NANO 2ND GEN) 32G X 4 MM MISC USE WITH INSULIN PEN AS DIRECTED   lisinopril (ZESTRIL) 5 MG tablet Take 1 tablet (5 mg total) by mouth daily.  ondansetron (ZOFRAN) 4 MG tablet Take 1 tablet (4 mg total) by mouth every 6 (six) hours.   ONETOUCH VERIO test strip USE BEFORE BREAKFAST AND AT BEDTIME.   pantoprazole (PROTONIX) 20 MG tablet TAKE 1 TABLET (20 MG TOTAL) BY MOUTH DAILY. NO ADDITIONAL REFILLS WITHOUT OFFICE VISIT   tiZANidine (ZANAFLEX) 4 MG tablet Take 0.5-1 tablets (2-4 mg total) by mouth every 8 (eight) hours as needed for muscle spasms.   venlafaxine XR (EFFEXOR-XR) 150 MG 24 hr capsule Take 1 capsule (150 mg total) by mouth daily with breakfast.   [DISCONTINUED] omeprazole (PRILOSEC OTC) 20 MG tablet Take 1 tablet (20 mg total) by mouth  daily.   No facility-administered encounter medications on file as of 12/26/2021.    Review of Systems  Constitutional:  Negative for appetite change, chills, fatigue, fever and unexpected weight change.  Eyes:  Negative for pain, discharge, redness, itching and visual disturbance.  Respiratory:  Negative for cough, chest tightness, shortness of breath and wheezing.   Cardiovascular:  Negative for chest pain, palpitations and leg swelling.  Endocrine: Negative for cold intolerance, heat intolerance, polydipsia, polyphagia and polyuria.  Genitourinary:  Negative for difficulty urinating, dysuria, flank pain, frequency and urgency.  Musculoskeletal:  Negative for arthralgias, back pain, gait problem, joint swelling, myalgias, neck pain and neck stiffness.  Skin:  Negative for color change, pallor and rash.  Neurological:  Negative for dizziness, syncope, speech difficulty, weakness, light-headedness, numbness and headaches.  Hematological:  Does not bruise/bleed easily.  Psychiatric/Behavioral:  Negative for agitation, behavioral problems, confusion, hallucinations, self-injury, sleep disturbance and suicidal ideas. The patient is not nervous/anxious.        Depression and increased stress level     Immunization History  Administered Date(s) Administered   Fluad Quad(high Dose 65+) 11/22/2020, 11/20/2021   Influenza Split 02/15/2011   Influenza Whole 11/09/2007, 12/01/2008   Influenza,inj,Quad PF,6+ Mos 10/21/2013, 03/22/2015, 10/17/2015, 03/12/2019   PFIZER(Purple Top)SARS-COV-2 Vaccination 06/03/2019, 09/07/2019   PNEUMOCOCCAL CONJUGATE-20 01/15/2021   Pneumococcal Polysaccharide-23 12/30/2016   Tdap 03/22/2015, 09/13/2017   Pertinent  Health Maintenance Due  Topic Date Due   OPHTHALMOLOGY EXAM  Never done   PAP SMEAR-Modifier  Never done   MAMMOGRAM  Never done   COLONOSCOPY (Pts 45-99yr Insurance coverage will need to be confirmed)  02/11/2018   FOOT EXAM  11/22/2021    HEMOGLOBIN A1C  05/22/2022   INFLUENZA VACCINE  Completed      01/03/2021    5:05 PM 01/15/2021    9:33 AM 01/17/2021    4:06 PM 11/20/2021    3:14 PM 12/26/2021   11:10 AM  [place] the past year?  0  0 0  Was there an injury with Fall?  0  0 0  Fall Risk Category Calculator  0  0 0  Fall Risk Category  Low  Low Low  Patient Fall Risk Level Low fall risk  Low fall risk Low fall risk Low fall risk  Patient at Risk for Falls Due to    No Fall Risks No Fall Risks  Fall risk Follow up    Falls evaluation completed Falls evaluation completed   Functional Status Survey:    Vitals:   12/26/21 1113  BP: 120/80  Pulse: 88  Temp: (!) 96.7 F (35.9 C)  SpO2: 98%  Weight: 139 lb 6.4 oz (63.2 kg)  Height: _0  (1.575 m)   Body mass index is 25.5 kg/m. Physical Exam Vitals reviewed. Exam conducted with  a chaperone present [employer],CMA).  Constitutional:      General: She is not in acute distress.    Appearance: Normal appearance. She is overweight. She is not ill-appearing or diaphoretic.  HENT:     Head: Normocephalic.     Mouth/Throat:     Mouth: Mucous membranes are moist.     Pharynx: Oropharynx is clear. No oropharyngeal exudate or posterior oropharyngeal erythema.  Eyes:     General: No scleral icterus.       Right eye: No discharge.        Left eye: No discharge.     Conjunctiva/sclera: Conjunctivae normal.     Pupils: Pupils are equal, round, and reactive to light.  Neck:     Vascular: No carotid bruit.  Cardiovascular:     Rate and Rhythm: Normal rate and regular rhythm.     Pulses: Normal pulses.     Heart sounds: Normal heart sounds. No murmur heard.    No friction rub. No gallop.  Pulmonary:     Effort: Pulmonary effort is normal. No respiratory distress.     Breath sounds: Normal breath sounds. No wheezing, rhonchi or rales.  Chest:     Chest wall: No tenderness.  Abdominal:     General: Bowel sounds are normal. There is no distension.      Palpations: Abdomen is soft. There is no mass.     Tenderness: There is no abdominal tenderness. There is no right CVA tenderness, left CVA tenderness, guarding or rebound.  Genitourinary:    Exam position: Lithotomy position.     Vagina: Normal.     Cervix: Normal.     Uterus: Normal.      Adnexa: Right adnexa normal and left adnexa normal.  Musculoskeletal:        General: No swelling or tenderness. Normal range of motion.     Cervical back: Normal range of motion. No rigidity or tenderness.     Right lower leg: No edema.     Left lower leg: No edema.  Lymphadenopathy:     Cervical: No cervical adenopathy.  Skin:    General: Skin is warm and dry.     Coloration: Skin is not pale.     Findings: No bruising, erythema, lesion or rash.  Neurological:     Mental Status: She is alert and oriented to person, place, and time.     Gait: Gait normal.  Psychiatric:        Mood and Affect: Mood is depressed.        Speech: Speech normal.        Behavior: Behavior normal.        Thought Content: Thought content normal.        Judgment: Judgment normal.     Labs reviewed: Recent Labs    01/17/21 1627 11/20/21 1611  NA 131* 137  K 3.7 4.8  CL 97* 99  CO2 22 28  GLUCOSE 364* 233*  BUN 9 11  CREATININE 0.66 0.63  CALCIUM 9.0 10.4   Recent Labs    01/17/21 1627 11/20/21 1611  AST 13* 10  ALT 15 13  ALKPHOS 74  --   BILITOT 0.5 0.5  PROT 7.9 7.6  ALBUMIN 4.3  --    Recent Labs    01/17/21 1627 11/20/21 1611  WBC 11.1* 6.6  NEUTROABS  --  3,485  HGB 12.1 12.5  HCT 36.8 39.9  MCV 80.2 82.6  PLT 442* 444*   Lab  Results  Component Value Date   TSH 0.98 11/20/2021   Lab Results  Component Value Date   HGBA1C 8.7 (H) 11/20/2021   Lab Results  Component Value Date   CHOL 165 11/20/2021   HDL 50 11/20/2021   LDLCALC 98 11/20/2021   LDLDIRECT 99.0 11/22/2020   TRIG 82 11/20/2021   CHOLHDL 3.3 11/20/2021    Significant Diagnostic Results in last 30 days:   No results found.  Assessment/Plan 1. Cervical cancer screening Chaperone present throughout pelvic exam  No tenderness or bleeding noted  - PAP, Image Guided [[facility]]  2. Type 2 diabetes mellitus with hyperglycemia, with long-term current use of insulin (HCC) Lab Results  Component Value Date   HGBA1C 8.7 (H) 11/20/2021  - continue on Novolin  - continue to monitor CBG twice daily   Family/ staff Communication: Reviewed plan of care with patient verbalized understanding   Labs/tests ordered: - PAP, Image Guided [[facility]]  Next Appointment: Return in about 1 month (around 01/25/2022) for Blood sugar .   Sandrea Hughs, NP

## 2021-12-27 ENCOUNTER — Other Ambulatory Visit: Payer: Self-pay | Admitting: Nurse Practitioner

## 2021-12-27 DIAGNOSIS — E1165 Type 2 diabetes mellitus with hyperglycemia: Secondary | ICD-10-CM

## 2021-12-27 LAB — PAP IG (IMAGE GUIDED)

## 2021-12-28 ENCOUNTER — Other Ambulatory Visit: Payer: Self-pay | Admitting: Family

## 2021-12-28 ENCOUNTER — Telehealth: Payer: Self-pay | Admitting: Family

## 2021-12-28 DIAGNOSIS — Z794 Long term (current) use of insulin: Secondary | ICD-10-CM

## 2021-12-28 NOTE — Telephone Encounter (Signed)
Noted  

## 2021-12-28 NOTE — Telephone Encounter (Signed)
Paper work was given to [employer] to be faxed and she called patient to come sign FLMA forms before they are faxed to company.

## 2021-12-28 NOTE — Telephone Encounter (Signed)
FMLA paper work Completed and signed.Please Fax to [employer] # [id number]  Tel# [phone] as requested.

## 2021-12-28 NOTE — Telephone Encounter (Signed)
Pharmacy comment: Script Clarification:THE 1 MG PEN CANNOT DISPENSE 0.5 MG DOSE. SHOULD WE CHANGE THE DIRECTIONS FOR THE DOSAGE? THANKS.

## 2021-12-28 NOTE — Telephone Encounter (Signed)
Ozempic changed to 0.5 mg

## 2021-12-29 IMAGING — DX DG KNEE AP/LAT W/ SUNRISE*L*
3 series · 3 of 3 positions shown · non-contrast
Comparison: None.

CLINICAL DATA: Knee pain

EXAM:
LEFT KNEE 3 VIEWS

[knee ap]
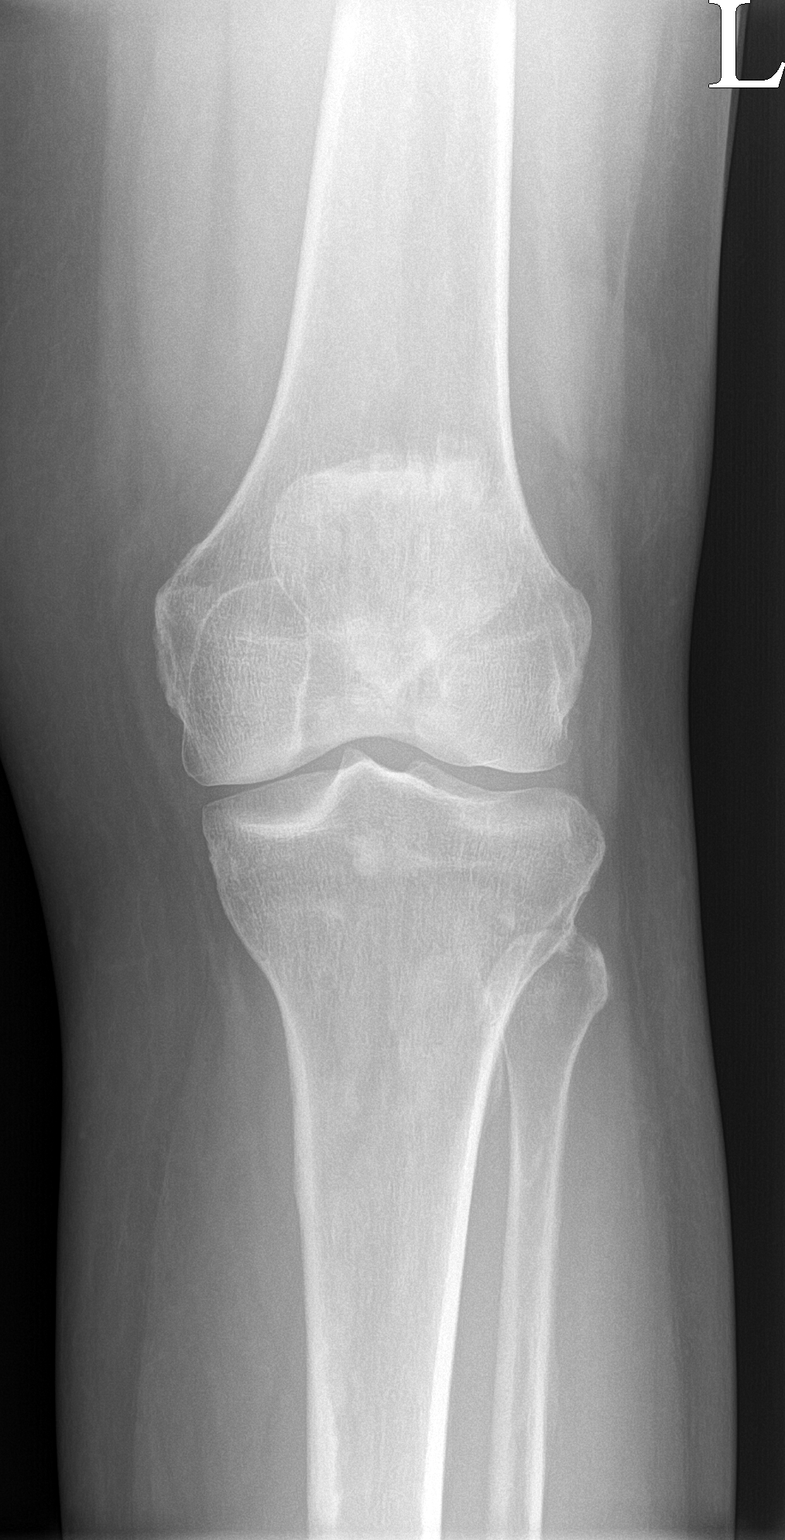

[knee lat]
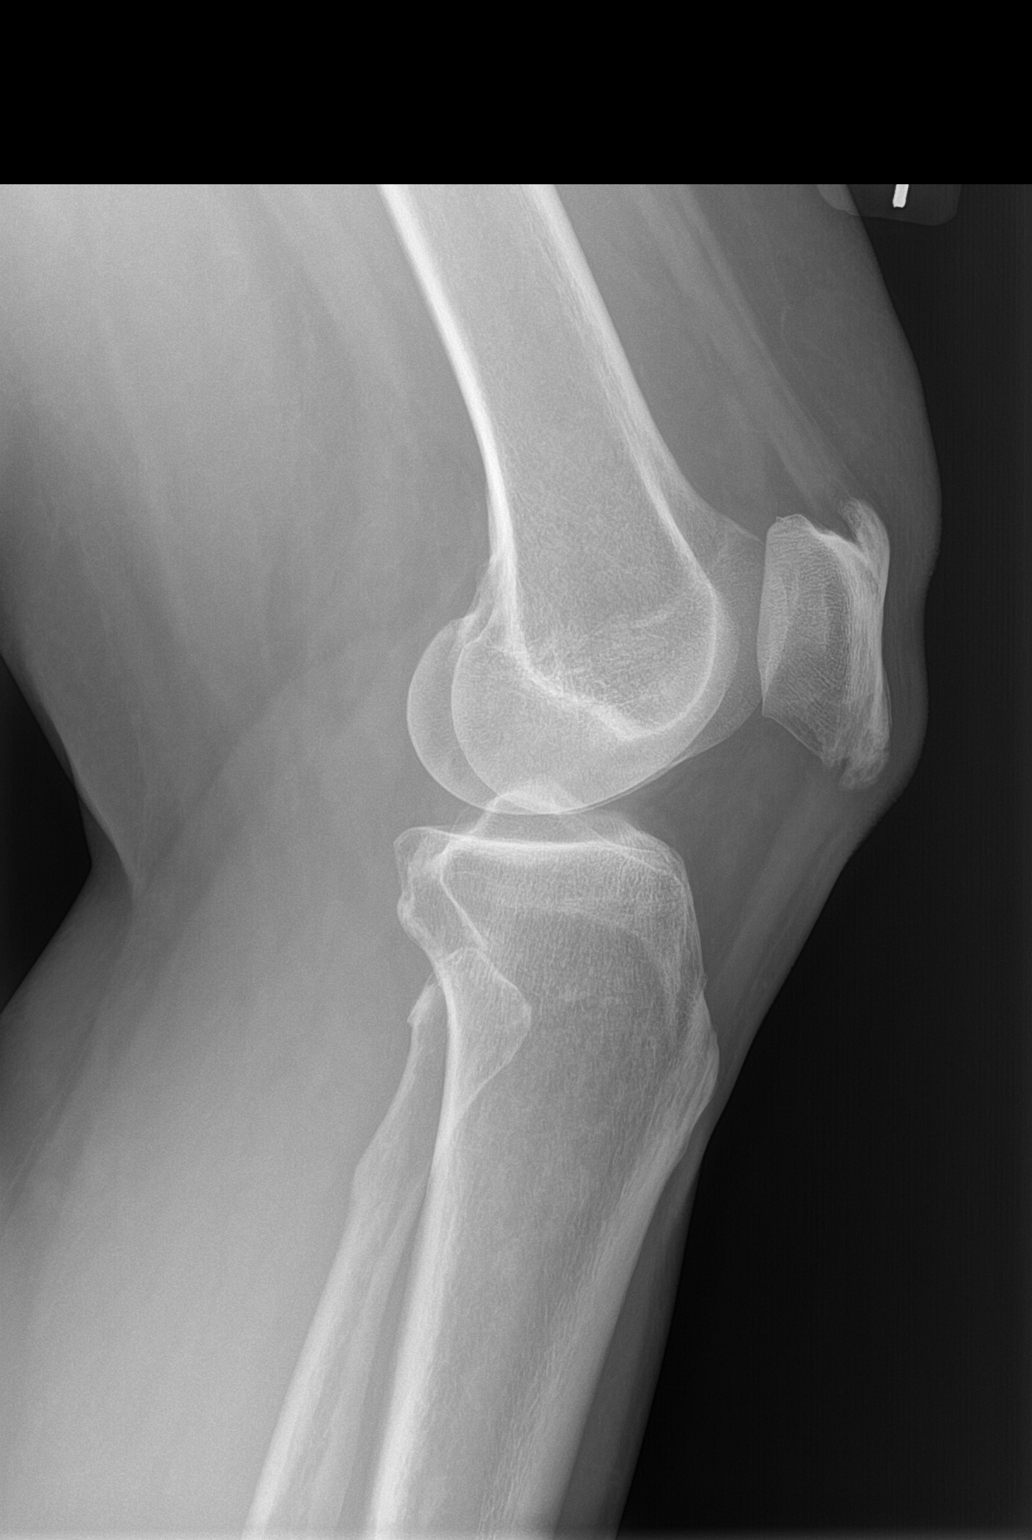

[patella]
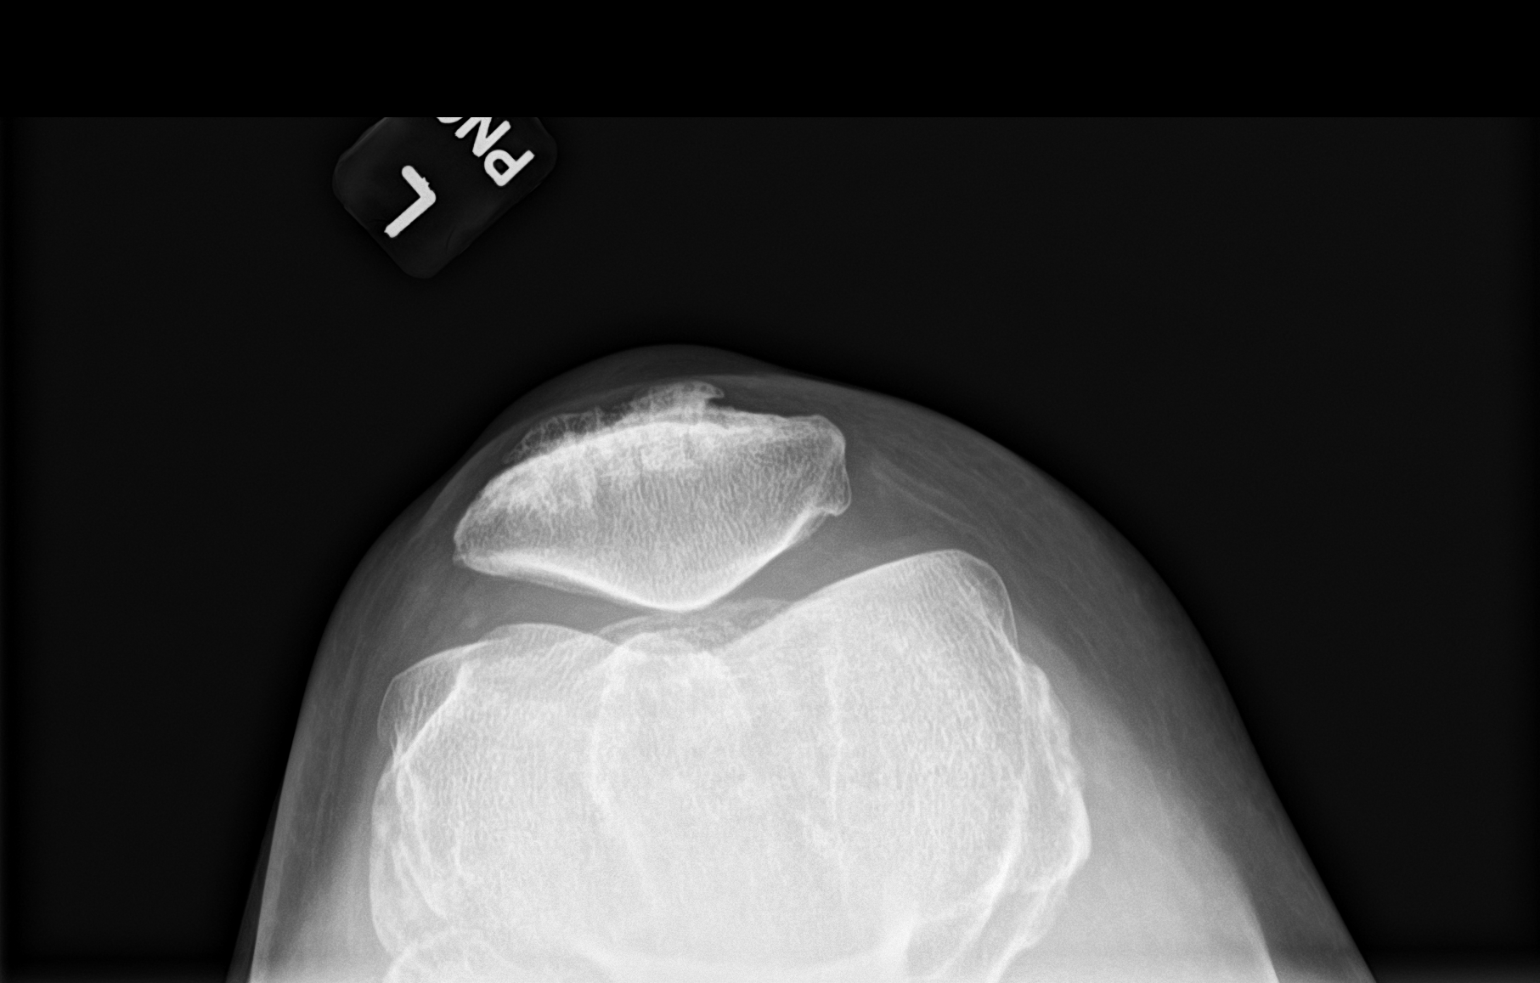

[3 of 3 positions shown; findings below may reference images not displayed]

FINDINGS: No fracture or malalignment. The joint spaces appear patent. Trace
knee effusion. Prominent patellar enthesophytes.
IMPRESSION: Small knee effusion.  No acute osseous abnormality.

## 2021-12-31 ENCOUNTER — Telehealth: Payer: Self-pay

## 2021-12-31 NOTE — Telephone Encounter (Signed)
FMLA and Team [employer] form completed

## 2021-12-31 NOTE — Telephone Encounter (Signed)
Noted  

## 2021-12-31 NOTE — Telephone Encounter (Signed)
Patient called stating she was seen on 12/26/21 and Webb Silversmith was to complete a Team [employer] Form. Patient would like to know the status.   Patient emphasized that this paperwork is different from the [facility] paperwork.   Please advise

## 2021-12-31 NOTE — Telephone Encounter (Signed)
I left a detailed message informing patient of Dinah's response.

## 2022-01-02 NOTE — Telephone Encounter (Signed)
Please fax visit notes as requested.

## 2022-01-02 NOTE — Telephone Encounter (Signed)
Will have staff fax requested office notes.

## 2022-01-11 ENCOUNTER — Telehealth: Payer: Self-pay | Admitting: *Deleted

## 2022-01-11 NOTE — Telephone Encounter (Signed)
Paperwork signed and faxed back to [employer] as requested.

## 2022-01-11 NOTE — Telephone Encounter (Signed)
Received [employer] FMLA Paperwork from [place] with [employer] [phone] Fax:[phone].   LMS Leave YWXIPP:[id number]  Placed paperwork in Dinah's folder to review and sign.

## 2022-01-24 NOTE — Telephone Encounter (Signed)
Form Printed and placed in Dinah's folder for review.

## 2022-01-25 ENCOUNTER — Telehealth: Payer: BC Managed Care – PPO | Admitting: Family

## 2022-01-25 NOTE — Telephone Encounter (Signed)
I have called patient twice but no response and unable t leave voicemail on her phone " state call cannot be completed at this time".Please call patient complete [employer] form need the following information :  Actual return date to work Or Estimated return date to work.Then fax form as requested.

## 2022-01-29 NOTE — Telephone Encounter (Signed)
Tried calling patient and unable to leave voicemail.

## 2022-02-02 ENCOUNTER — Other Ambulatory Visit: Payer: Self-pay | Admitting: Family

## 2022-02-02 DIAGNOSIS — E1165 Type 2 diabetes mellitus with hyperglycemia: Secondary | ICD-10-CM

## 2022-02-02 DIAGNOSIS — M5432 Sciatica, left side: Secondary | ICD-10-CM

## 2022-02-05 NOTE — Telephone Encounter (Signed)
Tried calling patient, LMOM to return call.

## 2022-02-07 DIAGNOSIS — F331 Major depressive disorder, recurrent, moderate: Secondary | ICD-10-CM

## 2022-02-07 DIAGNOSIS — Z794 Long term (current) use of insulin: Secondary | ICD-10-CM

## 2022-02-07 DIAGNOSIS — F411 Generalized anxiety disorder: Secondary | ICD-10-CM

## 2022-02-07 DIAGNOSIS — E7800 Pure hypercholesterolemia, unspecified: Secondary | ICD-10-CM

## 2022-02-07 DIAGNOSIS — K219 Gastro-esophageal reflux disease without esophagitis: Secondary | ICD-10-CM

## 2022-02-07 DIAGNOSIS — I10 Essential (primary) hypertension: Secondary | ICD-10-CM

## 2022-02-07 MED ORDER — LISINOPRIL 5 MG PO TABS: 5.0000 mg | ORAL_TABLET | Freq: Every day | ORAL | 1 refills | Status: DC

## 2022-02-07 MED ORDER — ATORVASTATIN CALCIUM 40 MG PO TABS: 40.0000 mg | ORAL_TABLET | Freq: Every day | ORAL | 1 refills | Status: DC

## 2022-02-07 MED ORDER — AMLODIPINE BESYLATE 5 MG PO TABS: 5.0000 mg | ORAL_TABLET | Freq: Every day | ORAL | 1 refills | Status: DC

## 2022-02-07 MED ORDER — PANTOPRAZOLE SODIUM 20 MG PO TBEC: 20.0000 mg | DELAYED_RELEASE_TABLET | Freq: Every day | ORAL | 1 refills | Status: DC

## 2022-02-07 MED ORDER — VENLAFAXINE HCL ER 150 MG PO CP24: 150.0000 mg | ORAL_CAPSULE | Freq: Every day | ORAL | 1 refills | Status: DC

## 2022-02-07 NOTE — Telephone Encounter (Addendum)
My Estimated time because I'm still in therapy is Jan 29th 2024 per patient in a [employer].   Form given to Dinah to approve and complete.

## 2022-02-07 NOTE — Telephone Encounter (Signed)
Medication refilled as requested.

## 2022-02-07 NOTE — Telephone Encounter (Signed)
Pended medication and sent to Dinah for approval.   Short Term Disability Form placed in Dinah's folder to review

## 2022-02-18 ENCOUNTER — Encounter: Payer: BC Managed Care – PPO | Admitting: Family

## 2022-02-18 NOTE — Progress Notes (Signed)
  This encounter was created in error - please disregard. No show 

## 2022-02-20 ENCOUNTER — Telehealth: Payer: Self-pay

## 2022-02-20 NOTE — Telephone Encounter (Signed)
Patient called inquiring about disability paperwork. Patient had stated that form had not been faxed. The form was still on Dinah's desk waiting on return to work date (form was signed). Per message by Rodena Piety, CMA on 01/25/22 patient sent mychart message stating that her return to work date will be Jan. 29thy. Form had not been updated with date. When speaking with patient she stated that her return to work date will be Jan. 30,2024. I updated form and faxed form. Form faxed and placed inpurple hanging folder in Clinical intake for 1 week and then to be sent to scanning.

## 2022-03-12 NOTE — Telephone Encounter (Signed)
Form completed and signed placed on clinical intake desk to be faxed or picked up.

## 2022-03-15 ENCOUNTER — Other Ambulatory Visit: Payer: Self-pay | Admitting: Family

## 2022-03-15 DIAGNOSIS — N951 Menopausal and female climacteric states: Secondary | ICD-10-CM

## 2022-03-15 NOTE — Telephone Encounter (Signed)
Script send to pharmacy

## 2022-03-18 ENCOUNTER — Encounter: Payer: BC Managed Care – PPO | Admitting: Family

## 2022-03-20 ENCOUNTER — Telehealth: Payer: Self-pay | Admitting: Family

## 2022-03-20 NOTE — Telephone Encounter (Signed)
Form received from [employer] a [place] for short term disability claim estimated return to work date indicated as 04/15/2022. Form completed and signed.please fax as requested.

## 2022-03-21 NOTE — Telephone Encounter (Signed)
Short Term Disability Form received and faxed back to [facility] Fax:[phone] as requested.   Employer: [employer]

## 2022-04-01 ENCOUNTER — Encounter: Payer: BC Managed Care – PPO | Admitting: Orthopedic Surgery

## 2022-04-02 NOTE — Progress Notes (Signed)
This encounter was created in error - please disregard.

## 2022-04-04 NOTE — Telephone Encounter (Signed)
Hi Brenda [name],  This is a medical records request

## 2022-04-15 NOTE — Telephone Encounter (Signed)
Will need to check with administration staff.I will sign the paper work then give to staff to fax.

## 2022-04-15 NOTE — Telephone Encounter (Signed)
Can we still see the patient or would you prefer she jut reschedule appointment. She has paperwork that I have printed out and placed on your desk.  Message routed to Marlowe Sax, NP

## 2022-04-16 ENCOUNTER — Ambulatory Visit: Payer: BC Managed Care – PPO | Admitting: Family

## 2022-04-18 ENCOUNTER — Encounter: Payer: Self-pay | Admitting: Family

## 2022-04-18 ENCOUNTER — Telehealth (INDEPENDENT_AMBULATORY_CARE_PROVIDER_SITE_OTHER): Payer: BC Managed Care – PPO | Admitting: Family

## 2022-04-18 VITALS — BP 162/86 | HR 116 | Ht 62.0 in | Wt 131.0 lb

## 2022-04-18 DIAGNOSIS — Z636 Dependent relative needing care at home: Secondary | ICD-10-CM

## 2022-04-18 DIAGNOSIS — F411 Generalized anxiety disorder: Secondary | ICD-10-CM | POA: Diagnosis not present

## 2022-04-18 DIAGNOSIS — E1165 Type 2 diabetes mellitus with hyperglycemia: Secondary | ICD-10-CM

## 2022-04-18 DIAGNOSIS — Z794 Long term (current) use of insulin: Secondary | ICD-10-CM

## 2022-04-18 DIAGNOSIS — E7800 Pure hypercholesterolemia, unspecified: Secondary | ICD-10-CM | POA: Diagnosis not present

## 2022-04-18 DIAGNOSIS — F331 Major depressive disorder, recurrent, moderate: Secondary | ICD-10-CM | POA: Diagnosis not present

## 2022-04-18 DIAGNOSIS — I10 Essential (primary) hypertension: Secondary | ICD-10-CM | POA: Diagnosis not present

## 2022-04-18 MED ORDER — BUSPIRONE HCL 5 MG PO TABS: 5.0000 mg | ORAL_TABLET | Freq: Two times a day (BID) | ORAL | 3 refills | Status: DC

## 2022-04-18 MED ORDER — LISINOPRIL 5 MG PO TABS: 5.0000 mg | ORAL_TABLET | Freq: Every day | ORAL | 1 refills | Status: DC

## 2022-04-18 MED ORDER — AMLODIPINE BESYLATE 5 MG PO TABS: 5.0000 mg | ORAL_TABLET | Freq: Every day | ORAL | 1 refills | Status: DC

## 2022-04-18 MED ORDER — VENLAFAXINE HCL ER 150 MG PO CP24: 150.0000 mg | ORAL_CAPSULE | Freq: Every day | ORAL | 1 refills | Status: DC

## 2022-04-18 MED ORDER — ATORVASTATIN CALCIUM 40 MG PO TABS: 40.0000 mg | ORAL_TABLET | Freq: Every day | ORAL | 1 refills | Status: DC

## 2022-04-18 MED ORDER — NOVOLIN 70/30 FLEXPEN (70-30) 100 UNIT/ML ~~LOC~~ SUPN: 10.0000 [IU] | PEN_INJECTOR | Freq: Two times a day (BID) | SUBCUTANEOUS | 1 refills | Status: DC

## 2022-04-18 NOTE — Progress Notes (Addendum)
Provider: Richarda Blade FNP-C  Hara Milholland, Donalee Citrin, NP  Patient Care Team: Camile Esters, Donalee Citrin, NP as PCP - General (Family Medicine)  Extended Emergency Contact Information Primary Emergency Contact: Gaither,Gladys Address: [address]          [place], [place] [address] D[place] of [place] Phone: [phone] Relation: Mother Secondary Emergency Contact: Allinson,Forey Mobile Phone: [phone] Relation: Brother  Code Status:  Full Code  Goals of care: Advanced Directive information    11/20/2021    3:14 PM  Advanced Directives  Does Patient Have a Medical Advance Directive? No  Would patient like information on creating a medical advance directive? No - Patient declined     Chief Complaint  Patient presents with   Acute Visit    Anxiety   This service is provided via telemedicine  No vital signs collected/recorded due to the encounter was a telemedicine visit.   Location of patient :  Home   Patient consents to a telephone visit:  yes  Location of the provider office;  [facility] office   Name of any referring provider:  PCP  HPI:  Pt is a 57 y.o. female seen today for an acute visit for evaluation of anxiety.states caring for her mother who is declining in condition has worsen her anxiety.currently on effexor 150 mg daily and Tizinidine.States Tizinidine helps with her anxiety at night.Her Mother keeps awake most of the night calling for assistance.Has a sister who comes every now and then to help her but she is the main care giver. States her mother does not allow CNA's to help her.  Has a counselor that she follows up for depression.  Also request refills on her medication.   Past Medical History:  Diagnosis Date   Allergic rhinitis, cause unspecified    Anemia, unspecified    Diabetes mellitus without complication (HCC)    Esophageal reflux    Generalized anxiety disorder    Headache(784.0)    Hypertension    Pure hypercholesterolemia    Rash and  other nonspecific skin eruption    Unspecified vitamin D deficiency    Past Surgical History:  Procedure Laterality Date   TENDON REPAIR  1990   left index finger    Allergies  Allergen Reactions   Codeine Phosphate     REACTION: vomiting    Outpatient Encounter Medications as of 04/18/2022  Medication Sig   busPIRone (BUSPAR) 5 MG tablet Take 1 tablet (5 mg total) by mouth 2 (two) times daily.   estradiol (CLIMARA - DOSED IN MG/24 HR) 0.025 mg/24hr patch PLACE 1 PATCH ONTO THE SKIN ONCE A WEEK. NEEDS TO SCHEDULE MAMMOGRAM FOR ADDITIONAL REFILLS   fluticasone (FLONASE) 50 MCG/ACT nasal spray Place 2 sprays into both nostrils daily.   ondansetron (ZOFRAN) 4 MG tablet Take 1 tablet (4 mg total) by mouth every 6 (six) hours.   Semaglutide,0.25 or 0.5MG /DOS, (OZEMPIC, 0.25 OR 0.5 MG/DOSE,) 2 MG/3ML SOPN Inject 0.5 mg into the skin once a week.   tiZANidine (ZANAFLEX) 4 MG tablet Take 0.5-1 tablets (2-4 mg total) by mouth every 8 (eight) hours as needed for muscle spasms.   [DISCONTINUED] amLODipine (NORVASC) 5 MG tablet Take 1 tablet (5 mg total) by mouth daily.   [DISCONTINUED] atorvastatin (LIPITOR) 40 MG tablet Take 1 tablet (40 mg total) by mouth daily at 6 PM.   [DISCONTINUED] ibuprofen (ADVIL) 800 MG tablet Take 1 tablet (800 mg total) by mouth every 8 (eight) hours as needed.   [DISCONTINUED]  insulin isophane & regular human KwikPen (NOVOLIN 70/30 KWIKPEN) (70-30) 100 UNIT/ML KwikPen INJECT 10 UNITS INTO THE SKIN 2 (TWO) TIMES DAILY WITH A MEAL.   [DISCONTINUED] lisinopril (ZESTRIL) 5 MG tablet Take 1 tablet (5 mg total) by mouth daily.   [DISCONTINUED] pantoprazole (PROTONIX) 20 MG tablet Take 1 tablet (20 mg total) by mouth daily.   [DISCONTINUED] venlafaxine XR (EFFEXOR-XR) 150 MG 24 hr capsule Take 1 capsule (150 mg total) by mouth daily with breakfast.   amLODipine (NORVASC) 5 MG tablet Take 1 tablet (5 mg total) by mouth daily.   atorvastatin (LIPITOR) 40 MG tablet Take 1 tablet  (40 mg total) by mouth daily at 6 PM.   blood glucose meter kit and supplies KIT Dispense based on patient and insurance preference.  Use before breakfast and at bedtime. E11.4   dapagliflozin propanediol (FARXIGA) 5 MG TABS tablet Take 1 tablet (5 mg total) by mouth daily before breakfast. (Patient not taking: Reported on 04/18/2022)   insulin isophane & regular human KwikPen (NOVOLIN 70/30 KWIKPEN) (70-30) 100 UNIT/ML KwikPen Inject 10 Units into the skin 2 (two) times daily with a meal.   Insulin Pen Needle (BD PEN NEEDLE NANO 2ND GEN) 32G X 4 MM MISC USE WITH INSULIN PEN AS DIRECTED   lisinopril (ZESTRIL) 5 MG tablet Take 1 tablet (5 mg total) by mouth daily.   ONETOUCH VERIO test strip USE BEFORE BREAKFAST AND AT BEDTIME.   venlafaxine XR (EFFEXOR-XR) 150 MG 24 hr capsule Take 1 capsule (150 mg total) by mouth daily with breakfast.   [DISCONTINUED] omeprazole (PRILOSEC OTC) 20 MG tablet Take 1 tablet (20 mg total) by mouth daily.   No facility-administered encounter medications on file as of 04/18/2022.    Review of Systems  Constitutional:  Negative for appetite change, chills, fatigue, fever and unexpected weight change.  Respiratory:  Negative for cough, chest tightness, shortness of breath and wheezing.   Cardiovascular:  Negative for chest pain, palpitations and leg swelling.  Neurological:  Negative for dizziness, weakness, light-headedness and headaches.  Psychiatric/Behavioral:  Negative for agitation, behavioral problems, confusion, hallucinations, self-injury, sleep disturbance and suicidal ideas. The patient is nervous/anxious.        Depression stable     Immunization History  Administered Date(s) Administered   Fluad Quad(high Dose 65+) 11/22/2020, 11/20/2021   Influenza Split 02/15/2011   Influenza Whole 11/09/2007, 12/01/2008   Influenza,inj,Quad PF,6+ Mos 10/21/2013, 03/22/2015, 10/17/2015, 03/12/2019   PFIZER(Purple Top)SARS-COV-2 Vaccination 06/03/2019, 09/07/2019    PNEUMOCOCCAL CONJUGATE-20 01/15/2021   Pneumococcal Polysaccharide-23 12/30/2016   Tdap 03/22/2015, 09/13/2017   Pertinent  Health Maintenance Due  Topic Date Due   OPHTHALMOLOGY EXAM  Never done   Colonoscopy  04/02/2010   MAMMOGRAM  Never done   FOOT EXAM  11/22/2021   HEMOGLOBIN A1C  05/22/2022   INFLUENZA VACCINE  09/12/2022   PAP SMEAR-Modifier  12/26/2024      01/03/2021    5:05 PM 01/15/2021    9:33 AM 01/17/2021    4:06 PM 11/20/2021    3:14 PM 12/26/2021   11:10 AM  Fall Risk  Falls in the past year?  0  0 0  Was there an injury with Fall?  0  0 0  Fall Risk Category Calculator  0  0 0  Fall Risk Category (Retired)  Low  Low Low  (RETIRED) Patient Fall Risk Level Low fall risk  Low fall risk Low fall risk Low fall risk  Patient at Risk for Falls Due  to    No Fall Risks No Fall Risks  Fall risk Follow up    Falls evaluation completed Falls evaluation completed   Functional Status Survey:    Vitals:   04/18/22 1412  BP: (!) 162/86  Pulse: (!) 116  Weight: 131 lb (59.4 kg)  Height: 5\' 2"  (1.575 m)   Body mass index is 23.96 kg/m. Physical Exam Constitutional:      General: She is not in acute distress.    Appearance: She is not ill-appearing.  Pulmonary:     Effort: Pulmonary effort is normal. No respiratory distress.  Neurological:     Mental Status: She is alert.  Psychiatric:        Mood and Affect: Mood is anxious. Mood is not depressed.        Speech: Speech normal.        Behavior: Behavior normal.        Thought Content: Thought content normal.        Judgment: Judgment normal.     Labs reviewed: Recent Labs    11/20/21 1611  NA 137  K 4.8  CL 99  CO2 28  GLUCOSE 233*  BUN 11  CREATININE 0.63  CALCIUM 10.4   Recent Labs    11/20/21 1611  AST 10  ALT 13  BILITOT 0.5  PROT 7.6   Recent Labs    11/20/21 1611  WBC 6.6  NEUTROABS 3,485  HGB 12.5  HCT 39.9  MCV 82.6  PLT 444*   Lab Results  Component Value Date   TSH  0.98 11/20/2021   Lab Results  Component Value Date   HGBA1C 8.7 (H) 11/20/2021   Lab Results  Component Value Date   CHOL 165 11/20/2021   HDL 50 11/20/2021   LDLCALC 98 11/20/2021   LDLDIRECT 99.0 11/22/2020   TRIG 82 11/20/2021   CHOLHDL 3.3 11/20/2021    Significant Diagnostic Results in last 30 days:  No results found.  Assessment/Plan 1. ANXIETY DISORDER, GENERALIZED Anxiety has worsen due to her mother's decline in health condition. - encouraged to continue with counseling service with her Therapist  - start on Buspar side effects discussed.  - busPIRone (BUSPAR) 5 MG tablet; Take 1 tablet (5 mg total) by mouth 2 (two) times daily.  Dispense: 60 tablet; Refill: 3 - venlafaxine XR (EFFEXOR-XR) 150 MG 24 hr capsule; Take 1 capsule (150 mg total) by mouth daily with breakfast.  Dispense: 90 capsule; Refill: 1  2. Moderate episode of recurrent major depressive disorder (HCC) Anxiety has worsen as above due to care giver burden and worsening mother's condition.  - busPIRone (BUSPAR) 5 MG tablet; Take 1 tablet (5 mg total) by mouth 2 (two) times daily.  Dispense: 60 tablet; Refill: 3 - venlafaxine XR (EFFEXOR-XR) 150 MG 24 hr capsule; Take 1 capsule (150 mg total) by mouth daily with breakfast.  Dispense: 90 capsule; Refill: 1  3. Essential hypertension, benign B/p high during visit due to anxiety will refill medication  - amLODipine (NORVASC) 5 MG tablet; Take 1 tablet (5 mg total) by mouth daily.  Dispense: 90 tablet; Refill: 1 - lisinopril (ZESTRIL) 5 MG tablet; Take 1 tablet (5 mg total) by mouth daily.  Dispense: 90 tablet; Refill: 1  4. HYPERCHOLESTEROLEMIA Continue on Atorvastatin  - atorvastatin (LIPITOR) 40 MG tablet; Take 1 tablet (40 mg total) by mouth daily at 6 PM.  Dispense: 90 tablet; Refill: 1  5. Type 2 diabetes mellitus with hyperglycemia, with long-term current  use of insulin (HCC) Request refillson medication  - atorvastatin (LIPITOR) 40 MG tablet; Take  1 tablet (40 mg total) by mouth daily at 6 PM.  Dispense: 90 tablet; Refill: 1 - insulin isophane & regular human KwikPen (NOVOLIN 70/30 KWIKPEN) (70-30) 100 UNIT/ML KwikPen; Inject 10 Units into the skin 2 (two) times daily with a meal.  Dispense: 15 mL; Refill: 1 - lisinopril (ZESTRIL) 5 MG tablet; Take 1 tablet (5 mg total) by mouth daily.  Dispense: 90 tablet; Refill: 1  Family/ staff Communication: Reviewed plan of care with patient verbalized understanding.  Labs/tests ordered: None   Next Appointment: Return if symptoms worsen or fail to improve. I connected with  Bennie Hind on 07/23/22 by a video enabled telemedicine application and verified that I am speaking with the correct person using two identifiers.   I discussed the limitations of evaluation and management by telemedicine. The patient expressed understanding and agreed to proceed.  Spent 12 minutes of face to face with patient  >50% time spent counseling; reviewing medical record; labs; and developing future plan of care.   Caesar Bookman, NP

## 2022-04-22 ENCOUNTER — Ambulatory Visit: Payer: BC Managed Care – PPO | Admitting: Family

## 2022-04-26 NOTE — Telephone Encounter (Signed)
Will have our staff  send letter indicates dates of visit and depression diagnosis as requested.

## 2022-04-26 NOTE — Telephone Encounter (Signed)
Form completed.please fax as requested.

## 2022-04-29 NOTE — Telephone Encounter (Signed)
Form has been faxed by staff.

## 2022-04-30 ENCOUNTER — Encounter: Payer: BC Managed Care – PPO | Admitting: Family

## 2022-05-02 ENCOUNTER — Other Ambulatory Visit: Payer: Self-pay | Admitting: Family

## 2022-05-02 DIAGNOSIS — F331 Major depressive disorder, recurrent, moderate: Secondary | ICD-10-CM

## 2022-05-02 DIAGNOSIS — F411 Generalized anxiety disorder: Secondary | ICD-10-CM

## 2022-05-20 NOTE — Progress Notes (Signed)
  This encounter was created in error - please disregard. No show 

## 2022-05-24 ENCOUNTER — Encounter: Payer: BC Managed Care – PPO | Admitting: Adult Health

## 2022-05-27 ENCOUNTER — Other Ambulatory Visit: Payer: Self-pay | Admitting: Family

## 2022-05-27 NOTE — Telephone Encounter (Signed)
Will have staff resend letter to return to work on Wednesday,05/29/2022.

## 2022-05-28 NOTE — Progress Notes (Signed)
This encounter was created in error - please disregard.

## 2022-06-06 ENCOUNTER — Encounter: Payer: Self-pay | Admitting: Family

## 2022-06-06 NOTE — Telephone Encounter (Signed)
Resend letter to return to work 06/10/2022 as requested.

## 2022-06-15 ENCOUNTER — Other Ambulatory Visit: Payer: Self-pay | Admitting: Family

## 2022-06-15 DIAGNOSIS — K219 Gastro-esophageal reflux disease without esophagitis: Secondary | ICD-10-CM

## 2022-07-02 ENCOUNTER — Other Ambulatory Visit: Payer: Self-pay

## 2022-07-02 DIAGNOSIS — M5432 Sciatica, left side: Secondary | ICD-10-CM

## 2022-07-02 MED ORDER — IBUPROFEN 800 MG PO TABS: 800.0000 mg | ORAL_TABLET | Freq: Three times a day (TID) | ORAL | 0 refills | Status: DC | PRN

## 2022-07-11 ENCOUNTER — Encounter: Payer: BC Managed Care – PPO | Admitting: Family

## 2022-07-19 ENCOUNTER — Encounter: Payer: BC Managed Care – PPO | Admitting: Family

## 2022-07-19 NOTE — Progress Notes (Signed)
  This encounter was created in error - please disregard. No show 

## 2022-07-21 NOTE — Progress Notes (Signed)
  This encounter was created in error - please disregard. No show 

## 2022-07-23 NOTE — Telephone Encounter (Signed)
Form was printed and placed in Ngetich, Dinah C, NP review and sign folder. Once complete form will be further processed (faxed) by the administrative staff

## 2022-07-25 ENCOUNTER — Encounter: Payer: Self-pay | Admitting: Family

## 2022-07-25 NOTE — Progress Notes (Signed)
Letter to return to work on 07/29/2022 written as requested by patient.

## 2022-09-16 ENCOUNTER — Other Ambulatory Visit: Payer: Self-pay | Admitting: Family

## 2022-09-16 ENCOUNTER — Telehealth (INDEPENDENT_AMBULATORY_CARE_PROVIDER_SITE_OTHER): Payer: BC Managed Care – PPO | Admitting: Family

## 2022-09-16 ENCOUNTER — Encounter: Payer: Self-pay | Admitting: Family

## 2022-09-16 DIAGNOSIS — R051 Acute cough: Secondary | ICD-10-CM

## 2022-09-16 DIAGNOSIS — J029 Acute pharyngitis, unspecified: Secondary | ICD-10-CM | POA: Diagnosis not present

## 2022-09-16 DIAGNOSIS — F411 Generalized anxiety disorder: Secondary | ICD-10-CM

## 2022-09-16 DIAGNOSIS — F331 Major depressive disorder, recurrent, moderate: Secondary | ICD-10-CM

## 2022-09-16 DIAGNOSIS — E7800 Pure hypercholesterolemia, unspecified: Secondary | ICD-10-CM

## 2022-09-16 DIAGNOSIS — R52 Pain, unspecified: Secondary | ICD-10-CM

## 2022-09-16 DIAGNOSIS — E1165 Type 2 diabetes mellitus with hyperglycemia: Secondary | ICD-10-CM

## 2022-09-16 DIAGNOSIS — I10 Essential (primary) hypertension: Secondary | ICD-10-CM

## 2022-09-16 MED ORDER — AZITHROMYCIN 250 MG PO TABS
ORAL_TABLET | ORAL | 0 refills | Status: AC
Start: 2022-09-16 — End: 2022-09-21

## 2022-09-16 NOTE — Progress Notes (Unsigned)
Provider: Richarda Blade FNP-C  Anda Sobotta, Donalee Citrin, NP  Patient Care Team: Shaila Gilchrest, Donalee Citrin, NP as PCP - General (Family Medicine)  Extended Emergency Contact Information Primary Emergency Contact: Gaither,Gladys Address: [address]          [place], [place] [address] Darden [place] of [place] Phone: [phone] Relation: Mother Secondary Emergency Contact: Raybon,Forey Mobile Phone: [phone] Relation: Brother  Code Status:  Full Code  Goals of care: Advanced Directive information    11/20/2021    3:14 PM  Advanced Directives  Does Patient Have a Medical Advance Directive? No  Would patient like information on creating a medical advance directive? No - Patient declined     Chief Complaint  Patient presents with   Acute Visit    Complains of Cough and Nasal Dainage.     HPI:  Pt is a 57 y.o. female seen today for an acute visit for evaluation of cough  since yesterday.states started with scratchy throat on Friday.cough described as non-productive.Has felt like chest is wheezing.Also has runny nose,congestion,generalized body aches and fatigue.she denies any fever,chills,nausea,vomiting or diarrhea.Appetite has been good but seems to be changing.Has been drinking Ginger honey teas.Has not tested for COVID-19. States Co-worker have been sick.Not sure if it was COVID-19 infection.    Past Medical History:  Diagnosis Date   Allergic rhinitis, cause unspecified    Anemia, unspecified    Diabetes mellitus without complication (HCC)    Esophageal reflux    Generalized anxiety disorder    Headache(784.0)    Hypertension    Pure hypercholesterolemia    Rash and other nonspecific skin eruption    Unspecified vitamin D deficiency    Past Surgical History:  Procedure Laterality Date   TENDON REPAIR  1990   left index finger    Allergies  Allergen Reactions   Codeine Phosphate     REACTION: vomiting    Outpatient Encounter Medications as of 09/16/2022   Medication Sig   amLODipine (NORVASC) 5 MG tablet Take 1 tablet (5 mg total) by mouth daily. PATIENT NEEDS TO MAKE AND KEEP IN OFFICE APPOINTMENT FOR ANY FURTHER REFILLS   atorvastatin (LIPITOR) 40 MG tablet Take 1 tablet (40 mg total) by mouth daily. PATIENT NEEDS TO MAKE AND KEEP IN OFFICE APPOINTMENT FOR ANY FURTHER REFILLS   blood glucose meter kit and supplies KIT Dispense based on patient and insurance preference.  Use before breakfast and at bedtime. E11.4   estradiol (CLIMARA - DOSED IN MG/24 HR) 0.025 mg/24hr patch PLACE 1 PATCH ONTO THE SKIN ONCE A WEEK. NEEDS TO SCHEDULE MAMMOGRAM FOR ADDITIONAL REFILLS   fluticasone (FLONASE) 50 MCG/ACT nasal spray Place 2 sprays into both nostrils daily.   ibuprofen (ADVIL) 800 MG tablet Take 1 tablet (800 mg total) by mouth every 8 (eight) hours as needed.   insulin isophane & regular human KwikPen (NOVOLIN 70/30 KWIKPEN) (70-30) 100 UNIT/ML KwikPen Inject 10 Units into the skin 2 (two) times daily with a meal.   Insulin Pen Needle (BD PEN NEEDLE NANO 2ND GEN) 32G X 4 MM MISC USE WITH INSULIN PEN AS DIRECTED   ondansetron (ZOFRAN) 4 MG tablet Take 1 tablet (4 mg total) by mouth every 6 (six) hours.   ONETOUCH VERIO test strip USE BEFORE BREAKFAST AND AT BEDTIME.   pantoprazole (PROTONIX) 20 MG tablet TAKE 1 TABLET BY MOUTH EVERY DAY   Semaglutide,0.25 or 0.5MG /DOS, (OZEMPIC, 0.25 OR 0.5 MG/DOSE,) 2 MG/3ML SOPN Inject 0.5 mg into the skin once a  week.   tiZANidine (ZANAFLEX) 4 MG tablet Take 0.5-1 tablets (2-4 mg total) by mouth every 8 (eight) hours as needed for muscle spasms.   venlafaxine XR (EFFEXOR-XR) 150 MG 24 hr capsule Take 1 capsule (150 mg total) by mouth daily with breakfast. PATIENT NEEDS TO MAKE AND KEEP IN OFFICE APPOINTMENT FOR ANY FURTHER REFILLS   lisinopril (ZESTRIL) 5 MG tablet Take 1 tablet (5 mg total) by mouth daily. PATIENT NEEDS TO MAKE AND KEEP IN OFFICE APPOINTMENT FOR ANY FURTHER REFILLS   [DISCONTINUED] busPIRone  (BUSPAR) 5 MG tablet Take 1 tablet (5 mg total) by mouth 2 (two) times daily.   [DISCONTINUED] dapagliflozin propanediol (FARXIGA) 5 MG TABS tablet Take 1 tablet (5 mg total) by mouth daily before breakfast. (Patient not taking: Reported on 04/18/2022)   [DISCONTINUED] omeprazole (PRILOSEC OTC) 20 MG tablet Take 1 tablet (20 mg total) by mouth daily.   No facility-administered encounter medications on file as of 09/16/2022.    Review of Systems  Constitutional:  Positive for appetite change and fatigue. Negative for chills, fever and unexpected weight change.  HENT:  Positive for congestion, rhinorrhea and sore throat. Negative for dental problem, ear discharge, ear pain, facial swelling, hearing loss, nosebleeds, postnasal drip, sinus pressure, sinus pain, sneezing, tinnitus and trouble swallowing.   Eyes:  Negative for pain, discharge, redness, itching and visual disturbance.  Respiratory:  Positive for cough. Negative for chest tightness, shortness of breath and wheezing.   Cardiovascular:  Negative for chest pain, palpitations and leg swelling.  Gastrointestinal:  Negative for abdominal distention, abdominal pain, blood in stool, constipation, diarrhea, nausea and vomiting.  Skin:  Negative for color change, pallor and rash.  Neurological:  Negative for dizziness, weakness, light-headedness and headaches.    Immunization History  Administered Date(s) Administered   Fluad Quad(high Dose 65+) 11/22/2020, 11/20/2021   Influenza Split 02/15/2011   Influenza Whole 11/09/2007, 12/01/2008   Influenza,inj,Quad PF,6+ Mos 10/21/2013, 03/22/2015, 10/17/2015, 03/12/2019   PFIZER(Purple Top)SARS-COV-2 Vaccination 06/03/2019, 09/07/2019   PNEUMOCOCCAL CONJUGATE-20 01/15/2021   Pneumococcal Polysaccharide-23 12/30/2016   Tdap 03/22/2015, 09/13/2017   Pertinent  Health Maintenance Due  Topic Date Due   OPHTHALMOLOGY EXAM  Never done   Colonoscopy  04/02/2010   MAMMOGRAM  Never done   FOOT EXAM   11/22/2021   HEMOGLOBIN A1C  05/22/2022   INFLUENZA VACCINE  09/12/2022   PAP SMEAR-Modifier  12/26/2024      01/15/2021    9:33 AM 01/17/2021    4:06 PM 11/20/2021    3:14 PM 12/26/2021   11:10 AM 09/16/2022    2:42 PM  Fall Risk  Falls in the past year? 0  0 0 0  Was there an injury with Fall? 0  0 0 0  Fall Risk Category Calculator 0  0 0 0  Fall Risk Category (Retired) Low  Low Low   (RETIRED) Patient Fall Risk Level  Low fall risk Low fall risk Low fall risk   Patient at Risk for Falls Due to   No Fall Risks No Fall Risks No Fall Risks  Fall risk Follow up   Falls evaluation completed Falls evaluation completed Falls evaluation completed   Functional Status Survey:    There were no vitals filed for this visit. There is no height or weight on file to calculate BMI. Physical Exam Constitutional:      General: She is not in acute distress.    Appearance: She is not ill-appearing.  HENT:  Nose: Congestion and rhinorrhea present.  Pulmonary:     Effort: Pulmonary effort is normal. No respiratory distress.  Neurological:     Mental Status: She is alert and oriented to person, place, and time.  Psychiatric:        Mood and Affect: Mood normal.        Behavior: Behavior normal.     Labs reviewed: Recent Labs    11/20/21 1611  NA 137  K 4.8  CL 99  CO2 28  GLUCOSE 233*  BUN 11  CREATININE 0.63  CALCIUM 10.4   Recent Labs    11/20/21 1611  AST 10  ALT 13  BILITOT 0.5  PROT 7.6   Recent Labs    11/20/21 1611  WBC 6.6  NEUTROABS 3,485  HGB 12.5  HCT 39.9  MCV 82.6  PLT 444*   Lab Results  Component Value Date   TSH 0.98 11/20/2021   Lab Results  Component Value Date   HGBA1C 8.7 (H) 11/20/2021   Lab Results  Component Value Date   CHOL 165 11/20/2021   HDL 50 11/20/2021   LDLCALC 98 11/20/2021   LDLDIRECT 99.0 11/22/2020   TRIG 82 11/20/2021   CHOLHDL 3.3 11/20/2021    Significant Diagnostic Results in last 30 days:  No results  found.  Assessment/Plan 1. Acute cough Worsening cough.Feels like some wheezing in the chest.No shortness of breath.CO-workers have been sick . - advised to do a home test for COVID-19  - start on Zpak as below.Has taken in the past  - azithromycin (ZITHROMAX) 250 MG tablet; Take 2 tablets on day 1, then 1 tablet daily on days 2 through 5  Dispense: 6 tablet; Refill: 0  2. Generalized body aches Encourage to take Tylenol as needed  - increase fluid intake/soups - azithromycin (ZITHROMAX) 250 MG tablet; Take 2 tablets on day 1, then 1 tablet daily on days 2 through 5  Dispense: 6 tablet; Refill: 0  3. Sore throat Scratchy throat since Friday  - COVID-19 test advised to r/o COVID infection  Will send paxlovid if COVID-19 test is positive  - azithromycin (ZITHROMAX) 250 MG tablet; Take 2 tablets on day 1, then 1 tablet daily on days 2 through 5  Dispense: 6 tablet; Refill: 0  Family/ staff Communication: Reviewed plan of care with patient verbalized understanding.   Labs/tests ordered: None   Next Appointment: Return if symptoms worsen or fail to improve.  I connected with  Bennie Hind on 09/16/22 by a video enabled telemedicine application and verified that I am speaking with the correct person using two identifiers.   I discussed the limitations of evaluation and management by telemedicine. The patient expressed understanding and agreed to proceed.   Spent 12 minutes of face to face with patient  >50% time spent counseling; reviewing medical record; labs; and developing future plan of care.  Caesar Bookman, NP

## 2022-09-17 NOTE — Progress Notes (Signed)
I connected with  Bennie Hind on 09/16/22 by a video enabled telemedicine application and verified that I am speaking with the correct person using two identifiers.   I discussed the limitations of evaluation and management by telemedicine. The patient expressed understanding and agreed to proceed.

## 2022-09-17 NOTE — Progress Notes (Signed)
This service is provided via telemedicine  No vital signs collected/recorded due to the encounter was a telemedicine visit.   Location of patient (ex: home, work):  Home   Patient consents to a telephone visit:  Yes, 09/16/22   Location of the provider (ex: office, home):  [facility]  Name of any referring provider:  Arnika Larzelere, Donalee Citrin, NP   Names of all persons participating in the telemedicine service and their role in the encounter: Bethany B/CMA,Jamail Cullers C, NP and patient  Time spent on call:  11 minutes

## 2022-09-20 ENCOUNTER — Other Ambulatory Visit: Payer: Self-pay | Admitting: Family

## 2022-09-20 DIAGNOSIS — E1165 Type 2 diabetes mellitus with hyperglycemia: Secondary | ICD-10-CM

## 2022-10-01 ENCOUNTER — Encounter: Payer: Self-pay | Admitting: Family

## 2022-11-06 ENCOUNTER — Telehealth: Payer: Self-pay

## 2022-11-06 NOTE — Telephone Encounter (Signed)
Patient called to check the statu of [employer] paperwork being completed. Patient is aware paperwork received and in Ngetich, Dinah C, NP to be done folder on her desk.

## 2022-11-11 NOTE — Telephone Encounter (Signed)
Patient notified paperwork was completed.  Requested it to be faxed to [facility] with Leave Management Services Fax: [phone]  Faxed as requested.  Copy sent to scanning.

## 2022-11-11 NOTE — Telephone Encounter (Signed)
FMLA paper work completed given to [place] to fax or patient to pickup.

## 2022-11-14 ENCOUNTER — Telehealth: Payer: Self-pay

## 2022-11-14 ENCOUNTER — Other Ambulatory Visit: Payer: Self-pay | Admitting: Family

## 2022-11-14 DIAGNOSIS — E7800 Pure hypercholesterolemia, unspecified: Secondary | ICD-10-CM

## 2022-11-14 DIAGNOSIS — E1165 Type 2 diabetes mellitus with hyperglycemia: Secondary | ICD-10-CM

## 2022-11-14 DIAGNOSIS — I10 Essential (primary) hypertension: Secondary | ICD-10-CM

## 2022-11-14 NOTE — Telephone Encounter (Signed)
Incoming mail received from [employer], FMLA  Form indicated to complete and fax by 11/04/2022, however it was received in the mail on 11/13/2022  Side note- mail was dated 11/01/2022  According to documentation from medical assistant, Synetta Fail May, paperwork was received by other means and already processed on 11/11/2022

## 2022-11-15 ENCOUNTER — Other Ambulatory Visit: Payer: Self-pay | Admitting: Family

## 2022-11-15 DIAGNOSIS — F411 Generalized anxiety disorder: Secondary | ICD-10-CM

## 2022-11-15 DIAGNOSIS — F331 Major depressive disorder, recurrent, moderate: Secondary | ICD-10-CM

## 2022-11-15 DIAGNOSIS — I10 Essential (primary) hypertension: Secondary | ICD-10-CM

## 2022-11-18 ENCOUNTER — Encounter: Payer: BC Managed Care – PPO | Admitting: Family

## 2022-11-20 ENCOUNTER — Ambulatory Visit: Payer: BC Managed Care – PPO | Admitting: Family

## 2022-11-25 ENCOUNTER — Ambulatory Visit: Payer: BC Managed Care – PPO | Admitting: Family Medicine

## 2022-11-25 NOTE — Progress Notes (Deleted)
    Brenda [name] is a 57 y.o. female who presents to [facility] at [facility] today for arm and knee pain. Pt was previously seen by Dr. Denyse Amass on 09/28/21 for neck and thoracic back pain.  Today, pt c/o knee pain x ***  Knee swelling: Mechanical symptoms: Radiates: Aggravates: Treatments tried:  She also c/o arm pain x ***. Pt locates pain to ***  Radiates: Aggravates: Treatments tried:  Pertinent review of systems: ***  Relevant historical information: ***   Exam:  LMP 11/26/2013  General: Well Developed, well nourished, and in no acute distress.   MSK: ***    Lab and Radiology Results No results found for this or any previous visit (from the past 72 hour(s)). No results found.     Assessment and Plan: 57 y.o. female with ***   PDMP not reviewed this encounter. No orders of the defined types were placed in this encounter.  No orders of the defined types were placed in this encounter.    Discussed warning signs or symptoms. Please see discharge instructions. Patient expresses understanding.   ***

## 2022-11-27 NOTE — Progress Notes (Signed)
  This encounter was created in error - please disregard. No show 

## 2022-12-25 ENCOUNTER — Ambulatory Visit: Payer: BC Managed Care – PPO | Admitting: Family Medicine

## 2023-02-02 ENCOUNTER — Other Ambulatory Visit: Payer: Self-pay | Admitting: Family

## 2023-02-02 DIAGNOSIS — I10 Essential (primary) hypertension: Secondary | ICD-10-CM

## 2023-03-06 ENCOUNTER — Ambulatory Visit: Payer: BC Managed Care – PPO | Admitting: Family

## 2023-03-11 ENCOUNTER — Ambulatory Visit: Payer: BC Managed Care – PPO | Admitting: Family

## 2023-03-11 ENCOUNTER — Encounter: Payer: Self-pay | Admitting: Family

## 2023-03-11 VITALS — BP 138/74 | HR 92 | Temp 97.7°F | Resp 19 | Ht 62.0 in | Wt 138.8 lb

## 2023-03-11 DIAGNOSIS — I10 Essential (primary) hypertension: Secondary | ICD-10-CM

## 2023-03-11 DIAGNOSIS — K2100 Gastro-esophageal reflux disease with esophagitis, without bleeding: Secondary | ICD-10-CM | POA: Diagnosis not present

## 2023-03-11 DIAGNOSIS — F411 Generalized anxiety disorder: Secondary | ICD-10-CM

## 2023-03-11 DIAGNOSIS — Z794 Long term (current) use of insulin: Secondary | ICD-10-CM

## 2023-03-11 DIAGNOSIS — Z1211 Encounter for screening for malignant neoplasm of colon: Secondary | ICD-10-CM

## 2023-03-11 DIAGNOSIS — E1165 Type 2 diabetes mellitus with hyperglycemia: Secondary | ICD-10-CM | POA: Diagnosis not present

## 2023-03-11 DIAGNOSIS — Z1231 Encounter for screening mammogram for malignant neoplasm of breast: Secondary | ICD-10-CM

## 2023-03-11 DIAGNOSIS — E7800 Pure hypercholesterolemia, unspecified: Secondary | ICD-10-CM

## 2023-03-11 DIAGNOSIS — E785 Hyperlipidemia, unspecified: Secondary | ICD-10-CM | POA: Diagnosis not present

## 2023-03-11 DIAGNOSIS — F331 Major depressive disorder, recurrent, moderate: Secondary | ICD-10-CM

## 2023-03-11 DIAGNOSIS — Z23 Encounter for immunization: Secondary | ICD-10-CM

## 2023-03-11 DIAGNOSIS — K219 Gastro-esophageal reflux disease without esophagitis: Secondary | ICD-10-CM

## 2023-03-11 MED ORDER — NOVOLIN 70/30 FLEXPEN (70-30) 100 UNIT/ML ~~LOC~~ SUPN: 10.0000 [IU] | PEN_INJECTOR | Freq: Two times a day (BID) | SUBCUTANEOUS | 1 refills | Status: DC

## 2023-03-11 MED ORDER — ATORVASTATIN CALCIUM 40 MG PO TABS: 40.0000 mg | ORAL_TABLET | Freq: Every day | ORAL | 1 refills | Status: DC

## 2023-03-11 MED ORDER — LISINOPRIL 5 MG PO TABS: 5.0000 mg | ORAL_TABLET | Freq: Every day | ORAL | 1 refills | Status: DC

## 2023-03-11 MED ORDER — TIZANIDINE HCL 4 MG PO TABS: 2.0000 mg | ORAL_TABLET | Freq: Three times a day (TID) | ORAL | 1 refills | Status: AC | PRN

## 2023-03-11 MED ORDER — ONDANSETRON HCL 4 MG PO TABS: 4.0000 mg | ORAL_TABLET | Freq: Four times a day (QID) | ORAL | 0 refills | Status: AC

## 2023-03-11 MED ORDER — PANTOPRAZOLE SODIUM 20 MG PO TBEC: 20.0000 mg | DELAYED_RELEASE_TABLET | Freq: Every day | ORAL | 1 refills | Status: DC

## 2023-03-11 MED ORDER — OZEMPIC (0.25 OR 0.5 MG/DOSE) 2 MG/3ML ~~LOC~~ SOPN: 0.5000 mg | PEN_INJECTOR | SUBCUTANEOUS | 3 refills | Status: DC

## 2023-03-11 MED ORDER — AMLODIPINE BESYLATE 5 MG PO TABS: 5.0000 mg | ORAL_TABLET | Freq: Every day | ORAL | 1 refills | Status: DC

## 2023-03-11 NOTE — Progress Notes (Signed)
Provider: Richarda Blade FNP-C   Jonmarc Bodkin, Donalee Citrin, NP  Patient Care Team: Veronica Guerrant, Donalee Citrin, NP as PCP - General (Family Medicine)  Extended Emergency Contact Information Primary Emergency Contact: Gaither,Gladys Address: [address]          [place], [place] [address] [place] Phone: [phone] Relation: Mother Secondary Emergency Contact: Traynham,Forey Mobile Phone: [phone] Relation: Brother  Code Status:  Full Code  Goals of care: Advanced Directive information    03/11/2023    3:40 PM  Advanced Directives  Does Patient Have a Medical Advance Directive? No  Would patient like information on creating a medical advance directive? No - Patient declined     Chief Complaint  Patient presents with   Medical Management of Chronic Issues    Blood pressure and blood sugar.   Discussed the use of AI scribe software for clinical note transcription with the patient, who gave verbal consent to proceed.  HPI:  Pt is a 58 y.o. female seen today with diabetes who presents for a follow-up visit.  She is concerned about her unstable blood sugar levels and has been off Ozempic for some time. She wants to restart it. She is currently taking Novolin 70/30 insulin, 10 units twice daily. No numbness or tingling in her hands or feet, and she manages her own toenail care. No burning sensation during urination, and she drinks a lot of water. No recent nausea.  Her weight has increased from 131 lbs to 138.8 lbs, which she attributes to her work schedule involving 11-hour days, leaving her tired and less active at home. She takes the stairs at work but eats out more often, contributing to weight gain and elevated blood sugar levels. She drinks a lot of water and avoids sweets, but occasionally consumes a small candy bar or soda.  She is currently taking Effexor 150 mg for depression and reports doing well. She also takes tizanidine for muscle relaxation and sleep,  ibuprofen 800 mg for arthritis pain, and Protonix for reflux, which has worsened with weight gain. She requests refills for tizanidine, Protonix, and Zofran, which she uses for nausea but has not needed recently. She also takes lisinopril 5 mg for blood pressure, atorvastatin for cholesterol, and amlodipine 5 mg.  No fever, no recent nausea, no numbness or tingling in extremities, no burning sensation during urination, no breast pain or tenderness, no lumps, and no nipple discharge.       Past Medical History:  Diagnosis Date   Allergic rhinitis, cause unspecified    Anemia, unspecified    Diabetes mellitus without complication (HCC)    Esophageal reflux    Generalized anxiety disorder    Headache(784.0)    Hypertension    Pure hypercholesterolemia    Rash and other nonspecific skin eruption    Unspecified vitamin D deficiency    Past Surgical History:  Procedure Laterality Date   TENDON REPAIR  1990   left index finger    Allergies  Allergen Reactions   Codeine Phosphate     REACTION: vomiting    Allergies as of 03/11/2023       Reactions   Codeine Phosphate    REACTION: vomiting        Medication List        Accurate as of March 11, 2023  4:00 PM. If you have any questions, ask your nurse or doctor.          amLODipine 5 MG tablet Commonly known  as: NORVASC TAKE 1 TABLET DAILY. PATIENT NEEDS TO MAKE AND KEEP IN OFFICE APPOINTMENT FOR ANY FURTHER REFILLS   atorvastatin 40 MG tablet Commonly known as: LIPITOR TAKE 1 TABLET (40 MG TOTAL) BY MOUTH DAILY. PATIENT NEEDS TO MAKE AND KEEP IN OFFICE APPOINTMENT FOR ANY FURTHER REFILLS   BD Pen Needle Nano 2nd Gen 32G X 4 MM Misc Generic drug: Insulin Pen Needle USE WITH INSULIN PEN AS DIRECTED   blood glucose meter kit and supplies Kit Dispense based on patient and insurance preference.  Use before breakfast and at bedtime. E11.4   estradiol 0.025 mg/24hr patch Commonly known as: CLIMARA - Dosed in mg/24  hr PLACE 1 PATCH ONTO THE SKIN ONCE A WEEK. NEEDS TO SCHEDULE MAMMOGRAM FOR ADDITIONAL REFILLS   fluticasone 50 MCG/ACT nasal spray Commonly known as: FLONASE Place 2 sprays into both nostrils daily.   ibuprofen 800 MG tablet Commonly known as: ADVIL Take 1 tablet (800 mg total) by mouth every 8 (eight) hours as needed.   lisinopril 5 MG tablet Commonly known as: ZESTRIL TAKE 1 TABLET DAILY. PATIENT NEEDS TO MAKE AND KEEP IN OFFICE APPOINTMENT FOR ANY FURTHER REFILLS   NovoLIN 70/30 Kwikpen (70-30) 100 UNIT/ML KwikPen Generic drug: insulin isophane & regular human KwikPen Inject 10 Units into the skin 2 (two) times daily with a meal.   ondansetron 4 MG tablet Commonly known as: ZOFRAN Take 1 tablet (4 mg total) by mouth every 6 (six) hours.   OneTouch Verio test strip Generic drug: glucose blood USE BEFORE BREAKFAST AND AT BEDTIME.   Ozempic (0.25 or 0.5 MG/DOSE) 2 MG/3ML Sopn Generic drug: Semaglutide(0.25 or 0.5MG /DOS) Inject 0.5 mg into the skin once a week.   pantoprazole 20 MG tablet Commonly known as: PROTONIX TAKE 1 TABLET BY MOUTH EVERY DAY   tiZANidine 4 MG tablet Commonly known as: ZANAFLEX Take 0.5-1 tablets (2-4 mg total) by mouth every 8 (eight) hours as needed for muscle spasms.   venlafaxine XR 150 MG 24 hr capsule Commonly known as: EFFEXOR-XR TAKE 1 CAPSULE (150 MG TOTAL) BY MOUTH DAILY WITH BREAKFAST. PATIENT NEEDS TO MAKE AND KEEP IN OFFICE APPOINTMENT FOR ANY FURTHER REFILLS        Review of Systems  Constitutional:  Negative for appetite change, chills, fatigue, fever and unexpected weight change.  HENT:  Negative for congestion, dental problem, ear discharge, ear pain, facial swelling, hearing loss, nosebleeds, postnasal drip, rhinorrhea, sinus pressure, sinus pain, sneezing, sore throat, tinnitus and trouble swallowing.   Eyes:  Negative for pain, discharge, redness, itching and visual disturbance.  Respiratory:  Negative for cough, chest  tightness, shortness of breath and wheezing.   Cardiovascular:  Negative for chest pain, palpitations and leg swelling.  Gastrointestinal:  Negative for abdominal distention, abdominal pain, blood in stool, constipation, diarrhea, nausea and vomiting.  Endocrine: Negative for cold intolerance, heat intolerance, polydipsia, polyphagia and polyuria.  Genitourinary:  Negative for difficulty urinating, dysuria, flank pain, frequency and urgency.  Musculoskeletal:  Negative for arthralgias, back pain, gait problem, joint swelling, myalgias, neck pain and neck stiffness.  Skin:  Negative for color change, pallor, rash and wound.  Neurological:  Negative for dizziness, syncope, speech difficulty, weakness, light-headedness, numbness and headaches.  Hematological:  Does not bruise/bleed easily.  Psychiatric/Behavioral:  Negative for agitation, behavioral problems, confusion, hallucinations, self-injury, sleep disturbance and suicidal ideas. The patient is not nervous/anxious.     Immunization History  Administered Date(s) Administered   Fluad Quad(high Dose 65+) 11/22/2020, 11/20/2021  Influenza Split 02/15/2011   Influenza Whole 11/09/2007, 12/01/2008   Influenza,inj,Quad PF,6+ Mos 10/21/2013, 03/22/2015, 10/17/2015, 03/12/2019   PFIZER(Purple Top)SARS-COV-2 Vaccination 06/03/2019, 09/07/2019   PNEUMOCOCCAL CONJUGATE-20 01/15/2021   Pneumococcal Polysaccharide-23 12/30/2016   Tdap 03/22/2015, 09/13/2017   Pertinent  Health Maintenance Due  Topic Date Due   OPHTHALMOLOGY EXAM  Never done   Colonoscopy  04/02/2010   MAMMOGRAM  Never done   FOOT EXAM  11/22/2021   HEMOGLOBIN A1C  05/22/2022   INFLUENZA VACCINE  09/12/2022      01/17/2021    4:06 PM 11/20/2021    3:14 PM 12/26/2021   11:10 AM 09/16/2022    2:42 PM 03/11/2023    3:38 PM  Fall Risk  Falls in the past year?  0 0 0 0  Was there an injury with Fall?  0 0 0 0  Fall Risk Category Calculator  0 0 0 0  Fall Risk Category  (Retired)  Low Low    (RETIRED) Patient Fall Risk Level Low fall risk Low fall risk Low fall risk    Patient at Risk for Falls Due to  No Fall Risks No Fall Risks No Fall Risks   Fall risk Follow up  Falls evaluation completed Falls evaluation completed Falls evaluation completed    Functional Status Survey:    Vitals:   03/11/23 1546  BP: 138/74  Pulse: 92  Resp: 19  Temp: 97.7 F (36.5 C)  SpO2: 98%  Weight: 138 lb 12.8 oz (63 kg)  Height: 5\' 2"  (1.575 m)   Body mass index is 25.39 kg/m. Physical Exam Vitals reviewed.  Constitutional:      General: She is not in acute distress.    Appearance: Normal appearance. She is overweight. She is not ill-appearing or diaphoretic.  HENT:     Head: Normocephalic.     Right Ear: Tympanic membrane, ear canal and external ear normal. There is no impacted cerumen.     Left Ear: Tympanic membrane, ear canal and external ear normal. There is no impacted cerumen.     Nose: Nose normal. No congestion or rhinorrhea.     Mouth/Throat:     Mouth: Mucous membranes are moist.     Pharynx: Oropharynx is clear. No oropharyngeal exudate or posterior oropharyngeal erythema.  Eyes:     General: No scleral icterus.       Right eye: No discharge.        Left eye: No discharge.     Extraocular Movements: Extraocular movements intact.     Conjunctiva/sclera: Conjunctivae normal.     Pupils: Pupils are equal, round, and reactive to light.  Neck:     Vascular: No carotid bruit.  Cardiovascular:     Rate and Rhythm: Normal rate and regular rhythm.     Pulses: Normal pulses.     Heart sounds: Normal heart sounds. No murmur heard.    No friction rub. No gallop.  Pulmonary:     Effort: Pulmonary effort is normal. No respiratory distress.     Breath sounds: Normal breath sounds. No wheezing, rhonchi or rales.  Chest:     Chest wall: No tenderness.  Abdominal:     General: Bowel sounds are normal. There is no distension.     Palpations: Abdomen is  soft. There is no mass.     Tenderness: There is no abdominal tenderness. There is no right CVA tenderness, left CVA tenderness, guarding or rebound.  Musculoskeletal:  General: No swelling or tenderness. Normal range of motion.     Cervical back: Normal range of motion. No rigidity or tenderness.     Right lower leg: No edema.     Left lower leg: No edema.  Lymphadenopathy:     Cervical: No cervical adenopathy.  Skin:    General: Skin is warm and dry.     Coloration: Skin is not pale.     Findings: No bruising, erythema, lesion or rash.  Neurological:     Mental Status: She is alert and oriented to person, place, and time.     Cranial Nerves: No cranial nerve deficit.     Sensory: No sensory deficit.     Motor: No weakness.     Coordination: Coordination normal.     Gait: Gait normal.  Psychiatric:        Mood and Affect: Mood normal.        Speech: Speech normal.        Behavior: Behavior normal.        Thought Content: Thought content normal.        Judgment: Judgment normal.     Labs reviewed: No results for input(s): "NA", "K", "CL", "CO2", "GLUCOSE", "BUN", "CREATININE", "CALCIUM", "MG", "PHOS" in the last 8760 hours. No results for input(s): "AST", "ALT", "ALKPHOS", "BILITOT", "PROT", "ALBUMIN" in the last 8760 hours. No results for input(s): "WBC", "NEUTROABS", "HGB", "HCT", "MCV", "PLT" in the last 8760 hours. Lab Results  Component Value Date   TSH 0.98 11/20/2021   Lab Results  Component Value Date   HGBA1C 8.7 (H) 11/20/2021   Lab Results  Component Value Date   CHOL 165 11/20/2021   HDL 50 11/20/2021   LDLCALC 98 11/20/2021   LDLDIRECT 99.0 11/22/2020   TRIG 82 11/20/2021   CHOLHDL 3.3 11/20/2021    Significant Diagnostic Results in last 30 days:  No results found.  Assessment/Plan  Type 2 Diabetes Mellitus Type 2 Diabetes Mellitus with elevated blood glucose levels. Weight gain likely contributing to poor glycemic control. Currently on  Novolin 70/30 insulin, 10 units twice daily. Discussed restarting Ozempic with gradual dose increase to monitor tolerance and effectiveness. Emphasized dietary modifications and increased physical activity. - Restart Ozempic at 0.5 mg for one month, then titrate to 0.75 mg if well-tolerated - Order HbA1c test - Schedule follow-up in six months - Encourage dietary modifications to reduce fried foods and carbohydrates - Recommend increased physical activity, especially on weekends - Lipid panel; Future - TSH; Future - COMPLETE METABOLIC PANEL WITH GFR; Future - CBC with Differential/Platelet; Future - Hemoglobin A1c; Future - atorvastatin (LIPITOR) 40 MG tablet; Take 1 tablet (40 mg total) by mouth daily. PATIENT NEEDS TO MAKE AND KEEP IN OFFICE APPOINTMENT FOR ANY FURTHER REFILLS  Dispense: 90 tablet; Refill: 1 - insulin isophane & regular human KwikPen (NOVOLIN 70/30 KWIKPEN) (70-30) 100 UNIT/ML KwikPen; Inject 10 Units into the skin 2 (two) times daily with a meal.  Dispense: 15 mL; Refill: 1 - lisinopril (ZESTRIL) 5 MG tablet; Take 1 tablet (5 mg total) by mouth daily.  Dispense: 90 tablet; Refill: 1 - Semaglutide,0.25 or 0.5MG /DOS, (OZEMPIC, 0.25 OR 0.5 MG/DOSE,) 2 MG/3ML SOPN; Inject 0.5 mg into the skin once a week.  Dispense: 3 mL; Refill: 3 - Microalbumin / creatinine urine ratio - Ambulatory referral to Ophthalmology  Gastroesophageal Reflux Disease (GERD) GERD symptoms worsened with recent weight gain and dietary habits. Reports increased reflux. Advised dietary modifications to avoid spicy and fried foods. -  Refill Protonix prescription - Advise dietary modifications to avoid spicy and fried foods - pantoprazole (PROTONIX) 20 MG tablet; Take 1 tablet (20 mg total) by mouth daily.  Dispense: 90 tablet; Refill: 1 - COMPLETE METABOLIC PANEL WITH GFR; Future - CBC with Differential/Platelet; Future  Hypertension Hypertension well-controlled with current medications. Blood pressure  today is 138/74 mmHg. - Continue lisinopril 5 mg daily - Continue amlodipine 5 mg daily - TSH; Future - COMPLETE METABOLIC PANEL WITH GFR; Future - CBC with Differential/Platelet; Future - amLODipine (NORVASC) 5 MG tablet; Take 1 tablet (5 mg total) by mouth daily.  Dispense: 90 tablet; Refill: 1 - lisinopril (ZESTRIL) 5 MG tablet; Take 1 tablet (5 mg total) by mouth daily.  Dispense: 90 tablet; Refill: 1  Hyperlipidemia Hyperlipidemia managed with atorvastatin. Requires a refill. - Refill atorvastatin prescription - atorvastatin (LIPITOR) 40 MG tablet; Take 1 tablet (40 mg total) by mouth daily. PATIENT NEEDS TO MAKE AND KEEP IN OFFICE APPOINTMENT FOR ANY FURTHER REFILLS  Dispense: 90 tablet; Refill: 1 - Lipid panel; Future  Depression/anxiety  Depression well-managed on current medication. Reports doing well. - Continue Effexor 150 mg daily - TSH; Future  General Health Maintenance Due for several health maintenance screenings and vaccinations. Discussed the importance of these preventative measures. - Administer flu shot - Flu Vaccine Trivalent High Dose (Fluad) - Recommend shingles vaccine (Shingrix) at pharmacy, two doses - Refer to gastroenterology for colonoscopy - Recommend diabetic eye exam with ophthalmologist - Recommend mammogram at [facility] - Recommend COVID-19 booster at pharmacy  Follow-up - Schedule lab work for Monday next week - Schedule six-month follow-up appointment.    Family/ staff Communication: Reviewed plan of care with patient verbalized understanding   Labs/tests ordered:  - TSH; Future - COMPLETE METABOLIC PANEL WITH GFR; Future - CBC with Differential/Platelet; Future - Microalbumin / creatinine urine ratio - MM DIGITAL SCREENING BILATERAL  Next Appointment : Return in about 6 months (around 09/08/2023) for medical mangement of chronic issues., fasting labs in one week.   Caesar Bookman, NP

## 2023-03-12 LAB — MICROALBUMIN / CREATININE URINE RATIO
Creatinine, Urine: 131 mg/dL (ref 20–275)
Microalb Creat Ratio: 14 mg/g{creat} (ref <30)
Microalb, Ur: 1.8 mg/dL

## 2023-03-20 ENCOUNTER — Other Ambulatory Visit: Payer: BC Managed Care – PPO

## 2023-03-20 DIAGNOSIS — K2100 Gastro-esophageal reflux disease with esophagitis, without bleeding: Secondary | ICD-10-CM

## 2023-03-20 DIAGNOSIS — F411 Generalized anxiety disorder: Secondary | ICD-10-CM

## 2023-03-20 DIAGNOSIS — E785 Hyperlipidemia, unspecified: Secondary | ICD-10-CM

## 2023-03-20 DIAGNOSIS — I10 Essential (primary) hypertension: Secondary | ICD-10-CM

## 2023-03-20 DIAGNOSIS — F331 Major depressive disorder, recurrent, moderate: Secondary | ICD-10-CM

## 2023-03-20 DIAGNOSIS — Z794 Long term (current) use of insulin: Secondary | ICD-10-CM

## 2023-03-21 ENCOUNTER — Other Ambulatory Visit: Payer: BC Managed Care – PPO

## 2023-03-22 LAB — CBC WITH DIFFERENTIAL/PLATELET
Absolute Lymphocytes: 2527 {cells}/uL (ref 850–3900)
Absolute Monocytes: 359 {cells}/uL (ref 200–950)
Basophils Absolute: 109 {cells}/uL (ref 0–200)
Basophils Relative: 1.4 %
Eosinophils Absolute: 429 {cells}/uL (ref 15–500)
Eosinophils Relative: 5.5 %
HCT: 39.8 % (ref 35.0–45.0)
Hemoglobin: 12.4 g/dL (ref 11.7–15.5)
MCH: 25.6 pg — ABNORMAL LOW (ref 27.0–33.0)
MCHC: 31.2 g/dL — ABNORMAL LOW (ref 32.0–36.0)
MCV: 82.1 fL (ref 80.0–100.0)
MPV: 11.3 fL (ref 7.5–12.5)
Monocytes Relative: 4.6 %
Neutro Abs: 4376 {cells}/uL (ref 1500–7800)
Neutrophils Relative %: 56.1 %
Platelets: 431 10*3/uL — ABNORMAL HIGH (ref 140–400)
RBC: 4.85 10*6/uL (ref 3.80–5.10)
RDW: 11.5 % (ref 11.0–15.0)
Total Lymphocyte: 32.4 %
WBC: 7.8 10*3/uL (ref 3.8–10.8)

## 2023-03-22 LAB — COMPLETE METABOLIC PANEL WITH GFR
AG Ratio: 1.8 (calc) (ref 1.0–2.5)
ALT: 16 U/L (ref 6–29)
AST: 13 U/L (ref 10–35)
Albumin: 4.6 g/dL (ref 3.6–5.1)
Alkaline phosphatase (APISO): 93 U/L (ref 37–153)
BUN: 13 mg/dL (ref 7–25)
CO2: 25 mmol/L (ref 20–32)
Calcium: 9.7 mg/dL (ref 8.6–10.4)
Chloride: 99 mmol/L (ref 98–110)
Creat: 0.66 mg/dL (ref 0.50–1.03)
Globulin: 2.6 g/dL (ref 1.9–3.7)
Glucose, Bld: 297 mg/dL — ABNORMAL HIGH (ref 65–99)
Potassium: 4.2 mmol/L (ref 3.5–5.3)
Sodium: 133 mmol/L — ABNORMAL LOW (ref 135–146)
Total Bilirubin: 0.3 mg/dL (ref 0.2–1.2)
Total Protein: 7.2 g/dL (ref 6.1–8.1)
eGFR: 101 mL/min/{1.73_m2} (ref >=60)

## 2023-03-22 LAB — HEMOGLOBIN A1C
Hgb A1c MFr Bld: 11.9 %{Hb} — ABNORMAL HIGH (ref <5.7)
Mean Plasma Glucose: 295 mg/dL
eAG (mmol/L): 16.3 mmol/L

## 2023-03-22 LAB — LIPID PANEL
Cholesterol: 169 mg/dL (ref <200)
HDL: 59 mg/dL (ref >=50)
LDL Cholesterol (Calc): 92 mg/dL
Non-HDL Cholesterol (Calc): 110 mg/dL (ref <130)
Total CHOL/HDL Ratio: 2.9 (calc) (ref <5.0)
Triglycerides: 86 mg/dL (ref <150)

## 2023-03-22 LAB — TSH: TSH: 0.91 m[IU]/L (ref 0.40–4.50)

## 2023-03-24 ENCOUNTER — Telehealth: Payer: Self-pay | Admitting: Family Medicine

## 2023-03-24 NOTE — Telephone Encounter (Signed)
 Patient called asking if Dr Alease Hunter would be able to write her a note for work in regards to missing work today. She said that she has been having worsening arm pain and had to call out of work today. She is going tomorrow and scheduled an appointment to see Dr Alease Hunter on Monday, 2/17.  She said that she can get the letter off MyChart when ready.

## 2023-03-25 ENCOUNTER — Encounter: Payer: Self-pay | Admitting: Family Medicine

## 2023-03-25 NOTE — Telephone Encounter (Signed)
MyChart message sent to pt that letter is available via MyChart.

## 2023-03-26 ENCOUNTER — Encounter: Payer: Self-pay | Admitting: Family Medicine

## 2023-03-26 NOTE — Telephone Encounter (Signed)
Forwarding to Dr. Denyse Amass to review and advise if OK to provide additional work note as pt has not been seen since 2023.

## 2023-03-27 NOTE — Telephone Encounter (Signed)
Please note she needs to be excused for 03/26/2023 as well due to same issue.

## 2023-03-28 ENCOUNTER — Ambulatory Visit: Payer: BC Managed Care – PPO | Admitting: Family

## 2023-03-28 ENCOUNTER — Encounter: Payer: Self-pay | Admitting: Family

## 2023-03-28 VITALS — BP 140/80 | HR 100 | Temp 98.3°F | Resp 20 | Ht 62.0 in | Wt 141.0 lb

## 2023-03-28 DIAGNOSIS — E1165 Type 2 diabetes mellitus with hyperglycemia: Secondary | ICD-10-CM

## 2023-03-28 DIAGNOSIS — E871 Hypo-osmolality and hyponatremia: Secondary | ICD-10-CM | POA: Diagnosis not present

## 2023-03-28 DIAGNOSIS — Z794 Long term (current) use of insulin: Secondary | ICD-10-CM | POA: Diagnosis not present

## 2023-03-28 MED ORDER — NOVOLIN 70/30 FLEXPEN (70-30) 100 UNIT/ML ~~LOC~~ SUPN: 12.0000 [IU] | PEN_INJECTOR | Freq: Two times a day (BID) | SUBCUTANEOUS | 1 refills | Status: DC

## 2023-03-28 NOTE — Progress Notes (Signed)
Provider: Richarda Blade FNP-C  Elaysha Bevard, Donalee Citrin, NP  Patient Care Team: Lashundra Shiveley, Donalee Citrin, NP as PCP - General (Family Medicine)  Extended Emergency Contact Information Primary Emergency Contact: Gaither,Gladys Address: [address]          [place], [place] [address] D[place] of [place] Phone: [phone] Relation: Mother Secondary Emergency Contact: Batalla,Forey Mobile Phone: [phone] Relation: Brother  Code Status:  Full Code  Goals of care: Advanced Directive information    03/28/2023    3:41 PM  Advanced Directives  Does Patient Have a Medical Advance Directive? No  Would patient like information on creating a medical advance directive? No - Patient declined     Chief Complaint  Patient presents with   Medical Management of Chronic Issues    Discuss results. Discuss the colonoscopy, foot exam, covid, eye exam, mammogram and shingles.     Discussed the use of AI scribe software for clinical note transcription with the patient, who gave verbal consent to proceed.  History of Present Illness   Brenda [name] is a 58 year old female with diabetes who presents for lab work follow-up.  She is here for a follow-up on her diabetes management. Her fasting glucose level is significantly elevated at 297 mg/dL, and her hemoglobin Z3Y has increased from 8.7% a year ago to 11.9% currently. She has not started on Ozempic due to cost issues but is using 70/30 insulin at a dose of 10 units twice daily. Her blood sugar readings are typically in the 200s to 300s. She experiences feeling 'really hot' sometimes after taking insulin but denies feeling shaky or drowsy during these episodes.  Her complete blood count shows stable hemoglobin levels at 12.4 g/dL, slightly decreased from 12.5 g/dL previously. Her platelet count has decreased from 444,000 to 431,000. Her sodium level is slightly low at 133 mmol/L, and she reports drinking a lot of water. Other electrolytes, kidney  function, liver function, and thyroid levels are within normal limits. Her cholesterol levels are well-controlled with a total cholesterol of 169 mg/dL, HDL of 59 mg/dL, triglycerides of 86 mg/dL, and LDL of 92 mg/dL.  She acknowledges a diet high in fried foods and is working on reducing her intake of carbohydrates and sweets. She primarily drinks water and occasionally ginger ale, and is considering alternatives like lemon water to avoid sugary drinks. She attributes her good HDL levels to regular exercise, including taking the stairs.    Past Medical History:  Diagnosis Date   Allergic rhinitis, cause unspecified    Anemia, unspecified    Diabetes mellitus without complication (HCC)    Esophageal reflux    Generalized anxiety disorder    Headache(784.0)    Hypertension    Pure hypercholesterolemia    Rash and other nonspecific skin eruption    Unspecified vitamin D deficiency    Past Surgical History:  Procedure Laterality Date   TENDON REPAIR  1990   left index finger    Allergies  Allergen Reactions   Codeine Phosphate     REACTION: vomiting    Outpatient Encounter Medications as of 03/28/2023  Medication Sig   amLODipine (NORVASC) 5 MG tablet Take 1 tablet (5 mg total) by mouth daily.   atorvastatin (LIPITOR) 40 MG tablet Take 1 tablet (40 mg total) by mouth daily. PATIENT NEEDS TO MAKE AND KEEP IN OFFICE APPOINTMENT FOR ANY FURTHER REFILLS   blood glucose meter kit and supplies KIT Dispense based on patient and insurance preference.  Use before breakfast and at bedtime. E11.4   estradiol (CLIMARA - DOSED IN MG/24 HR) 0.025 mg/24hr patch PLACE 1 PATCH ONTO THE SKIN ONCE A WEEK. NEEDS TO SCHEDULE MAMMOGRAM FOR ADDITIONAL REFILLS   fluticasone (FLONASE) 50 MCG/ACT nasal spray Place 2 sprays into both nostrils daily.   ibuprofen (ADVIL) 800 MG tablet Take 1 tablet (800 mg total) by mouth every 8 (eight) hours as needed.   Insulin Pen Needle (BD PEN NEEDLE NANO 2ND GEN) 32G X  4 MM MISC USE WITH INSULIN PEN AS DIRECTED   lisinopril (ZESTRIL) 5 MG tablet Take 1 tablet (5 mg total) by mouth daily.   ondansetron (ZOFRAN) 4 MG tablet Take 1 tablet (4 mg total) by mouth every 6 (six) hours.   ONETOUCH VERIO test strip USE BEFORE BREAKFAST AND AT BEDTIME.   pantoprazole (PROTONIX) 20 MG tablet Take 1 tablet (20 mg total) by mouth daily.   Semaglutide,0.25 or 0.5MG /DOS, (OZEMPIC, 0.25 OR 0.5 MG/DOSE,) 2 MG/3ML SOPN Inject 0.5 mg into the skin once a week.   tiZANidine (ZANAFLEX) 4 MG tablet Take 0.5-1 tablets (2-4 mg total) by mouth every 8 (eight) hours as needed for muscle spasms.   venlafaxine XR (EFFEXOR-XR) 150 MG 24 hr capsule TAKE 1 CAPSULE (150 MG TOTAL) BY MOUTH DAILY WITH BREAKFAST. PATIENT NEEDS TO MAKE AND KEEP IN OFFICE APPOINTMENT FOR ANY FURTHER REFILLS   [DISCONTINUED] insulin isophane & regular human KwikPen (NOVOLIN 70/30 KWIKPEN) (70-30) 100 UNIT/ML KwikPen Inject 10 Units into the skin 2 (two) times daily with a meal.   insulin isophane & regular human KwikPen (NOVOLIN 70/30 KWIKPEN) (70-30) 100 UNIT/ML KwikPen Inject 12 Units into the skin 2 (two) times daily with a meal.   [DISCONTINUED] omeprazole (PRILOSEC OTC) 20 MG tablet Take 1 tablet (20 mg total) by mouth daily.   No facility-administered encounter medications on file as of 03/28/2023.    Review of Systems  Constitutional:  Negative for appetite change, chills, fatigue, fever and unexpected weight change.  HENT:  Negative for congestion, dental problem, ear discharge, ear pain, facial swelling, hearing loss, nosebleeds, postnasal drip, rhinorrhea, sinus pressure, sinus pain, sneezing and sore throat.   Eyes:  Negative for pain, discharge, redness, itching and visual disturbance.  Respiratory:  Negative for cough, chest tightness, shortness of breath and wheezing.   Cardiovascular:  Negative for chest pain, palpitations and leg swelling.  Gastrointestinal:  Negative for abdominal distention,  abdominal pain, constipation, nausea and vomiting.  Endocrine: Negative for cold intolerance, heat intolerance, polydipsia, polyphagia and polyuria.  Genitourinary:  Negative for difficulty urinating, dysuria, flank pain, frequency and urgency.  Skin:  Negative for color change, pallor and rash.  Neurological:  Negative for dizziness, syncope, speech difficulty, weakness, light-headedness, numbness and headaches.    Immunization History  Administered Date(s) Administered   Fluad Quad(high Dose 65+) 11/22/2020, 11/20/2021   Fluad Trivalent(High Dose 65+) 03/11/2023   Influenza Split 02/15/2011   Influenza Whole 11/09/2007, 12/01/2008   Influenza,inj,Quad PF,6+ Mos 10/21/2013, 03/22/2015, 10/17/2015, 03/12/2019   PFIZER(Purple Top)SARS-COV-2 Vaccination 06/03/2019, 09/07/2019   PNEUMOCOCCAL CONJUGATE-20 01/15/2021   Pneumococcal Polysaccharide-23 12/30/2016   Tdap 03/22/2015, 09/13/2017   Pertinent  Health Maintenance Due  Topic Date Due   OPHTHALMOLOGY EXAM  Never done   Colonoscopy  04/02/2010   MAMMOGRAM  Never done   FOOT EXAM  11/22/2021   HEMOGLOBIN A1C  09/18/2023   INFLUENZA VACCINE  Completed      11/20/2021    3:14 PM 12/26/2021   11:10  AM 09/16/2022    2:42 PM 03/11/2023    3:38 PM 03/28/2023    3:40 PM  Fall Risk  Falls in the past year? 0 0 0 0 0  Was there an injury with Fall? 0 0 0 0 0  Fall Risk Category Calculator 0 0 0 0 0  Fall Risk Category (Retired) Low Low     (RETIRED) Patient Fall Risk Level Low fall risk Low fall risk     Patient at Risk for Falls Due to No Fall Risks No Fall Risks No Fall Risks  No Fall Risks  Fall risk Follow up Falls evaluation completed Falls evaluation completed Falls evaluation completed  Falls evaluation completed   Functional Status Survey:    Vitals:   03/28/23 1538  BP: (!) 140/80  Pulse: 100  Resp: 20  Temp: 98.3 F (36.8 C)  SpO2: 98%  Weight: 141 lb (64 kg)  Height: 5\' 2"  (1.575 m)   Body mass index is 25.79  kg/m. Physical Exam Vitals reviewed.  Constitutional:      General: She is not in acute distress.    Appearance: Normal appearance. She is overweight. She is not ill-appearing or diaphoretic.  HENT:     Head: Normocephalic.     Mouth/Throat:     Mouth: Mucous membranes are moist.     Pharynx: Oropharynx is clear. No oropharyngeal exudate or posterior oropharyngeal erythema.  Eyes:     General: No scleral icterus.       Right eye: No discharge.        Left eye: No discharge.     Conjunctiva/sclera: Conjunctivae normal.     Pupils: Pupils are equal, round, and reactive to light.  Neck:     Vascular: No carotid bruit.  Cardiovascular:     Rate and Rhythm: Normal rate and regular rhythm.     Pulses: Normal pulses.     Heart sounds: Normal heart sounds. No murmur heard.    No friction rub. No gallop.  Pulmonary:     Effort: Pulmonary effort is normal. No respiratory distress.     Breath sounds: Normal breath sounds. No wheezing, rhonchi or rales.  Chest:     Chest wall: No tenderness.  Abdominal:     General: Bowel sounds are normal. There is no distension.     Palpations: Abdomen is soft. There is no mass.     Tenderness: There is no abdominal tenderness. There is no right CVA tenderness, left CVA tenderness, guarding or rebound.  Musculoskeletal:        General: No swelling or tenderness. Normal range of motion.     Cervical back: Normal range of motion. No rigidity or tenderness.     Right lower leg: No edema.     Left lower leg: No edema.  Lymphadenopathy:     Cervical: No cervical adenopathy.  Skin:    General: Skin is warm and dry.     Coloration: Skin is not pale.     Findings: No erythema or rash.  Neurological:     Mental Status: She is alert and oriented to person, place, and time.     Gait: Gait normal.  Psychiatric:        Mood and Affect: Mood normal.        Speech: Speech normal.        Behavior: Behavior normal.    Physical Exam   CARDIOVASCULAR: Heart  murmur present, regular rhythm. ABDOMEN: Abdomen non-tender. EXTREMITIES: No swelling in  extremities.      Labs reviewed: Recent Labs    03/21/23 0845  NA 133*  K 4.2  CL 99  CO2 25  GLUCOSE 297*  BUN 13  CREATININE 0.66  CALCIUM 9.7   Recent Labs    03/21/23 0845  AST 13  ALT 16  BILITOT 0.3  PROT 7.2   Recent Labs    03/21/23 0845  WBC 7.8  NEUTROABS 4,376  HGB 12.4  HCT 39.8  MCV 82.1  PLT 431*   Lab Results  Component Value Date   TSH 0.91 03/21/2023   Lab Results  Component Value Date   HGBA1C 11.9 (H) 03/21/2023   Lab Results  Component Value Date   CHOL 169 03/21/2023   HDL 59 03/21/2023   LDLCALC 92 03/21/2023   LDLDIRECT 99.0 11/22/2020   TRIG 86 03/21/2023   CHOLHDL 2.9 03/21/2023    Significant Diagnostic Results in last 30 days:  No results found.  Assessment/Plan  Type 2 Diabetes Mellitus Elevated fasting glucose at 297 mg/dL and hemoglobin Z6X at 09.6%, indicating poor glycemic control. Currently on 70/30 insulin at 10 units twice daily. Blood glucose levels consistently 200-300 mg/dL. Reports feeling hot after insulin administration but denies shakiness or drowsiness. Discussed increasing insulin dosage and starting Ozempic to improve glycemic control. Emphasized dietary modifications and continued exercise. - Increase 70/30 insulin to 12 units twice daily for one week, then to 14 units twice daily - Start Ozempic 0.5 mg once weekly - Follow up in one month to assess blood glucose levels and response to Ozempic - Advise monitoring blood glucose levels, especially when feeling hot - Discuss dietary modifications to reduce carbohydrate and fried food intake - Encourage continued exercise to maintain HDL levels  Hyponatremia Sodium level at 133 mmol/L, likely due to excessive water intake. Advised to limit water intake to prevent further sodium depletion. - Limit water intake to 6-8 glasses per day - Recommend drinking Gatorade only  after exercise to replenish electrolytes - recheck BMP in 1 month   General Health Maintenance Overall good health with well-controlled cholesterol levels, normal kidney and liver function, and regular exercise. - Continue taking a multivitamin - Maintain regular exercise routine - Monitor diet to ensure balanced nutrition  Follow-up - Schedule follow-up appointment in one month - Contact via MyChat if any questions or issues arise before the follow-up.   Family/ staff Communication: Reviewed plan of care with patient  Labs/tests ordered: None   Next Appointment: Return in about 1 month (around 04/25/2023) for Type 2 diabetes .   Total time: 15 minutes. Greater than 50% of total time spent doing patient education regarding abnormal lab results,health maintenance including symptom/medication management.   Caesar Bookman, NP

## 2023-03-31 ENCOUNTER — Ambulatory Visit: Payer: BC Managed Care – PPO | Admitting: Family Medicine

## 2023-03-31 ENCOUNTER — Other Ambulatory Visit: Payer: Self-pay

## 2023-03-31 ENCOUNTER — Ambulatory Visit (INDEPENDENT_AMBULATORY_CARE_PROVIDER_SITE_OTHER): Payer: BC Managed Care – PPO

## 2023-03-31 DIAGNOSIS — G8929 Other chronic pain: Secondary | ICD-10-CM

## 2023-03-31 DIAGNOSIS — M25511 Pain in right shoulder: Secondary | ICD-10-CM

## 2023-03-31 NOTE — Patient Instructions (Signed)
Thank you for coming in today.   Call or go to the ER if you develop a large red swollen joint with extreme pain or oozing puss.    Please get an Xray today before you leave   If this does not work let me know and we can add PT.

## 2023-03-31 NOTE — Progress Notes (Unsigned)
I, Rolland Bimler am a scribe for Dr. Denyse Amass.   Brenda [name] is a 58 y.o. female who presents to [facility] at [facility] today for worsening arm pain. Pt was last seen by Dr. Denyse Amass on 09/28/21 and pain was improving, so she was advised to use a heating pad and tizanidine.  Today, pt reports 6 out of 10 pain today. Constant pain that is radiating up her neck. No numbness or tingling down the arm. Pain keeps her awake at night. Doesn't stop her from doing things during the day just at night. Not using the heating pad. Using tizanidine at night as needed but half the dosage because it makes her drowsy.   Dx imaging: 09/28/21 C-spine & T-spine XR  Pertinent review of systems: No fevers or chills  Relevant historical information: Hypertension   Exam:  BP (!) 140/70   Pulse 94   Ht 5\' 2"  (1.575 m)   Wt 139 lb 12.8 oz (63.4 kg)   LMP 11/26/2013   SpO2 96%   BMI 25.57 kg/m  General: Well Developed, well nourished, and in no acute distress.   MSK: C-spine: Normal appearing Nontender to palpation midline.  Tender palpation right cervical paraspinal musculature. Decreased cervical motion. Upper extremity strength is generally intact. Reflexes are equal.  Right shoulder normal-appearing Tender palpation superior shoulder. Normal motion pain with abduction. Positive Hawkins and Neer's test.  Positive empty can test. Strength is intact.  Pulses and capillary refill are intact distally.    Lab and Radiology Results  Procedure: Real-time Ultrasound Guided Injection of right shoulder subacromial bursa Device: Philips Affiniti 50G/GE Logiq Images permanently stored and available for review in PACS Verbal informed consent obtained.  Discussed risks and benefits of procedure. Warned about infection, bleeding, hyperglycemia damage to structures among others. Patient expresses understanding and agreement Time-out conducted.   Noted no overlying erythema,  induration, or other signs of local infection.   Skin prepped in a sterile fashion.   Local anesthesia: Topical Ethyl chloride.   With sterile technique and under real time ultrasound guidance: 40 mg of Kenalog and 2 mL of Marcaine injected into subacromial bursa. Fluid seen entering the bursa.   Completed without difficulty   Pain immediately resolved suggesting accurate placement of the medication.   Advised to call if fevers/chills, erythema, induration, drainage, or persistent bleeding.   Images permanently stored and available for review in the ultrasound unit.  Impression: Technically successful ultrasound guided injection.    X-ray images right shoulder obtained today personally and independently interpreted. Mild glenohumeral DJD.  Chronic calcific change at humeral head rotator cuff attachment site.  No acute fractures. Await formal radiology review     Assessment and Plan: 58 y.o. female with chronic right shoulder pain.  Pain is multifactorial.  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  That would explain the upper arm pain and treatable with subacromial bursa injection performed above.  However she also has pain around the lateral neck perispinal musculature is also contributory.  If not improved with injection and a bit of time we can add physical therapy.  I think that would be helpful.   PDMP not reviewed this encounter. Orders Placed This Encounter  Procedures   [place] LIMITED JOINT SPACE STRUCTURES UP RIGHT(NO LINKED CHARGES)    Reason for Exam (SYMPTOM  OR DIAGNOSIS REQUIRED):   right shoulder pain    Preferred imaging location?:   [facility] Shoulder Right    Standing Status:   Future    Number of Occurrences:   1    Expiration Date:   03/30/2024    Reason for Exam (SYMPTOM  OR DIAGNOSIS REQUIRED):   right shoulder pain    Is patient pregnant?:   No    Preferred  imaging location?:   [facility]   No orders of the defined types were placed in this encounter.    Discussed warning signs or symptoms. Please see discharge instructions. Patient expresses understanding.   The above documentation has been reviewed and is accurate and complete Clementeen Graham, M.D.

## 2023-04-16 ENCOUNTER — Encounter: Payer: Self-pay | Admitting: Family Medicine

## 2023-04-16 NOTE — Progress Notes (Signed)
 Right shoulder x-ray shows mild arthritis and potential for chronic calcific tendinitis

## 2023-04-21 ENCOUNTER — Telehealth: Payer: Self-pay

## 2023-04-21 ENCOUNTER — Encounter: Payer: Self-pay | Admitting: Family Medicine

## 2023-04-21 NOTE — Telephone Encounter (Signed)
 Message sent to pt, she will have [employer] send new/blank forms.

## 2023-04-21 NOTE — Telephone Encounter (Signed)
-----   Message -----      From:Keyaira A Knopf      Sent:04/21/2023  5:15 AM EDT        GE:[employer] Denyse Amass   Subject:Aasha Hurshel Keys Dr. Denyse Amass could you update my fmla please and  and send it to [employer]. Thanks

## 2023-04-23 ENCOUNTER — Telehealth: Payer: Self-pay

## 2023-04-23 NOTE — Telephone Encounter (Signed)
 Copied from CRM [id number]. Topic: General - Other >> Apr 22, 2023  9:49 AM [name] wrote: Reason for CRM: Patient just called in for the fax number of the office. >> Apr 23, 2023  2:05 PM [name] wrote: Patient want to know  the status if her FMLA paperwork is been completed    I will forward this to [place] (medical assistant working clinical intake yesterday) to further follow-up and respond to patient

## 2023-04-24 NOTE — Telephone Encounter (Signed)
 FMLA paperwork already completed and given to adm staff to fax and notify patient if need to be picked up.

## 2023-04-24 NOTE — Telephone Encounter (Signed)
 Noted.

## 2023-04-24 NOTE — Telephone Encounter (Signed)
 Copied from CRM [id number]. Topic: General - Other >> Apr 22, 2023  9:49 AM [name] wrote: Reason for CRM: Patient just called in for the fax number of the office. >> Apr 23, 2023  2:05 PM [name] wrote: Patient want to know  the status if her FMLA paperwork is been completed      I will forward this to [place] (medical assistant working clinical intake yesterday) to further follow-up and respond to patient   Left a voicemail for the patient to let her know the FMLA paperwork has been completed and signed by Caesar Bookman, NP and faxed in the office by [employer].  Message sent to [place], Donalee Citrin, NP

## 2023-04-24 NOTE — Telephone Encounter (Signed)
FMLA form completed and placed on Dr. Zollie Pee desk to review and sign.

## 2023-04-25 NOTE — Telephone Encounter (Signed)
 Form reviewed and signed by Dr. Denyse Amass, placed at the front desk for faxing/scanning.

## 2023-04-28 ENCOUNTER — Encounter: Payer: BC Managed Care – PPO | Admitting: Family

## 2023-05-04 NOTE — Progress Notes (Signed)
   This encounter was created in error - please disregard. No show

## 2023-05-09 ENCOUNTER — Encounter: Admitting: Family

## 2023-05-09 NOTE — Progress Notes (Signed)
   This encounter was created in error - please disregard. No show

## 2023-05-09 NOTE — Telephone Encounter (Signed)
 Message routed to front admin

## 2023-05-14 ENCOUNTER — Telehealth: Payer: Self-pay

## 2023-05-14 NOTE — Telephone Encounter (Signed)
 Corliss Skains #[id number]

## 2023-05-15 NOTE — Telephone Encounter (Signed)
 Form completed and placed on Dr. Zollie Pee desk to review and sign.   Continuous leave 04/21/23-04/23/23.   Intermittent time off for flare-ups and to attend appointments 04/24/23-04/23/24.

## 2023-05-16 NOTE — Telephone Encounter (Signed)
Form at the front desk for faxing/scanning.

## 2023-05-20 ENCOUNTER — Encounter: Payer: Self-pay | Admitting: Family

## 2023-05-20 ENCOUNTER — Encounter: Admitting: Family

## 2023-05-20 ENCOUNTER — Telehealth (INDEPENDENT_AMBULATORY_CARE_PROVIDER_SITE_OTHER): Admitting: Family

## 2023-05-20 ENCOUNTER — Ambulatory Visit: Payer: Self-pay

## 2023-05-20 DIAGNOSIS — R051 Acute cough: Secondary | ICD-10-CM | POA: Diagnosis not present

## 2023-05-20 DIAGNOSIS — J0101 Acute recurrent maxillary sinusitis: Secondary | ICD-10-CM

## 2023-05-20 DIAGNOSIS — J301 Allergic rhinitis due to pollen: Secondary | ICD-10-CM

## 2023-05-20 MED ORDER — LORATADINE 10 MG PO TABS
10.0000 mg | ORAL_TABLET | Freq: Every day | ORAL | 11 refills | Status: AC
Start: 2023-05-20 — End: ?

## 2023-05-20 MED ORDER — DOXYCYCLINE HYCLATE 100 MG PO TABS: 100.0000 mg | ORAL_TABLET | Freq: Two times a day (BID) | ORAL | 0 refills | Status: AC

## 2023-05-20 MED ORDER — GUAIFENESIN-DM 100-10 MG/5ML PO SYRP: 5.0000 mL | ORAL_SOLUTION | Freq: Three times a day (TID) | ORAL | 0 refills | Status: DC | PRN

## 2023-05-20 NOTE — Telephone Encounter (Signed)
 Left message on voicemail for patient to return call when available. See Triage Notes from Triage Nurse   Message sent to Ngetich, Donalee Citrin, NP

## 2023-05-20 NOTE — Telephone Encounter (Signed)
 Received another form to be completed today. Last form was just completed and faxed on 05/16/23.   I will need to reach out to [employer] about this.

## 2023-05-20 NOTE — Telephone Encounter (Signed)
 Patient called, left VM to return the call to the office to speak to NT.  Patient has an appointment scheduled today at 1500 with PCP.   Copied from CRM [id number]. Topic: Clinical - Medical Advice >> May 20, 2023  9:51 AM Adrianna P wrote: Reason for CRM: patient is experiencing coughing, sinus drainage, and would like dr to prescribe something for her.

## 2023-05-20 NOTE — Telephone Encounter (Signed)
  Chief Complaint: sinus pain & congestion Symptoms: 4/10 headache above eyes, bilateral earache, nasal congestion, runny nose, dry cough, fever Frequency: Saturday  Pertinent Negatives: Patient denies chest pain Disposition: [] ED /[] Urgent Care (no appt availability in office) / [x] Appointment(In office/virtual)/ []  [facility]/ [] Home Care/ [] Refused Recommended Disposition /[] [facility]/ []  Follow-up with PCP Additional Notes: pt has been taking tylenol and drinking natural teas to help with congestion, headaches, etc.  Would like to be seen by PCP to be evaluated.  Scheduled video visit for today.  Reason for Disposition  Fever present > 3 days (72 hours)  Answer Assessment - Initial Assessment Questions 1. LOCATION: "Where does it hurt?"     4/10 Headache - above bilateral eyes 2. ONSET: "When did the sinus pain start?"  (e.g., hours, days)      Saturday 3. SEVERITY: "How bad is the pain?"   (Scale 1-10; mild, moderate or severe)   - MILD (1-3): doesn't interfere with normal activities    - MODERATE (4-7): interferes with normal activities (e.g., work or school) or awakens from sleep   - SEVERE (8-10): excruciating pain and patient unable to do any normal activities        moderate 4. RECURRENT SYMPTOM: "Have you ever had sinus problems before?" If Yes, ask: "When was the last time?" and "What happened that time?"      no 5. NASAL CONGESTION: "Is the nose blocked?" If Yes, ask: "Can you open it or must you breathe through your mouth?"     Stuffy nose 6. NASAL DISCHARGE: "Do you have discharge from your nose?" If so ask, "What color?"     clear 7. FEVER: "Do you have a fever?" If Yes, ask: "What is it, how was it measured, and when did it start?"  Yes and chills 8. OTHER SYMPTOMS: "Do you have any other symptoms?" (e.g., sore throat, cough, earache, difficulty breathing)     Cough - dry, bilateral earache, drainage, runny nose and stuffy nose 9. PREGNANCY:  "Is there any chance you are pregnant?" "When was your last menstrual period?"     N/a  Protocols used: Sinus Pain or Congestion-A-AH

## 2023-05-20 NOTE — Telephone Encounter (Signed)
  3rd attempt, left voicemail with call back # for patient to return nurse triage call. Routing to clinic and closing encounter.  Copied from CRM [id number]. Topic: Clinical - Medical Advice >> May 20, 2023  9:51 AM Adrianna P wrote: Reason for CRM: patient is experiencing coughing, sinus drainage, and would like dr to prescribe something for her. Reason for Disposition . Third attempt to contact caller AND no contact made. Phone number verified.  Protocols used: No Contact or Duplicate Contact Call-A-AH

## 2023-05-20 NOTE — Telephone Encounter (Signed)
 Called [employer] and was advised that there are currently 2 open claims. They were unable to tell me which issue was associated with which leave number. I had to ask to speak with case worker and was told that they would have to call me back.

## 2023-05-20 NOTE — Progress Notes (Signed)
 This service is provided via telemedicine  No vital signs collected/recorded due to the encounter was a telemedicine visit.   Location of patient (ex: home, work):  Home  Patient consents to a telephone visit: Yes  Location of the provider (ex: office, home):  [facility], Office   Name of any referring provider:  N/A  Names of all persons participating in the telemedicine service and their role in the encounter:  Ronald Pippins, CMA, Patient, and   Time spent on call:  9 min with medical assistant  Marticia Reifschneider, Donalee Citrin, NP     Provider: Richarda Blade FNP-C  Jdyn Parkerson, Donalee Citrin, NP  Patient Care Team: Raini Tiley, Donalee Citrin, NP as PCP - General (Family Medicine)  Extended Emergency Contact Information Primary Emergency Contact: Gaither,Gladys Address: [address]          [place], [place] [address] [place] of [place] Mobile Phone: [phone] Relation: Mother Secondary Emergency Contact: Hilmer,Forey Mobile Phone: [phone] Relation: Brother  Code Status:  Full Code  Goals of care: Advanced Directive information    05/20/2023    3:06 PM  Advanced Directives  Does Patient Have a Medical Advance Directive? No  Would patient like information on creating a medical advance directive? No - Patient declined     Chief Complaint  Patient presents with   Acute Visit    Sinus pain and congestion    HPI:  Pt is a 58 y.o. female seen today for an acute visit for evaluation of sinus pain and congestion x 1 week.pain associated with headache located across forehead.Also has a dry cough with PND.Has sore throat at the beginning of symptoms but resolved.symptoms have worsen with recent increase in pollen.Has used Flonase without any relief.Also took an allergy pill. She denies any fever or chills.  Past Medical History:  Diagnosis Date   Allergic rhinitis, cause unspecified    Anemia, unspecified    Diabetes mellitus without complication (HCC)     Esophageal reflux    Generalized anxiety disorder    Headache(784.0)    Hypertension    Pure hypercholesterolemia    Rash and other nonspecific skin eruption    Unspecified vitamin D deficiency    Past Surgical History:  Procedure Laterality Date   TENDON REPAIR  1990   left index finger    Allergies  Allergen Reactions   Codeine Phosphate     REACTION: vomiting    Outpatient Encounter Medications as of 05/20/2023  Medication Sig   amLODipine (NORVASC) 5 MG tablet Take 1 tablet (5 mg total) by mouth daily.   atorvastatin (LIPITOR) 40 MG tablet Take 1 tablet (40 mg total) by mouth daily. PATIENT NEEDS TO MAKE AND KEEP IN OFFICE APPOINTMENT FOR ANY FURTHER REFILLS   blood glucose meter kit and supplies KIT Dispense based on patient and insurance preference.  Use before breakfast and at bedtime. E11.4   doxycycline (VIBRA-TABS) 100 MG tablet Take 1 tablet (100 mg total) by mouth 2 (two) times daily for 7 days.   estradiol (CLIMARA - DOSED IN MG/24 HR) 0.025 mg/24hr patch PLACE 1 PATCH ONTO THE SKIN ONCE A WEEK. NEEDS TO SCHEDULE MAMMOGRAM FOR ADDITIONAL REFILLS   fluticasone (FLONASE) 50 MCG/ACT nasal spray Place 2 sprays into both nostrils daily.   guaiFENesin-dextromethorphan (ROBITUSSIN DM) 100-10 MG/5ML syrup Take 5 mLs by mouth every 8 (eight) hours as needed for cough.   ibuprofen (ADVIL) 800 MG tablet Take 1 tablet (800 mg total) by mouth  every 8 (eight) hours as needed.   insulin isophane & regular human KwikPen (NOVOLIN 70/30 KWIKPEN) (70-30) 100 UNIT/ML KwikPen Inject 12 Units into the skin 2 (two) times daily with a meal.   Insulin Pen Needle (BD PEN NEEDLE NANO 2ND GEN) 32G X 4 MM MISC USE WITH INSULIN PEN AS DIRECTED   lisinopril (ZESTRIL) 5 MG tablet Take 1 tablet (5 mg total) by mouth daily.   loratadine (CLARITIN) 10 MG tablet Take 1 tablet (10 mg total) by mouth daily.   ondansetron (ZOFRAN) 4 MG tablet Take 1 tablet (4 mg total) by mouth every 6 (six) hours.    ONETOUCH VERIO test strip USE BEFORE BREAKFAST AND AT BEDTIME.   pantoprazole (PROTONIX) 20 MG tablet Take 1 tablet (20 mg total) by mouth daily.   Semaglutide,0.25 or 0.5MG /DOS, (OZEMPIC, 0.25 OR 0.5 MG/DOSE,) 2 MG/3ML SOPN Inject 0.5 mg into the skin once a week.   tiZANidine (ZANAFLEX) 4 MG tablet Take 0.5-1 tablets (2-4 mg total) by mouth every 8 (eight) hours as needed for muscle spasms.   venlafaxine XR (EFFEXOR-XR) 150 MG 24 hr capsule TAKE 1 CAPSULE (150 MG TOTAL) BY MOUTH DAILY WITH BREAKFAST. PATIENT NEEDS TO MAKE AND KEEP IN OFFICE APPOINTMENT FOR ANY FURTHER REFILLS   [DISCONTINUED] omeprazole (PRILOSEC OTC) 20 MG tablet Take 1 tablet (20 mg total) by mouth daily.   No facility-administered encounter medications on file as of 05/20/2023.    Review of Systems  Constitutional:  Negative for appetite change, chills, fatigue, fever and unexpected weight change.  HENT:  Positive for congestion, postnasal drip, sinus pressure and sinus pain. Negative for dental problem, ear discharge, ear pain, facial swelling, hearing loss, nosebleeds, rhinorrhea, sneezing, sore throat and tinnitus.   Eyes:  Negative for pain, discharge, redness, itching and visual disturbance.  Respiratory:  Positive for cough. Negative for chest tightness, shortness of breath and wheezing.   Cardiovascular:  Negative for chest pain, palpitations and leg swelling.  Gastrointestinal:  Negative for abdominal distention, abdominal pain, constipation, diarrhea, nausea and vomiting.  Skin:  Negative for color change, pallor and rash.  Neurological:  Negative for dizziness, weakness, light-headedness, numbness and headaches.    Immunization History  Administered Date(s) Administered   Fluad Quad(high Dose 65+) 11/22/2020, 11/20/2021   Fluad Trivalent(High Dose 65+) 03/11/2023   Influenza Split 02/15/2011   Influenza Whole 11/09/2007, 12/01/2008   Influenza,inj,Quad PF,6+ Mos 10/21/2013, 03/22/2015, 10/17/2015, 03/12/2019    PFIZER(Purple Top)SARS-COV-2 Vaccination 06/03/2019, 09/07/2019   PNEUMOCOCCAL CONJUGATE-20 01/15/2021   Pneumococcal Polysaccharide-23 12/30/2016   Tdap 03/22/2015, 09/13/2017   Pertinent  Health Maintenance Due  Topic Date Due   OPHTHALMOLOGY EXAM  Never done   Colonoscopy  04/02/2010   MAMMOGRAM  Never done   FOOT EXAM  11/22/2021   INFLUENZA VACCINE  09/12/2023   HEMOGLOBIN A1C  09/18/2023      12/26/2021   11:10 AM 09/16/2022    2:42 PM 03/11/2023    3:38 PM 03/28/2023    3:40 PM 05/20/2023    3:07 PM  Fall Risk  Falls in the past year? 0 0 0 0 0  Was there an injury with Fall? 0 0 0 0 0  Fall Risk Category Calculator 0 0 0 0 0  Fall Risk Category (Retired) Low      (RETIRED) Patient Fall Risk Level Low fall risk      Patient at Risk for Falls Due to No Fall Risks No Fall Risks  No Fall Risks No Fall Risks  Fall risk Follow up Falls evaluation completed Falls evaluation completed  Falls evaluation completed Falls evaluation completed   Functional Status Survey:    There were no vitals filed for this visit. There is no height or weight on file to calculate BMI. Physical Exam Constitutional:      General: She is not in acute distress.    Appearance: She is not ill-appearing.  Pulmonary:     Effort: Pulmonary effort is normal. No respiratory distress.  Neurological:     Mental Status: She is alert and oriented to person, place, and time.     Gait: Gait normal.  Psychiatric:        Mood and Affect: Mood normal.        Behavior: Behavior normal.    Labs reviewed: Recent Labs    03/21/23 0845  NA 133*  K 4.2  CL 99  CO2 25  GLUCOSE 297*  BUN 13  CREATININE 0.66  CALCIUM 9.7   Recent Labs    03/21/23 0845  AST 13  ALT 16  BILITOT 0.3  PROT 7.2   Recent Labs    03/21/23 0845  WBC 7.8  NEUTROABS 4,376  HGB 12.4  HCT 39.8  MCV 82.1  PLT 431*   Lab Results  Component Value Date   TSH 0.91 03/21/2023   Lab Results  Component Value Date    HGBA1C 11.9 (H) 03/21/2023   Lab Results  Component Value Date   CHOL 169 03/21/2023   HDL 59 03/21/2023   LDLCALC 92 03/21/2023   LDLDIRECT 99.0 11/22/2020   TRIG 86 03/21/2023   CHOLHDL 2.9 03/21/2023    Significant Diagnostic Results in last 30 days:  No results found.  Assessment/Plan 1. Acute recurrent maxillary sinusitis (Primary) Ongoing symptoms for one week.reports sinus pressure and pain across the forehead but worst on maxillary area associated with nasal congestion.  - continue on Flonase  - start on Doxycyline as below  - increase fluid intake  - loratadine 10 mg tablet daily   - doxycycline (VIBRA-TABS) 100 MG tablet; Take 1 tablet (100 mg total) by mouth 2 (two) times daily for 7 days.  Dispense: 14 tablet; Refill: 0  2. Acute cough Non-productive suspect due to PND  - Robitussin DM as every 8 hrs needed  - notify provider if symptoms worsen or fail to improve  - doxycycline (VIBRA-TABS) 100 MG tablet; Take 1 tablet (100 mg total) by mouth 2 (two) times daily for 7 days.  Dispense: 14 tablet; Refill: 0 - guaiFENesin-dextromethorphan (ROBITUSSIN DM) 100-10 MG/5ML syrup; Take 5 mLs by mouth every 8 (eight) hours as needed for cough.  Dispense: 118 mL; Refill: 0  3. Seasonal allergic rhinitis due to pollen Start on loratadine and continue Flonase  - loratadine (CLARITIN) 10 MG tablet; Take 1 tablet (10 mg total) by mouth daily.  Dispense: 30 tablet; Refill: 11  Family/ staff Communication: Reviewed plan of care with patient verbalized understanding   Labs/tests ordered: None   Next Appointment: Return if symptoms worsen or fail to improve.  I connected with  Bennie Hind on 05/20/23 by a video enabled telemedicine application and verified that I am speaking with the correct person using two identifiers.   I discussed the limitations of evaluation and management by telemedicine. The patient expressed understanding and agreed to proceed.   Total time: 20  minutes. Greater than 50% of total time spent doing patient education regarding sinusitis,allergic rhinitis, cough health maintenance including symptom/medication management.  Caesar Bookman, NP

## 2023-05-20 NOTE — Telephone Encounter (Signed)
 2nd attempt Patient called, left VM to return the call to the office to speak to NT.  Patient canceled 1500 appointment today.   Copied from CRM [id number]. Topic: Clinical - Medical Advice >> May 20, 2023  9:51 AM Adrianna P wrote: Reason for CRM: patient is experiencing coughing, sinus drainage, and would like dr to prescribe something for her.

## 2023-05-21 ENCOUNTER — Other Ambulatory Visit: Payer: Self-pay | Admitting: Family

## 2023-05-21 DIAGNOSIS — F331 Major depressive disorder, recurrent, moderate: Secondary | ICD-10-CM

## 2023-05-21 DIAGNOSIS — F411 Generalized anxiety disorder: Secondary | ICD-10-CM

## 2023-05-21 NOTE — Telephone Encounter (Signed)
 Message sent to PCP requesting fax number to forward FMLA forms for mental health.

## 2023-05-22 ENCOUNTER — Telehealth: Payer: Self-pay

## 2023-05-22 NOTE — Telephone Encounter (Signed)
 Left message on voicemail for patient to return call when available, reason for call:  We received a fax from [employer] [facility] [employer], Carilyn Goodpasture, NP states she has already completed FMLA paperwork for patient and it's not usually needed more than once.  Awaiting return call

## 2023-05-22 NOTE — Telephone Encounter (Signed)
 Called pt and advised that [employer] forms have been forwarded to PCP to completed for mental health.

## 2023-05-23 NOTE — Telephone Encounter (Signed)
Called patient and no answer. Voicemail was left with office call back number.   

## 2023-05-26 NOTE — Progress Notes (Signed)
   This encounter was created in error - please disregard. No show

## 2023-05-29 ENCOUNTER — Telehealth: Payer: Self-pay | Admitting: Family

## 2023-05-29 NOTE — Telephone Encounter (Signed)
 FMLA paper work x 3 copies received duplicates.FMLA form completed for dates 05/18/2023 to 05/21/2023.signed forms and given to adm staff to fax as requested.

## 2023-05-29 NOTE — Telephone Encounter (Signed)
 Noted.

## 2023-05-29 NOTE — Telephone Encounter (Signed)
 Noted. Thank You.

## 2023-06-02 ENCOUNTER — Telehealth: Payer: Self-pay

## 2023-06-02 DIAGNOSIS — Z0279 Encounter for issue of other medical certificate: Secondary | ICD-10-CM

## 2023-06-02 NOTE — Telephone Encounter (Signed)
 Brenda [name]  Claim # [id number]

## 2023-06-06 NOTE — Telephone Encounter (Signed)
 Forms faxed successfully to [phone], sent to scan.

## 2023-06-06 NOTE — Telephone Encounter (Signed)
 Form completed for MSK conditions, reviewed and signed by Dr. Alease Hunter, placed at the front desk for faxing scanning.

## 2023-06-07 ENCOUNTER — Other Ambulatory Visit: Payer: Self-pay | Admitting: Family

## 2023-06-07 DIAGNOSIS — I10 Essential (primary) hypertension: Secondary | ICD-10-CM

## 2023-06-07 DIAGNOSIS — E7800 Pure hypercholesterolemia, unspecified: Secondary | ICD-10-CM

## 2023-06-07 DIAGNOSIS — E1165 Type 2 diabetes mellitus with hyperglycemia: Secondary | ICD-10-CM

## 2023-06-17 ENCOUNTER — Ambulatory Visit (INDEPENDENT_AMBULATORY_CARE_PROVIDER_SITE_OTHER)

## 2023-06-17 ENCOUNTER — Ambulatory Visit: Admitting: Family Medicine

## 2023-06-17 VITALS — BP 126/76 | HR 106 | Ht 62.0 in | Wt 136.0 lb

## 2023-06-17 DIAGNOSIS — Z794 Long term (current) use of insulin: Secondary | ICD-10-CM

## 2023-06-17 DIAGNOSIS — M5412 Radiculopathy, cervical region: Secondary | ICD-10-CM

## 2023-06-17 DIAGNOSIS — E1165 Type 2 diabetes mellitus with hyperglycemia: Secondary | ICD-10-CM | POA: Diagnosis not present

## 2023-06-17 MED ORDER — GABAPENTIN 100 MG PO CAPS: 100.0000 mg | ORAL_CAPSULE | Freq: Three times a day (TID) | ORAL | 3 refills | Status: AC | PRN

## 2023-06-17 MED ORDER — PREDNISONE 50 MG PO TABS: ORAL_TABLET | ORAL | 0 refills | Status: DC

## 2023-06-17 NOTE — Progress Notes (Unsigned)
   Joanna Muck, PhD, LAT, ATC acting as a scribe for Garlan Juniper, MD.  Brenda [name] is a 58 y.o. female who presents to [facility] at [facility] today for pressure in her L shoulder pressure and paresthesia. Pt was previously seen by Dr. Alease Hunter on 03/31/23 for R shoulder pain.   Today pt c/o L shoulder pressure and paresthesia x about 1 month. Pt locates area to the L side of her neck, "pressure" through L trapz/superior L shoulder, w/ radiating pain throughout L arm and L hand/fingers.   Dx imaging: 09/28/21 C-spine XR  Pertinent review of systems: No fevers or chills  Relevant historical information: Hypertension and diabetes   Exam:  BP 126/76   Pulse (!) 106   Ht 5\' 2"  (1.575 m)   Wt 136 lb (61.7 kg)   LMP 11/26/2013   SpO2 98%   BMI 24.87 kg/m  General: Well Developed, well nourished, and in no acute distress.   MSK: C-spine: Normal appearing Normal cervical motion. Strength decreased mildly left elbow extension rated 4+/5.  Otherwise extremity strength is equal and normal bilaterally. Reflexes are intact. Sensation is intact. Mildly positive Spurling's test.   Lab and Radiology Results  X-ray images cervical spine obtained today personally and independently interpreted. 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 female with left cervical radiculopathy most likely C7 dermatomal pattern based on symptoms.  She is a little hard to pin down on distribution of pain.  She does have some mild weakness at elbow extension which would correspond to C7.  Plan for course of prednisone  and gabapentin. Recheck back in about a month.  If not improving consider MRI.  Home exercise program reviewed.  We did talk about diabetes and her blood sugar.  Her A1c is 11 but she now has a Dexcom meter and her medications have changed with fasting blood sugars typically in the lower 100s.  She very  rarely has any sugars into the 200s.  We talked about how to adjust insulin if needed for hyperglycemia while on prednisone .  PDMP not reviewed this encounter. Orders Placed This Encounter  Procedures   DG Cervical Spine 2 or 3 views    Standing Status:   Future    Number of Occurrences:   1    Expiration Date:   06/16/2024    Reason for Exam (SYMPTOM  OR DIAGNOSIS REQUIRED):   cervical radiculopathy    Is patient pregnant?:   No    Preferred imaging location?:   [facility] ordered this encounter  Medications   gabapentin (NEURONTIN) 100 MG capsule    Sig: Take 1-3 capsules (100-300 mg total) by mouth 3 (three) times daily as needed.    Dispense:  30 capsule    Refill:  3   predniSONE  (DELTASONE ) 50 MG tablet    Sig: Take 1 pill daily for 5 days    Dispense:  5 tablet    Refill:  0     Discussed warning signs or symptoms. Please see discharge instructions. Patient expresses understanding.   The above documentation has been reviewed and is accurate and complete Garlan Juniper, M.D.

## 2023-06-17 NOTE — Patient Instructions (Addendum)
 Thank you for coming in today.   I've sent a prescription for Prednisone  & Gabapentin to your pharmacy.   Please get an Xray today before you leave   OK to increase insulin to 14 unit twice daily  Check back in 1 month

## 2023-06-19 NOTE — Progress Notes (Signed)
Cervical spine x-ray shows a little bit of arthritis.

## 2023-07-21 ENCOUNTER — Ambulatory Visit: Admitting: Family Medicine

## 2023-07-21 NOTE — Progress Notes (Deleted)
   Joanna Muck, PhD, LAT, ATC acting as a scribe for Garlan Juniper, MD.  Brenda [name] is a 58 y.o. female who presents to [facility] at [facility] today for 56-month f/u cervical radiculopathy. Pt was last seen by Dr. Alease Hunter on 06/17/23 and was prescribed prednisone  and gabapentin .  Today, pt reports ***  Dx imaging: 06/17/23 C-spine XR 09/28/21 C-spine XR   Pertinent review of systems: ***  Relevant historical information: ***   Exam:  LMP 11/26/2013  General: Well Developed, well nourished, and in no acute distress.   MSK: ***    Lab and Radiology Results No results found for this or any previous visit (from the past 72 hours). No results found.     Assessment and Plan: 58 y.o. female with ***   PDMP not reviewed this encounter. No orders of the defined types were placed in this encounter.  No orders of the defined types were placed in this encounter.    Discussed warning signs or symptoms. Please see discharge instructions. Patient expresses understanding.   ***

## 2023-09-06 ENCOUNTER — Other Ambulatory Visit: Payer: Self-pay | Admitting: Family

## 2023-09-06 DIAGNOSIS — F331 Major depressive disorder, recurrent, moderate: Secondary | ICD-10-CM

## 2023-09-06 DIAGNOSIS — F411 Generalized anxiety disorder: Secondary | ICD-10-CM

## 2023-09-08 ENCOUNTER — Ambulatory Visit: Payer: BC Managed Care – PPO | Admitting: Family

## 2023-09-08 NOTE — Telephone Encounter (Signed)
 Anxiety medication.

## 2023-09-16 ENCOUNTER — Telehealth: Payer: Self-pay | Admitting: Family

## 2023-09-16 NOTE — Telephone Encounter (Signed)
 FMLA form was received via fax and has been placed in the provider's S-drive folder for review.

## 2023-09-17 NOTE — Telephone Encounter (Signed)
 Form has been filled out and placed in providers Ngetich, Dinah C, NP review and sign folder.

## 2023-09-17 NOTE — Telephone Encounter (Signed)
 Please print FMLA paperwork and place on provider's desk.

## 2023-09-18 ENCOUNTER — Other Ambulatory Visit: Payer: Self-pay

## 2023-09-18 ENCOUNTER — Other Ambulatory Visit: Payer: Self-pay | Admitting: Family

## 2023-09-18 DIAGNOSIS — M5432 Sciatica, left side: Secondary | ICD-10-CM

## 2023-09-19 ENCOUNTER — Telehealth: Payer: Self-pay | Admitting: Family

## 2023-09-19 NOTE — Telephone Encounter (Signed)
 FMLA paper work completed.Placed on outgoing fax box to be fax then call patient to pickup original copy.

## 2023-09-22 ENCOUNTER — Other Ambulatory Visit: Payer: Self-pay | Admitting: *Deleted

## 2023-09-22 DIAGNOSIS — M5432 Sciatica, left side: Secondary | ICD-10-CM

## 2023-09-22 MED ORDER — IBUPROFEN 800 MG PO TABS: 800.0000 mg | ORAL_TABLET | Freq: Three times a day (TID) | ORAL | 0 refills | Status: DC | PRN

## 2023-09-22 NOTE — Telephone Encounter (Signed)
 Noted

## 2023-09-22 NOTE — Telephone Encounter (Signed)
 Patient called regarding refill on her Ibuprofen .  Was sent for a refill on 09/18/2023 but Transmission to pharmacy Failed.   Pended Rx and sent to [facility] for approval.

## 2023-09-22 NOTE — Telephone Encounter (Signed)
 Copied from CRM [id number]. Topic: Clinical - Prescription Issue >> Sep 22, 2023 11:54 AM Zane F wrote: Reason for CRM:   Patient was looking to confirm if her prescription for the prescription below was sent into the pharmacy; Specialist contacted [facility] and was told that the nurse will have the provider refax the order for the prescription, patient was informed.   Prescription: ibuprofen  (ADVIL ) 800 MG tablet  Preferred Pharmacy: [id number] [place], [place] - [address], [place] [address] Phone: [phone]  Fax: [phone] DEA #: [id number]    Callback Number: [phone]

## 2023-09-29 ENCOUNTER — Encounter: Admitting: Family

## 2023-10-03 DIAGNOSIS — Z0279 Encounter for issue of other medical certificate: Secondary | ICD-10-CM

## 2023-10-07 ENCOUNTER — Encounter: Admitting: Family

## 2023-10-10 ENCOUNTER — Ambulatory Visit: Admitting: Family Medicine

## 2023-10-10 ENCOUNTER — Ambulatory Visit: Admitting: Sports Medicine

## 2023-10-15 ENCOUNTER — Encounter: Admitting: Family

## 2023-10-16 NOTE — Telephone Encounter (Signed)
 Additional 37 pages of FMLA has been faxed to our office. Papers placed in provider review and sign folder. Message routed to PCP Ngetich, [name], NP

## 2023-10-19 NOTE — Progress Notes (Signed)
   This encounter was created in error - please disregard. No show

## 2023-10-30 NOTE — Telephone Encounter (Signed)
 Routed to admin for clarification.

## 2023-10-30 NOTE — Telephone Encounter (Signed)
 Please verify what other additional information is required.

## 2023-10-31 ENCOUNTER — Telehealth: Payer: Self-pay

## 2023-10-31 NOTE — Telephone Encounter (Signed)
 FMLA form received  Due 11/17/23

## 2023-11-04 NOTE — Telephone Encounter (Signed)
 Noted

## 2023-11-04 NOTE — Telephone Encounter (Signed)
 [name], Brenda to Brenda [name]  [facility] Clinical (Selected Message) AM    11/04/23 10:04 AM Sent [name] fax to [employer]. Please clarify what additional information or documentation is required.

## 2023-11-04 NOTE — Telephone Encounter (Signed)
 Routed back to Alondra.M

## 2023-11-07 NOTE — Telephone Encounter (Signed)
 Called and spoke with patient, scheduled f/u visit for 11/17/23. Pt is aware that forms have been received and will be completed after re-eval at f/u visit.

## 2023-11-13 NOTE — Telephone Encounter (Signed)
 Patient called asking if Le[employer]is could call her as soon as possible in regards to something that her work is needed for coverage on missed days from work. She can be reached at [phone].

## 2023-11-13 NOTE — Telephone Encounter (Signed)
Called pt, left VM to call the office.  

## 2023-11-14 NOTE — Telephone Encounter (Signed)
 Called and spoke with patient, has concerns, received denial letter from [employer]. Has been written up at work.   I let pt know that form that we received stated that forms had to be returned by 11/17/23. We will complete her forms during visit 11/17/23 and send back the same day. Pt verbalized understanding.

## 2023-11-17 ENCOUNTER — Ambulatory Visit: Admitting: Family Medicine

## 2023-11-17 NOTE — Telephone Encounter (Signed)
 Forms faxed successfully to [phone], sent to scan.

## 2023-11-17 NOTE — Telephone Encounter (Signed)
 FMLA renewal form due 11/17/23. Pt r/s f/u visit from 11/17/23 to 11/21/23. Called [employer] to request extension, placed on hold for 20 minutes.   Form completed based on last OV 06/17/23. Can addend, if needed, after scheduled visit 11/21/23.   Form placed on Dr. Virgilio desk to review and advise.

## 2023-11-17 NOTE — Telephone Encounter (Signed)
 Form due today. Completed, reviewed and signed by Dr. Joane, placed at the front desk for faxing/scanning.

## 2023-11-18 NOTE — Telephone Encounter (Signed)
 Please see encounter from 10/31/2023. Patient had FMLA paper work filled out by [facility] [employer] yesterday. Message routed to PCP Ngetich, [name], NP as Shelli.W/CMA.

## 2023-11-19 ENCOUNTER — Telehealth (INDEPENDENT_AMBULATORY_CARE_PROVIDER_SITE_OTHER): Admitting: Family

## 2023-11-19 DIAGNOSIS — I10 Essential (primary) hypertension: Secondary | ICD-10-CM

## 2023-11-19 DIAGNOSIS — E1165 Type 2 diabetes mellitus with hyperglycemia: Secondary | ICD-10-CM

## 2023-11-19 DIAGNOSIS — K219 Gastro-esophageal reflux disease without esophagitis: Secondary | ICD-10-CM | POA: Diagnosis not present

## 2023-11-19 DIAGNOSIS — F411 Generalized anxiety disorder: Secondary | ICD-10-CM

## 2023-11-19 DIAGNOSIS — M5432 Sciatica, left side: Secondary | ICD-10-CM

## 2023-11-19 DIAGNOSIS — F331 Major depressive disorder, recurrent, moderate: Secondary | ICD-10-CM

## 2023-11-19 DIAGNOSIS — Z794 Long term (current) use of insulin: Secondary | ICD-10-CM

## 2023-11-19 DIAGNOSIS — E7800 Pure hypercholesterolemia, unspecified: Secondary | ICD-10-CM

## 2023-11-19 MED ORDER — PANTOPRAZOLE SODIUM 20 MG PO TBEC: 20.0000 mg | DELAYED_RELEASE_TABLET | Freq: Every day | ORAL | 1 refills | Status: DC

## 2023-11-19 MED ORDER — AMLODIPINE BESYLATE 5 MG PO TABS: 5.0000 mg | ORAL_TABLET | Freq: Every day | ORAL | 1 refills | Status: DC

## 2023-11-19 MED ORDER — ATORVASTATIN CALCIUM 40 MG PO TABS: 40.0000 mg | ORAL_TABLET | Freq: Every day | ORAL | 1 refills | Status: AC

## 2023-11-19 MED ORDER — LISINOPRIL 5 MG PO TABS: 5.0000 mg | ORAL_TABLET | Freq: Every day | ORAL | 1 refills | Status: AC

## 2023-11-19 MED ORDER — IBUPROFEN 800 MG PO TABS
800.0000 mg | ORAL_TABLET | Freq: Three times a day (TID) | ORAL | 1 refills | Status: AC | PRN
Start: 2023-11-19 — End: ?

## 2023-11-19 MED ORDER — VENLAFAXINE HCL ER 150 MG PO CP24: 150.0000 mg | ORAL_CAPSULE | Freq: Every day | ORAL | 1 refills | Status: AC

## 2023-11-19 NOTE — Progress Notes (Signed)
 This service is provided via telemedicine  No vital signs collected/recorded due to the encounter was a telemedicine visit.   Location of patient (ex: home, work):  Home  Patient consents to a telephone visit: Yes  Location of the provider (ex: office, home):  [facility], Office   Name of any referring provider:  N/A  Names of all persons participating in the telemedicine service and their role in the encounter:  Brenda [name], CMA, Patient, and Brenda [name], Brenda BROCKS, NP   Time spent on call:  9 min with medical assistant      Provider: Tomio Kirk FNP-C  Delphin Funes, Brenda BROCKS, NP  Patient Care Team: Brenda [name], Brenda BROCKS, NP as PCP - General (Family Medicine)  Extended Emergency Contact Information Primary Emergency Contact: Brenda [name] Address: [address]          [place], [place] [address] [place] Mobile Phone: [phone] Relation: Mother Secondary Emergency Contact: Brenda [name] Mobile Phone: [phone] Relation: Brother  Code Status:  Full Code  Goals of care: Advanced Directive information    11/19/2023    3:19 PM  Advanced Directives  Does Patient Have a Medical Advance Directive? No  Would patient like information on creating a medical advance directive? No - Patient declined     Discussed the use of AI scribe software for clinical note transcription with the patient, who gave verbal consent to proceed.  History of Present Illness   Brenda [name] is a 58 year old female who presents for FMLA paperwork related to depression.  She is experiencing episodes of depression three to four times per week, each lasting at least eight hours. These episodes have been ongoing since August 2024. She is seeing a therapist once a month and is taking Effexor  150 mg daily. She needs refills for this medication.  She has been off work for several days in the past month due to her depression, necessitating the completion of FMLA  paperwork.  She is managing her diabetes with insulin, specifically using Novolin  a quick pen 70/30 at 12 units twice daily. Her blood sugars have been as high as 300. She is also on Ozempic  whenever she can afford it.  Her current medications include Effexor , Protonix , amlodipine , lisinopril , atorvastatin , and ibuprofen .    Past Medical History:  Diagnosis Date   Allergic rhinitis, cause unspecified    Anemia, unspecified    Diabetes mellitus without complication (HCC)    Esophageal reflux    Generalized anxiety disorder    Headache(784.0)    Hypertension    Pure hypercholesterolemia    Rash and other nonspecific skin eruption    Unspecified vitamin D  deficiency    Past Surgical History:  Procedure Laterality Date   TENDON REPAIR  1990   left index finger    Allergies  Allergen Reactions   Codeine Phosphate     REACTION: vomiting    Outpatient Encounter Medications as of 11/19/2023  Medication Sig   blood glucose meter kit and supplies KIT Dispense based on patient and insurance preference.  Use before breakfast and at bedtime. E11.4   estradiol  (CLIMARA  - DOSED IN MG/24 HR) 0.025 mg/24hr patch PLACE 1 PATCH ONTO THE SKIN ONCE A WEEK. NEEDS TO SCHEDULE MAMMOGRAM FOR ADDITIONAL REFILLS   fluticasone  (FLONASE ) 50 MCG/ACT nasal spray Place 2 sprays into both nostrils daily. (Patient taking differently: Place 2 sprays into both nostrils as needed.)   gabapentin  (NEURONTIN ) 100 MG capsule Take 1-3 capsules (100-300 mg  total) by mouth 3 (three) times daily as needed.   insulin isophane & regular human KwikPen (NOVOLIN  70/30 KWIKPEN) (70-30) 100 UNIT/ML KwikPen Inject 12 Units into the skin 2 (two) times daily with a meal.   Insulin Pen Needle (BD PEN NEEDLE NANO 2ND GEN) 32G X 4 MM MISC USE WITH INSULIN PEN AS DIRECTED   loratadine  (CLARITIN ) 10 MG tablet Take 1 tablet (10 mg total) by mouth daily. (Patient taking differently: Take 10 mg by mouth as needed.)   ondansetron   (ZOFRAN ) 4 MG tablet Take 1 tablet (4 mg total) by mouth every 6 (six) hours.   ONETOUCH VERIO test strip USE BEFORE BREAKFAST AND AT BEDTIME.   predniSONE  (DELTASONE ) 50 MG tablet Take 1 pill daily for 5 days (Patient taking differently: as needed. Take 1 pill daily for 5 days)   Semaglutide ,0.25 or 0.5MG /DOS, (OZEMPIC , 0.25 OR 0.5 MG/DOSE,) 2 MG/3ML SOPN Inject 0.5 mg into the skin once a week.   tiZANidine  (ZANAFLEX ) 4 MG tablet Take 0.5-1 tablets (2-4 mg total) by mouth every 8 (eight) hours as needed for muscle spasms.   [DISCONTINUED] amLODipine  (NORVASC ) 5 MG tablet Take 1 tablet (5 mg total) by mouth daily.   [DISCONTINUED] atorvastatin  (LIPITOR) 40 MG tablet Take 1 tablet (40 mg total) by mouth daily.   [DISCONTINUED] ibuprofen  (ADVIL ) 800 MG tablet Take 1 tablet (800 mg total) by mouth every 8 (eight) hours as needed.   [DISCONTINUED] lisinopril  (ZESTRIL ) 5 MG tablet Take 1 tablet (5 mg total) by mouth daily.   [DISCONTINUED] pantoprazole  (PROTONIX ) 20 MG tablet Take 1 tablet (20 mg total) by mouth daily.   [DISCONTINUED] venlafaxine  XR (EFFEXOR -XR) 150 MG 24 hr capsule TAKE 1 CAPSULE BY MOUTH EVERY DAY WITH BREAKFAST   amLODipine  (NORVASC ) 5 MG tablet Take 1 tablet (5 mg total) by mouth daily.   atorvastatin  (LIPITOR) 40 MG tablet Take 1 tablet (40 mg total) by mouth daily.   guaiFENesin -dextromethorphan (ROBITUSSIN DM) 100-10 MG/5ML syrup Take 5 mLs by mouth every 8 (eight) hours as needed for cough. (Patient not taking: Reported on 11/19/2023)   ibuprofen  (ADVIL ) 800 MG tablet Take 1 tablet (800 mg total) by mouth every 8 (eight) hours as needed.   lisinopril  (ZESTRIL ) 5 MG tablet Take 1 tablet (5 mg total) by mouth daily.   pantoprazole  (PROTONIX ) 20 MG tablet Take 1 tablet (20 mg total) by mouth daily.   venlafaxine  XR (EFFEXOR -XR) 150 MG 24 hr capsule Take 1 capsule (150 mg total) by mouth daily.   [DISCONTINUED] omeprazole  (PRILOSEC  OTC) 20 MG tablet Take 1 tablet (20 mg total) by  mouth daily.   No facility-administered encounter medications on file as of 11/19/2023.    Review of Systems  Constitutional:  Negative for appetite change, chills, fatigue and fever.  Respiratory:  Negative for cough, chest tightness, shortness of breath and wheezing.   Cardiovascular:  Negative for chest pain, palpitations and leg swelling.  Gastrointestinal:  Negative for abdominal distention, abdominal pain, diarrhea, nausea and vomiting.  Endocrine: Negative for cold intolerance, heat intolerance, polydipsia, polyphagia and polyuria.  Genitourinary:  Negative for difficulty urinating, dysuria, frequency and urgency.  Musculoskeletal:  Positive for arthralgias and back pain. Negative for gait problem, joint swelling, myalgias, neck pain and neck stiffness.  Neurological:  Negative for dizziness, syncope, speech difficulty, weakness, light-headedness, numbness and headaches.  Psychiatric/Behavioral:  Negative for agitation, behavioral problems, confusion, hallucinations, self-injury, sleep disturbance and suicidal ideas. The patient is not nervous/anxious.  Ongoing depression follows up with Therapist once a month     Immunization History  Administered Date(s) Administered   Fluad Quad(high Dose 65+) 11/22/2020, 11/20/2021   Fluad Trivalent(High Dose 65+) 03/11/2023   Influenza Split 02/15/2011   Influenza Whole 11/09/2007, 12/01/2008   Influenza,inj,Quad PF,6+ Mos 10/21/2013, 03/22/2015, 10/17/2015, 03/12/2019   PFIZER(Purple Top)SARS-COV-2 Vaccination 06/03/2019, 09/07/2019   PNEUMOCOCCAL CONJUGATE-20 01/15/2021   Pneumococcal Polysaccharide-23 12/30/2016   Tdap 03/22/2015, 09/13/2017   Pertinent  Health Maintenance Due  Topic Date Due   OPHTHALMOLOGY EXAM  Never done   Mammogram  Never done   Colonoscopy  04/02/2010   FOOT EXAM  11/22/2021   HEMOGLOBIN A1C  09/18/2023   Influenza Vaccine  12/24/2023 (Originally 09/12/2023)      09/16/2022    2:42 PM 03/11/2023    3:38  PM 03/28/2023    3:40 PM 05/20/2023    3:07 PM 11/19/2023    3:19 PM  Fall Risk  Falls in the past year? 0 0 0 0 0  Was there an injury with Fall? 0 0 0 0 0  Fall Risk Category Calculator 0 0 0 0 0  Patient at Risk for Falls Due to No Fall Risks  No Fall Risks No Fall Risks No Fall Risks  Fall risk Follow up Falls evaluation completed  Falls evaluation completed Falls evaluation completed Falls evaluation completed   Functional Status Survey:    There were no vitals filed for this visit. There is no height or weight on file to calculate BMI. Physical Exam  GENERAL: Alert, cooperative, well developed, no acute distress HEENT: Normocephalic CHEST: No respiratory distress NEUROLOGICAL: Moves all extremities without gross motor or sensory deficit PSYCHIATRY/BHAVIORAL: Mood depressed   Labs reviewed: Recent Labs    03/21/23 0845  NA 133*  K 4.2  CL 99  CO2 25  GLUCOSE 297*  BUN 13  CREATININE 0.66  CALCIUM  9.7   Recent Labs    03/21/23 0845  AST 13  ALT 16  BILITOT 0.3  PROT 7.2   Recent Labs    03/21/23 0845  WBC 7.8  NEUTROABS 4,376  HGB 12.4  HCT 39.8  MCV 82.1  PLT 431*   Lab Results  Component Value Date   TSH 0.91 03/21/2023   Lab Results  Component Value Date   HGBA1C 11.9 (H) 03/21/2023   Lab Results  Component Value Date   CHOL 169 03/21/2023   HDL 59 03/21/2023   LDLCALC 92 03/21/2023   LDLDIRECT 99.0 11/22/2020   TRIG 86 03/21/2023   CHOLHDL 2.9 03/21/2023    Significant Diagnostic Results in last 30 days:  No results found.  Assessment/Plan     Type 2 diabetes mellitus with hyperglycemia Type 2 diabetes with episodes of hyperglycemia. Currently on a 70/30 insulin regimen with 12 units twice daily. Blood sugars have been as high as 300 mg/dL, indicating the need for potential adjustment in insulin therapy. Currently using Ozempic . Discussion about switching to Lantus, which would require additional mealtime insulin. - Book appointment  for blood work to check A1c - Adjust insulin regimen based on blood work results - Continue current medications: Effexor , Protonix , Lodosyn, lisinopril , atorvastatin , ibuprofen  - Send prescriptions to [facility] on [address]  Depression Chronic depression managed with Effexor . Currently seeing a therapist once a month. No psychiatrist involvement. Symptoms started in August 2024 and are expected to last until August 2026. Episodes occur three to four times per week, lasting at least eight hours. - Refill  Effexor  150 mg daily - Complete FMLA paperwork for depression-related absences   Family/ staff Communication: Reviewed plan of care with patient verbalized understanding   Labs/tests ordered: None   Next Appointment: Return in about 1 week (around 11/26/2023) for medical mangement of chronic issues..  I connected with  Brenda [name] on 11/19/23 by a video enabled telemedicine application and verified that I am speaking with the correct person using two identifiers.   I discussed the limitations of evaluation and management by telemedicine. The patient expressed understanding and agreed to proceed.   Total time: 20 minutes. Greater than 50% of total time spent doing patient education regarding T2DM,Depression,chronic back pain,health maintenance including symptom/medication management.   Brenda JAYSON Plough, NP

## 2023-11-20 NOTE — Progress Notes (Deleted)
   LILLETTE Ileana Collet, PhD, LAT, ATC acting as a scribe for Artist Lloyd, MD.  Brenda [name] is a 58 y.o. female who presents to [facility] today for 1-month f/u cervical radiculopathy. Pt was last seen by Dr. Lloyd on 06/17/23 and was prescribed prednisone  and gabapentin .  Today, pt reports ***  Dx imaging: 06/17/23 C-spine XR 09/28/21 C-spine XR   Pertinent review of systems: ***  Relevant historical information: ***   Exam:  LMP 11/26/2013  General: Well Developed, well nourished, and in no acute distress.   MSK: ***    Lab and Radiology Results No results found for this or any previous visit (from the past 72 hours). No results found.     Assessment and Plan: 58 y.o. female with ***   PDMP not reviewed this encounter. No orders of the defined types were placed in this encounter.  No orders of the defined types were placed in this encounter.    Discussed warning signs or symptoms. Please see discharge instructions. Patient expresses understanding.   ***

## 2023-11-21 ENCOUNTER — Ambulatory Visit: Admitting: Family Medicine

## 2023-11-26 ENCOUNTER — Encounter: Payer: Self-pay | Admitting: Family Medicine

## 2023-11-26 ENCOUNTER — Ambulatory Visit: Admitting: Family

## 2023-12-05 ENCOUNTER — Ambulatory Visit: Admitting: Family

## 2023-12-15 ENCOUNTER — Encounter: Payer: Self-pay | Admitting: Family

## 2023-12-22 ENCOUNTER — Ambulatory Visit: Admitting: Family

## 2023-12-22 NOTE — Progress Notes (Signed)
   This encounter was created in error - please disregard. No show

## 2023-12-30 ENCOUNTER — Ambulatory Visit: Admitting: Family

## 2024-01-19 ENCOUNTER — Ambulatory Visit: Admitting: Family

## 2024-02-03 ENCOUNTER — Encounter: Payer: Self-pay | Admitting: Family Medicine

## 2024-02-03 ENCOUNTER — Ambulatory Visit: Admitting: Family Medicine

## 2024-02-03 ENCOUNTER — Ambulatory Visit: Admitting: Family

## 2024-02-17 DIAGNOSIS — E1165 Type 2 diabetes mellitus with hyperglycemia: Secondary | ICD-10-CM

## 2024-02-20 ENCOUNTER — Encounter: Admitting: Family

## 2024-02-25 ENCOUNTER — Ambulatory Visit: Admitting: Family

## 2024-02-25 ENCOUNTER — Encounter: Payer: Self-pay | Admitting: Family

## 2024-02-25 VITALS — BP 136/72 | HR 104 | Temp 97.0°F | Ht 62.0 in | Wt 139.0 lb

## 2024-02-25 DIAGNOSIS — Z23 Encounter for immunization: Secondary | ICD-10-CM

## 2024-02-25 DIAGNOSIS — K2100 Gastro-esophageal reflux disease with esophagitis, without bleeding: Secondary | ICD-10-CM | POA: Diagnosis not present

## 2024-02-25 DIAGNOSIS — E1165 Type 2 diabetes mellitus with hyperglycemia: Secondary | ICD-10-CM

## 2024-02-25 DIAGNOSIS — Z0001 Encounter for general adult medical examination with abnormal findings: Secondary | ICD-10-CM | POA: Diagnosis not present

## 2024-02-25 DIAGNOSIS — K219 Gastro-esophageal reflux disease without esophagitis: Secondary | ICD-10-CM | POA: Diagnosis not present

## 2024-02-25 DIAGNOSIS — E785 Hyperlipidemia, unspecified: Secondary | ICD-10-CM | POA: Diagnosis not present

## 2024-02-25 DIAGNOSIS — M79671 Pain in right foot: Secondary | ICD-10-CM

## 2024-02-25 DIAGNOSIS — Z1211 Encounter for screening for malignant neoplasm of colon: Secondary | ICD-10-CM

## 2024-02-25 DIAGNOSIS — F411 Generalized anxiety disorder: Secondary | ICD-10-CM | POA: Diagnosis not present

## 2024-02-25 DIAGNOSIS — F331 Major depressive disorder, recurrent, moderate: Secondary | ICD-10-CM | POA: Diagnosis not present

## 2024-02-25 DIAGNOSIS — I10 Essential (primary) hypertension: Secondary | ICD-10-CM | POA: Diagnosis not present

## 2024-02-25 DIAGNOSIS — Z1231 Encounter for screening mammogram for malignant neoplasm of breast: Secondary | ICD-10-CM | POA: Diagnosis not present

## 2024-02-25 DIAGNOSIS — Z794 Long term (current) use of insulin: Secondary | ICD-10-CM

## 2024-02-25 LAB — MICROALBUMIN / CREATININE URINE RATIO
Creatinine, Urine: 72 mg/dL (ref 20–275)
Microalb Creat Ratio: 14 mg/g{creat}
Microalb, Ur: 1 mg/dL

## 2024-02-25 MED ORDER — PANTOPRAZOLE SODIUM 20 MG PO TBEC: 20.0000 mg | DELAYED_RELEASE_TABLET | Freq: Every day | ORAL | 1 refills | Status: AC

## 2024-02-25 MED ORDER — AMLODIPINE BESYLATE 10 MG PO TABS: 10.0000 mg | ORAL_TABLET | Freq: Every day | ORAL | 1 refills | Status: AC

## 2024-02-25 NOTE — Progress Notes (Signed)
 "  Provider: Calvert Charland FNP-C   Declan Mier, [name], NP  Patient Care Team: Donnel Venuto, [name], NP as PCP - General (Family Medicine)  Extended Emergency Contact Information Primary Emergency Contact: Gaither,Gladys Address: [address]          [place], [place] [address] [place] Mobile Phone: [phone] Relation: Mother Secondary Emergency Contact: Schiraldi,Forey Mobile Phone: [phone] Relation: Brother  Code Status: Full code Goals of care: Advanced Directive information    11/19/2023    3:19 PM  Advanced Directives  Does Patient Have a Medical Advance Directive? No  Would patient like information on creating a medical advance directive? No - Patient declined     Chief Complaint  Patient presents with   Annual Exam    Annual exam. Discuss the need for colonoscopy, and foot exam due today.     History of Present Illness   Brenda [name] is a 59 year old female with diabetes and hypertension who presents for follow-up annual physical exam.  She has difficulty managing her blood sugar levels, with home readings often around 385 mg/dL and occasionally reaching 400 mg/dL, which she attributes to her diet. She uses Novolin  70/30 insulin, administering units twice daily, and prefers Lantus insulin. She discontinued Ozempic  due to high copay costs. She checks her blood sugar twice daily, typically after meals.  Her blood pressure readings at home are around 140/90 mmHg, with a recent reading of 144/78 mmHg in the right arm and 136/72 mmHg in the left arm. She is on amlodipine  5 mg for hypertension.  She takes atorvastatin  40 mg for cholesterol management and reports no changes in her diet or exercise routine, although she mentions using stairs at work. She is also on lisinopril  5 mg, loratadine  daily, and uses Flonase  as needed for nasal congestion.  She uses an estrogen patch occasionally and takes gabapentin  as needed for right shoulder pain, which she also  manages with Advil  800 mg as needed. She reports persistent right shoulder pain.  She takes Effexor  for depression and anxiety but does not take it consistently. She also uses tizanidine  as needed for muscle relaxation.  She reports occasional nausea for which she uses Zofran , but she is currently out of it. She uses Protonix  for acid reflux and has not taken prednisone  recently. She drinks wine occasionally and trims her own toenails, reporting no numbness or tingling in her feet, but mentions pain in her right foot when walking, described as 'nagging'.    Past Medical History:  Diagnosis Date   Allergic rhinitis, cause unspecified    Anemia, unspecified    Arthritis    Depression    Diabetes mellitus without complication (HCC)    Esophageal reflux    Generalized anxiety disorder    Headache(784.0)    Heart murmur    Hypertension    Pure hypercholesterolemia    Rash and other nonspecific skin eruption    Unspecified vitamin D  deficiency    Past Surgical History:  Procedure Laterality Date   TENDON REPAIR  1990   left index finger    Allergies[1]  Allergies as of 02/25/2024       Reactions   Codeine Phosphate    REACTION: vomiting        Medication List        Accurate as of February 25, 2024  4:06 PM. If you have any questions, ask your nurse or doctor.          STOP taking these medications  guaiFENesin -dextromethorphan 100-10 MG/5ML syrup Commonly known as: ROBITUSSIN DM Stopped by: Wyland Rastetter, NP   Ozempic  (0.25 or 0.5 MG/DOSE) 2 MG/3ML Sopn Generic drug: Semaglutide (0.25 or 0.5MG /DOS) Stopped by: Roxan Plough, NP   predniSONE  50 MG tablet Commonly known as: DELTASONE  Stopped by: Roxan Plough, NP       TAKE these medications    amLODipine  10 MG tablet Commonly known as: NORVASC  Take 1 tablet (10 mg total) by mouth daily. What changed:  medication strength how much to take Changed by: Aikam Vinje, NP   atorvastatin  40 MG  tablet Commonly known as: LIPITOR Take 1 tablet (40 mg total) by mouth daily.   BD Pen Needle Nano 2nd Gen 32G X 4 MM Misc Generic drug: Insulin Pen Needle USE WITH INSULIN PEN AS DIRECTED   blood glucose meter kit and supplies Kit Dispense based on patient and insurance preference.  Use before breakfast and at bedtime. E11.4   estradiol  0.025 mg/24hr patch Commonly known as: CLIMARA  - Dosed in mg/24 hr PLACE 1 PATCH ONTO THE SKIN ONCE A WEEK. NEEDS TO SCHEDULE MAMMOGRAM FOR ADDITIONAL REFILLS   fluticasone  50 MCG/ACT nasal spray Commonly known as: FLONASE  Place 2 sprays into both nostrils daily.   gabapentin  100 MG capsule Commonly known as: NEURONTIN  Take 1-3 capsules (100-300 mg total) by mouth 3 (three) times daily as needed.   ibuprofen  800 MG tablet Commonly known as: ADVIL  Take 1 tablet (800 mg total) by mouth every 8 (eight) hours as needed.   lisinopril  5 MG tablet Commonly known as: ZESTRIL  Take 1 tablet (5 mg total) by mouth daily.   loratadine  10 MG tablet Commonly known as: CLARITIN  Take 1 tablet (10 mg total) by mouth daily.   NovoLIN  70/30 Kwikpen (70-30) 100 UNIT/ML KwikPen Generic drug: insulin isophane & regular human KwikPen INJECT 12 UNITS INTO THE SKIN 2 (TWO) TIMES DAILY WITH A MEAL.   ondansetron  4 MG tablet Commonly known as: ZOFRAN  Take 1 tablet (4 mg total) by mouth every 6 (six) hours.   OneTouch Verio test strip Generic drug: glucose blood USE BEFORE BREAKFAST AND AT BEDTIME.   pantoprazole  20 MG tablet Commonly known as: PROTONIX  Take 1 tablet (20 mg total) by mouth daily.   tiZANidine  4 MG tablet Commonly known as: ZANAFLEX  Take 0.5-1 tablets (2-4 mg total) by mouth every 8 (eight) hours as needed for muscle spasms.   venlafaxine  XR 150 MG 24 hr capsule Commonly known as: EFFEXOR -XR Take 1 capsule (150 mg total) by mouth daily.        Review of Systems  Constitutional:  Negative for appetite change, chills, fatigue, fever  and unexpected weight change.  HENT:  Negative for congestion, dental problem, ear discharge, ear pain, hearing loss, nosebleeds, postnasal drip, rhinorrhea, sinus pressure, sinus pain, sneezing, sore throat, tinnitus and trouble swallowing.   Eyes:  Negative for pain, discharge, redness, itching and visual disturbance.  Respiratory:  Negative for cough, chest tightness, shortness of breath and wheezing.   Cardiovascular:  Negative for chest pain, palpitations and leg swelling.  Gastrointestinal:  Negative for abdominal distention, abdominal pain, blood in stool, constipation, diarrhea, nausea and vomiting.  Endocrine: Negative for cold intolerance, heat intolerance, polydipsia, polyphagia and polyuria.  Genitourinary:  Negative for difficulty urinating, dysuria, flank pain, frequency and urgency.  Musculoskeletal:  Positive for arthralgias. Negative for back pain, gait problem, joint swelling, myalgias, neck pain and neck stiffness.       Right shoulder pain Right foot pain  Skin:  Negative for color change, pallor, rash and wound.  Neurological:  Negative for dizziness, syncope, speech difficulty, weakness, light-headedness, numbness and headaches.  Hematological:  Does not bruise/bleed easily.  Psychiatric/Behavioral:  Negative for agitation, behavioral problems, confusion, hallucinations, self-injury, sleep disturbance and suicidal ideas. The patient is nervous/anxious.     Immunization History  Administered Date(s) Administered   Fluad Quad(high Dose 65+) 11/22/2020, 11/20/2021   Fluad Trivalent(High Dose 65+) 03/11/2023   Influenza Split 02/15/2011   Influenza Whole 11/09/2007, 12/01/2008   Influenza, Seasonal, Injecte, Preservative Fre 02/25/2024   Influenza,inj,Quad PF,6+ Mos 10/21/2013, 03/22/2015, 10/17/2015, 03/12/2019   PFIZER(Purple Top)SARS-COV-2 Vaccination 06/03/2019, 09/07/2019   PNEUMOCOCCAL CONJUGATE-20 01/15/2021   Pneumococcal Polysaccharide-23 12/30/2016   Tdap  03/22/2015, 09/13/2017   Pertinent  Health Maintenance Due  Topic Date Due   OPHTHALMOLOGY EXAM  Never done   Mammogram  Never done   Colonoscopy  04/02/2010   HEMOGLOBIN A1C  09/18/2023   FOOT EXAM  02/24/2025   Influenza Vaccine  Completed      03/11/2023    3:38 PM 03/28/2023    3:40 PM 05/20/2023    3:07 PM 11/19/2023    3:19 PM 02/25/2024    3:18 PM  Fall Risk  Falls in the past year? 0 0 0 0 0  Was there an injury with Fall? 0  0  0  0  0  Fall Risk Category Calculator 0 0 0 0 0  Patient at Risk for Falls Due to  No Fall Risks No Fall Risks No Fall Risks No Fall Risks  Fall risk Follow up  Falls evaluation completed Falls evaluation completed Falls evaluation completed Falls evaluation completed     Data saved with a previous flowsheet row definition   Functional Status Survey:    Vitals:   02/25/24 1523 02/25/24 1526  BP: (!) 144/78 136/72  Pulse: (!) 104   Temp: (!) 97 F (36.1 C)   SpO2: 98%   Weight: 139 lb (63 kg)   Height: 5' 2 (1.575 m)    Body mass index is 25.42 kg/m. Physical Exam  VITALS: BP- 144/78 GENERAL: Alert, cooperative, well developed, no acute distress HEENT: Normocephalic, normal oropharynx, moist mucous membranes, ears normal, right nasal congestion, no sinus tenderness NECK: No lymphadenopathy CHEST: Clear to auscultation bilaterally, no wheezes, rhonchi, or crackles CARDIOVASCULAR: Normal heart rate and rhythm, S1 and S2 normal without murmurs ABDOMEN: Soft, non-tender, non-distended, without organomegaly, normal bowel sounds, liver normal EXTREMITIES: No cyanosis or edema, circulation intact MUSCULOSKELETAL: Right shoulder pain on movement NEUROLOGICAL: Cranial nerves grossly intact, moves all extremities without gross motor or sensory deficit, sensation intact in feet, reflexes normal  SKIN: No rash,no lesion or erythema   PSYCHIATRY/BEHAVIORAL: Mood stable   Labs reviewed: Recent Labs    03/21/23 0845  NA 133*  K 4.2  CL 99   CO2 25  GLUCOSE 297*  BUN 13  CREATININE 0.66  CALCIUM  9.7   Recent Labs    03/21/23 0845  AST 13  ALT 16  BILITOT 0.3  PROT 7.2   Recent Labs    03/21/23 0845  WBC 7.8  NEUTROABS 4,376  HGB 12.4  HCT 39.8  MCV 82.1  PLT 431*   Lab Results  Component Value Date   TSH 0.91 03/21/2023   Lab Results  Component Value Date   HGBA1C 11.9 (H) 03/21/2023   Lab Results  Component Value Date   CHOL 169 03/21/2023   HDL 59 03/21/2023   LDLCALC 92  03/21/2023   LDLDIRECT 99.0 11/22/2020   TRIG 86 03/21/2023   CHOLHDL 2.9 03/21/2023    Significant Diagnostic Results in last 30 days:  No results found.  Assessment/Plan  Type 2 diabetes mellitus with hyperglycemia Blood sugars remain elevated with readings of 380-400 mg/dL, likely due to dietary habits. Current insulin regimen includes Novolin  70/30 units twice daily. She is interested in switching to Lantus but will await A1c results before making changes. - Ordered fasting lab work including A1c, cholesterol, thyroid , chemistry, and anemia screening - Continue current insulin regimen until lab results are reviewed - Encouraged dietary modifications to improve blood sugar control - Scheduled follow-up appointment for lab results review  Essential hypertension Blood pressure readings at home are around 140/80 mmHg, with occasional readings in the 130s. Current medication is amlodipine  5 mg. Target blood pressure is less than 130/80 mmHg due to diabetes. - Increase amlodipine  to 10 mg daily if home readings remain in the 140s - Monitor blood pressure at home and report consistent high readings  Hyperlipidemia Currently managed with atorvastatin  40 mg. No recent dietary changes reported. - Continue atorvastatin  40 mg daily - Encouraged dietary modifications to support cholesterol management  Major depressive disorder and generalized anxiety disorder Currently prescribed Effexor  but not taking it regularly. Emphasized  the importance of daily intake for efficacy. - Encouraged daily intake of Effexor  for mood stabilization and to support exercise adherence  Right foot pain Reports nagging pain in the right foot, possibly due to neuropathy or shoe support issues.  - Referred to podiatrist for further evaluation and management  Gastroesophageal reflux disease Currently managed with acid reflux medication. - Continue acid reflux medication as prescribed  Chronic rhinitis Right nasal congestion and swelling. Currently using Flonase  as needed. - Use Flonase  daily to manage nasal congestion  Right shoulder pain Chronic right shoulder pain with occasional pain on movement. Currently managed with Advil  as needed. - Continue Advil  as needed for pain management - Follow up with orthopedic specialist for further evaluation  Annual Physical examination  Due for several routine health maintenance screenings and vaccinations. Discussed the importance of regular screenings and vaccinations, especially given her age and risk factors. - Referred to eye doctor for diabetic eye screening - Referred to breast center for mammogram - Referred to gastroenterologist for colonoscopy - Advised to obtain shingles and COVID vaccines at pharmacy - Encouraged regular dental visits   Family/ staff Communication: Reviewed plan of care with patient  Labs/tests ordered:  - CBC with Differential/Platelet - CMP with eGFR([facility]) - TSH - Hgb A1C - Lipid panel   Next Appointment : Return in about 6 months (around 08/24/2024) for medical mangement of chronic issues., fasting labs in one week.   Spent 30 minutes of Face to face and non-face to face with patient  >50% time spent counseling; reviewing medical record; tests; labs; documentation and developing future plan of care.   Allie Gerhold C Christin Mccreedy, NP      [1]  Allergies Allergen Reactions   Codeine Phosphate     REACTION: vomiting   "

## 2024-02-26 ENCOUNTER — Ambulatory Visit: Payer: Self-pay | Admitting: Family

## 2024-03-04 ENCOUNTER — Ambulatory Visit: Admitting: Podiatry

## 2024-03-05 ENCOUNTER — Other Ambulatory Visit

## 2024-03-05 DIAGNOSIS — I10 Essential (primary) hypertension: Secondary | ICD-10-CM

## 2024-03-05 DIAGNOSIS — F411 Generalized anxiety disorder: Secondary | ICD-10-CM

## 2024-03-05 DIAGNOSIS — F331 Major depressive disorder, recurrent, moderate: Secondary | ICD-10-CM

## 2024-03-05 DIAGNOSIS — E785 Hyperlipidemia, unspecified: Secondary | ICD-10-CM

## 2024-03-05 DIAGNOSIS — E1165 Type 2 diabetes mellitus with hyperglycemia: Secondary | ICD-10-CM

## 2024-03-05 DIAGNOSIS — K2100 Gastro-esophageal reflux disease with esophagitis, without bleeding: Secondary | ICD-10-CM

## 2024-03-09 ENCOUNTER — Telehealth: Payer: Self-pay

## 2024-03-09 NOTE — Telephone Encounter (Signed)
FMLA form received

## 2024-03-10 NOTE — Telephone Encounter (Signed)
 Last visit 06/17/23, f/u in 1 month.   Per note from [employer] - Your leave usage indicates that you require absences beyond the healthcare provider's original estimate of the amount of leave needed.   Pt needs f/u OV, please reach out to assist with scheduling f/u visit with Dr. Joane for [facility] renewal.

## 2024-03-12 ENCOUNTER — Other Ambulatory Visit

## 2024-03-23 ENCOUNTER — Ambulatory Visit: Admitting: Family Medicine

## 2024-03-25 ENCOUNTER — Ambulatory Visit: Admitting: Podiatry

## 2024-05-24 ENCOUNTER — Ambulatory Visit: Admitting: Family
